# Patient Record
Sex: Male | Born: 1985 | Race: White | Hispanic: No | Marital: Married | Smoking: Current every day smoker
Health system: Southern US, Community
[De-identification: ages and names within clinical notes are randomized; demographics above are authoritative.]

## PROBLEM LIST (undated history)

## (undated) DIAGNOSIS — F419 Anxiety disorder, unspecified: Secondary | ICD-10-CM

## (undated) DIAGNOSIS — M1990 Unspecified osteoarthritis, unspecified site: Secondary | ICD-10-CM

## (undated) DIAGNOSIS — R51 Headache: Secondary | ICD-10-CM

## (undated) DIAGNOSIS — L0291 Cutaneous abscess, unspecified: Secondary | ICD-10-CM

## (undated) DIAGNOSIS — R519 Headache, unspecified: Secondary | ICD-10-CM

## (undated) DIAGNOSIS — Z22322 Carrier or suspected carrier of Methicillin resistant Staphylococcus aureus: Secondary | ICD-10-CM

## (undated) DIAGNOSIS — K4020 Bilateral inguinal hernia, without obstruction or gangrene, not specified as recurrent: Secondary | ICD-10-CM

## (undated) HISTORY — DX: Unspecified osteoarthritis, unspecified site: M19.90

## (undated) HISTORY — PX: NO PAST SURGERIES: SHX2092

## (undated) HISTORY — DX: Bilateral inguinal hernia, without obstruction or gangrene, not specified as recurrent: K40.20

---

## 2008-08-07 ENCOUNTER — Ambulatory Visit: Payer: Self-pay | Admitting: Internal Medicine

## 2008-08-07 DIAGNOSIS — M2550 Pain in unspecified joint: Secondary | ICD-10-CM | POA: Insufficient documentation

## 2008-08-07 LAB — CONVERTED CEMR LAB
ALT: 28 units/L (ref 0–53)
AST: 29 units/L (ref 0–37)
Albumin: 4.4 g/dL (ref 3.5–5.2)
BUN: 8 mg/dL (ref 6–23)
Basophils Relative: 0.6 % (ref 0.0–3.0)
Chloride: 102 meq/L (ref 96–112)
Creatinine, Ser: 0.9 mg/dL (ref 0.4–1.5)
Eosinophils Absolute: 0.3 10*3/uL (ref 0.0–0.7)
Eosinophils Relative: 4.5 % (ref 0.0–5.0)
GFR calc non Af Amer: 111 mL/min
Glucose, Bld: 96 mg/dL (ref 70–99)
HCT: 39.4 % (ref 39.0–52.0)
MCV: 93.2 fL (ref 78.0–100.0)
Monocytes Relative: 7.9 % (ref 3.0–12.0)
Neutrophils Relative %: 50.4 % (ref 43.0–77.0)
RBC: 4.22 M/uL (ref 4.22–5.81)
Rhuematoid fact SerPl-aCnc: <20 intl units/mL — ABNORMAL LOW (ref 0.0–20.0)
Total Protein: 6.8 g/dL (ref 6.0–8.3)
WBC: 5.8 10*3/uL (ref 4.5–10.5)

## 2008-09-04 ENCOUNTER — Ambulatory Visit: Payer: Self-pay | Admitting: Internal Medicine

## 2008-09-18 ENCOUNTER — Encounter: Payer: Self-pay | Admitting: Internal Medicine

## 2008-09-24 ENCOUNTER — Ambulatory Visit: Payer: Self-pay | Admitting: Internal Medicine

## 2008-09-24 DIAGNOSIS — J069 Acute upper respiratory infection, unspecified: Secondary | ICD-10-CM | POA: Insufficient documentation

## 2008-11-19 ENCOUNTER — Ambulatory Visit: Payer: Self-pay | Admitting: Internal Medicine

## 2008-11-19 DIAGNOSIS — F17200 Nicotine dependence, unspecified, uncomplicated: Secondary | ICD-10-CM | POA: Insufficient documentation

## 2009-03-04 ENCOUNTER — Ambulatory Visit: Payer: Self-pay | Admitting: Family Medicine

## 2009-04-11 ENCOUNTER — Ambulatory Visit: Payer: Self-pay | Admitting: Internal Medicine

## 2009-04-11 DIAGNOSIS — L255 Unspecified contact dermatitis due to plants, except food: Secondary | ICD-10-CM

## 2009-05-07 ENCOUNTER — Ambulatory Visit: Payer: Self-pay | Admitting: Internal Medicine

## 2009-05-07 DIAGNOSIS — G5600 Carpal tunnel syndrome, unspecified upper limb: Secondary | ICD-10-CM | POA: Insufficient documentation

## 2009-06-13 ENCOUNTER — Encounter (INDEPENDENT_AMBULATORY_CARE_PROVIDER_SITE_OTHER): Payer: Self-pay | Admitting: *Deleted

## 2009-06-13 ENCOUNTER — Ambulatory Visit: Payer: Self-pay | Admitting: Internal Medicine

## 2010-02-20 ENCOUNTER — Telehealth: Payer: Self-pay | Admitting: Internal Medicine

## 2010-11-25 NOTE — Progress Notes (Signed)
Summary: patches  Phone Note Call from Patient   Caller: Patient Call For: Gordy Savers  MD Summary of Call: Going on a cruise in may (10 days), and needs seasick patches [facility]) [phone] Initial call taken by: Lynann Beaver CMA,  February 20, 2010 9:12 AM  Follow-up for Phone Call        transderm scop  #3  use q 72 h Follow-up by: Gordy Savers  MD,  February 20, 2010 12:34 PM    New/Updated Medications: TRANSDERM-SCOP 1.5 MG PT72 (SCOPOLAMINE BASE) Apply q 72 hours Prescriptions: TRANSDERM-SCOP 1.5 MG PT72 (SCOPOLAMINE BASE) Apply q 72 hours  #3 x 0   Entered by:   Lynann Beaver CMA   Authorized by:   Gordy Savers  MD   Signed by:   Lynann Beaver CMA on 02/20/2010   Method used:   [facility] to        [facility] [phone]* (retail)       [address]       [place], [place]  [address]       Ph: [phone] or [phone]       Fax: [phone]   RxID:   [id number]

## 2011-10-03 ENCOUNTER — Emergency Department (HOSPITAL_COMMUNITY)
Admission: EM | Admit: 2011-10-03 | Discharge: 2011-10-03 | Disposition: A | Discharge status: Home / Self Care | Payer: BC Managed Care – PPO | Source: Home / Self Care

## 2011-10-03 DIAGNOSIS — L02219 Cutaneous abscess of trunk, unspecified: Secondary | ICD-10-CM

## 2011-10-03 MED ORDER — SULFAMETHOXAZOLE-TRIMETHOPRIM 800-160 MG PO TABS: 1.0000 | ORAL_TABLET | Freq: Two times a day (BID) | ORAL | Status: AC

## 2011-10-03 MED ORDER — HYDROCODONE-ACETAMINOPHEN 5-325 MG PO TABS: 1.0000 | ORAL_TABLET | ORAL | Status: AC | PRN

## 2011-10-03 NOTE — ED Provider Notes (Signed)
History     CSN: [id number] Arrival date & time: 10/03/2011  1:24 PM   None     Chief Complaint  Patient presents with  . Abscess    Pt has red painful lesion on lt side for three days, states he has these numerous times on various places on body    (Consider location/radiation/quality/duration/timing/severity/associated sxs/prior treatment) Patient is a 25 y.o. male presenting with abscess. The history is provided by the patient.  Abscess  This is a new problem. Episode onset: 2 days ago. The onset was gradual. The problem has been rapidly worsening. The abscess is present on the trunk. The problem is moderate. The abscess is characterized by redness and swelling. The abscess first occurred at home. Pertinent negatives include no fever. Sick contacts: someone else at work also has had abscess recently.    History reviewed. No pertinent past medical history.  History reviewed. No pertinent past surgical history.  History reviewed. No pertinent family history.  History  Substance Use Topics  . Smoking status: Current Everyday Smoker    Types: Cigarettes  . Smokeless tobacco: Not on file  . Alcohol Use: Yes      Review of Systems  Constitutional: Negative for fever and chills.  Skin: Positive for color change.       Abscess    Allergies  Review of patient's allergies indicates no known allergies.  Home Medications   Current Outpatient Rx  Name Route Sig Dispense Refill  . TYLENOL EXTRA STRENGTH PO Oral Take by mouth.      Marland Kitchen HYDROCODONE-ACETAMINOPHEN 5-325 MG PO TABS Oral Take 1-2 tablets by mouth every 4 (four) hours as needed for pain. 20 tablet 0  . SULFAMETHOXAZOLE-TRIMETHOPRIM 800-160 MG PO TABS Oral Take 1 tablet by mouth 2 (two) times daily. 20 tablet 0    BP 109/56  Pulse 73  Temp(Src) 98.3 F (36.8 C) (Oral)  Resp 17  SpO2 100%  Physical Exam  Constitutional: He appears well-developed and well-nourished. No distress.  Pulmonary/Chest: Effort  normal.  Skin: Skin is warm and dry.       ED Course  INCISION AND DRAINAGE Date/Time: 10/03/2011 2:10 PM Performed by: Cathlyn Parsons Authorized by: Cathlyn Parsons Consent: Verbal consent obtained. Written consent not obtained. Consent given by: patient Patient understanding: patient states understanding of the procedure being performed Patient identity confirmed: verbally with patient Type: abscess Body area: trunk Location details: chest Anesthesia: local infiltration Local anesthetic: lidocaine 2% without epinephrine Anesthetic total: 1 ml Patient sedated: no Scalpel size: 11 Incision type: single straight Complexity: simple Drainage: purulent Drainage amount: moderate Wound treatment: wound left open Packing material: 1/4 in iodoform gauze Patient tolerance: Patient tolerated the procedure well with no immediate complications.   (including critical care time)   Labs Reviewed  CULTURE, ROUTINE-ABSCESS   No results found.   1. Abscess of trunk       MDM          Cathlyn Parsons, NP 10/03/11 1423

## 2011-10-06 LAB — CULTURE, ROUTINE-ABSCESS: Special Requests: NORMAL

## 2011-10-07 ENCOUNTER — Telehealth (HOSPITAL_COMMUNITY): Payer: Self-pay | Admitting: *Deleted

## 2011-10-07 NOTE — ED Provider Notes (Signed)
Medical screening examination/treatment/procedure(s) were performed by non-physician practitioner and as supervising physician I was immediately available for consultation/collaboration.   Barkley Bruns MD.    Barkley Bruns, MD 10/07/11 2133

## 2011-10-07 NOTE — ED Notes (Signed)
Labs and medications for 12/8 reviewed.  Pt. pos. for MRSA and was adequately treated with Septra. Left message for pt. to call. Vassie Moselle 10/07/2011

## 2011-10-08 ENCOUNTER — Telehealth (HOSPITAL_COMMUNITY): Payer: Self-pay | Admitting: *Deleted

## 2011-10-09 ENCOUNTER — Telehealth (HOSPITAL_COMMUNITY): Payer: Self-pay | Admitting: *Deleted

## 2011-10-09 NOTE — ED Notes (Signed)
12/12 Labs and medications reviewed for 12/8.  Abscess culture L chest : Abundant MRSA. Adequately treated with Septra. Ryan Spencer 10/09/2011

## 2012-04-11 ENCOUNTER — Emergency Department (HOSPITAL_COMMUNITY)
Admission: EM | Admit: 2012-04-11 | Discharge: 2012-04-12 | Disposition: A | Discharge status: Home / Self Care | Payer: BC Managed Care – PPO | Attending: Emergency Medicine | Admitting: Emergency Medicine

## 2012-04-11 ENCOUNTER — Encounter (HOSPITAL_COMMUNITY): Payer: Self-pay | Admitting: *Deleted

## 2012-04-11 DIAGNOSIS — L02219 Cutaneous abscess of trunk, unspecified: Secondary | ICD-10-CM | POA: Insufficient documentation

## 2012-04-11 DIAGNOSIS — L0291 Cutaneous abscess, unspecified: Secondary | ICD-10-CM

## 2012-04-11 HISTORY — DX: Carrier or suspected carrier of methicillin resistant Staphylococcus aureus: Z22.322

## 2012-04-11 HISTORY — DX: Cutaneous abscess, unspecified: L02.91

## 2012-04-11 MED ORDER — ONDANSETRON HCL 4 MG/2ML IJ SOLN
4.0000 mg | Freq: Once | INTRAMUSCULAR | Status: AC
  Administered 2012-04-11: 4 mg via INTRAVENOUS
  Filled 2012-04-11: qty 2

## 2012-04-11 MED ORDER — CLINDAMYCIN PHOSPHATE 600 MG/50ML IV SOLN
600.0000 mg | Freq: Once | INTRAVENOUS | Status: AC
  Administered 2012-04-12: 600 mg via INTRAVENOUS
  Filled 2012-04-11: qty 50

## 2012-04-11 MED ORDER — MORPHINE SULFATE 4 MG/ML IJ SOLN
4.0000 mg | Freq: Once | INTRAMUSCULAR | Status: AC
  Administered 2012-04-11: 4 mg via INTRAVENOUS
  Filled 2012-04-11: qty 1

## 2012-04-11 MED ORDER — SODIUM CHLORIDE 0.9 % IV BOLUS (SEPSIS)
1000.0000 mL | Freq: Once | INTRAVENOUS | Status: AC
  Administered 2012-04-12: 1000 mL via INTRAVENOUS

## 2012-04-11 NOTE — ED Provider Notes (Signed)
History     CSN: [id number]  Arrival date & time 04/11/12  1927   First MD Initiated Contact with Patient 04/11/12 2252      Chief Complaint  Patient presents with  . Wound Infection    (Consider location/radiation/quality/duration/timing/severity/associated sxs/prior treatment) HPI  Patient presents to the ER with complaints of abdominal abscess. He was sent from the Urgent care who performed and I and D yesterday. He has been taking Doxycycline times 2 doses. When he came back to the UC to have the packing replaced, they noted increased redness and pt still have discharge from asbcess.. The patient denies increased pain, fever,s nausea, diarrhea, vomiting, abdominal pain (aside from abscess site).  Past Medical History  Diagnosis Date  . MRSA (methicillin resistant staph aureus) culture positive   . Abscess     No past surgical history on file.  No family history on file.  History  Substance Use Topics  . Smoking status: Not on file  . Smokeless tobacco: Not on file  . Alcohol Use:       Review of Systems   HEENT: denies blurry vision or change in hearing PULMONARY: Denies difficulty breathing and SOB CARDIAC: denies chest pain or heart palpitations MUSCULOSKELETAL:  denies being unable to ambulate ABDOMEN AL: denies abdominal pain GU: denies loss of bowel or urinary control NEURO: denies numbness and tingling in extremities SKIN: worsening rash PSYCH: patient behavior is normal NECK: Not complaining of neck pain     Allergies  Review of patient's allergies indicates no known allergies.  Home Medications   Current Outpatient Rx  Name Route Sig Dispense Refill  . DOXYCYCLINE HYCLATE 100 MG PO TABS Oral Take 100 mg by mouth 2 (two) times daily.    Marland Kitchen HYDROCODONE-ACETAMINOPHEN 5-325 MG PO TABS Oral Take 1 tablet by mouth every 6 (six) hours as needed. Pain    . ONDANSETRON HCL 4 MG PO TABS Oral Take 1 tablet (4 mg total) by mouth every 6 (six) hours. 12  tablet 0  . OXYCODONE-ACETAMINOPHEN 5-325 MG PO TABS Oral Take 1 tablet by mouth every 6 (six) hours as needed for pain. 15 tablet 0    BP 128/62  Pulse 68  Temp 98.7 F (37.1 C) (Oral)  Resp 16  Ht 5\' 11"  (1.803 m)  Wt 155 lb (70.308 kg)  BMI 21.62 kg/m2  SpO2 99%  Physical Exam  Nursing note and vitals reviewed. Constitutional: He appears well-developed and well-nourished. No distress.  HENT:  Head: Normocephalic and atraumatic.  Eyes: Pupils are equal, round, and reactive to light.  Neck: Normal range of motion. Neck supple.  Cardiovascular: Normal rate and regular rhythm.   Pulmonary/Chest: Effort normal.  Abdominal: Soft.  Neurological: He is alert.  Skin: Skin is warm and dry. Rash noted.          Patient has abscess to LLQ abdomen. Patient has had previous I&D but incision was very small and not large enough for the size of the abscess.  The patient also has surrounding cellulitis that is mild.     ED Course  Procedures (including critical care time)  Labs Reviewed  CBC - Abnormal; Notable for the following:    RBC 3.88 (*)     Hemoglobin 12.0 (*)     HCT 35.9 (*)     All other components within normal limits  BASIC METABOLIC PANEL - Abnormal; Notable for the following:    Glucose, Bld 100 (*)     All other  components within normal limits  DIFFERENTIAL   No results found.   1. Abscess       MDM    INCISION AND DRAINAGE Performed by: Dorthula Matas Consent: Verbal consent obtained. Risks and benefits: risks, benefits and alternatives were discussed Type: abscess  Body area: LLQ abdomen  Anesthesia: local infiltration  Local anesthetic: lidocaine 2% with epinephrine  Anesthetic total: 3 ml  Complexity: complex Blunt dissection to break up loculations  Drainage: purulent  Drainage amount: large amount  Packing material: 1/4 in iodoform gauze  Patient tolerance: Patient tolerated the procedure well with no immediate  complications.   Patient given IV abx of Clindamycin in ED he is taking Doxycyline at home and has only taken it for one day. Therefore I think the patient can continue to take it.  I am having the patient return in 48 hours for packing change or sooner if redness spreads.   Pt has been advised of the symptoms that warrant their return to the ED. Patient has voiced understanding and has agreed to follow-up with the PCP or specialist.         Dorthula Matas, PA 04/12/12 [phone]

## 2012-04-11 NOTE — ED Notes (Signed)
Pt here s/p I&D performed yesterday at [facility] on LLQ of abd. Pt reports today large amt of drainage and increased redness to wound site, pt went back to [facility] today, staff took out packing and was referred to ED for further eval. Pt also admits to increased abd pain/left hip pain. Pt denies any fever, n/v - however does admit to some weakness.

## 2012-04-12 LAB — BASIC METABOLIC PANEL
CO2: 28 mEq/L (ref 19–32)
Calcium: 9.5 mg/dL (ref 8.4–10.5)
GFR calc non Af Amer: >90 mL/min (ref >90)
Sodium: 138 mEq/L (ref 135–145)

## 2012-04-12 LAB — CBC
MCV: 92.5 fL (ref 78.0–100.0)
Platelets: 195 10*3/uL (ref 150–400)
RDW: 13.3 % (ref 11.5–15.5)
WBC: 8.4 10*3/uL (ref 4.0–10.5)

## 2012-04-12 LAB — DIFFERENTIAL
Basophils Absolute: 0 10*3/uL (ref 0.0–0.1)
Eosinophils Relative: 3 % (ref 0–5)
Lymphocytes Relative: 37 % (ref 12–46)
Neutrophils Relative %: 50 % (ref 43–77)

## 2012-04-12 MED ORDER — OXYCODONE-ACETAMINOPHEN 5-325 MG PO TABS: 1.0000 | ORAL_TABLET | Freq: Four times a day (QID) | ORAL | Status: DC | PRN

## 2012-04-12 MED ORDER — ONDANSETRON HCL 4 MG PO TABS: 4.0000 mg | ORAL_TABLET | Freq: Four times a day (QID) | ORAL | Status: DC

## 2012-04-12 MED ORDER — OXYCODONE-ACETAMINOPHEN 5-325 MG PO TABS
1.0000 | ORAL_TABLET | Freq: Once | ORAL | Status: AC
  Administered 2012-04-12: 1 via ORAL
  Filled 2012-04-12: qty 1

## 2012-04-12 NOTE — ED Provider Notes (Signed)
Medical screening examination/treatment/procedure(s) were performed by non-physician practitioner and as supervising physician I was immediately available for consultation/collaboration.   Andy Allende L Letasha Kershaw, MD 04/12/12 0827 

## 2012-04-12 NOTE — Discharge Instructions (Signed)

## 2012-04-14 ENCOUNTER — Emergency Department (HOSPITAL_COMMUNITY)
Admission: EM | Admit: 2012-04-14 | Discharge: 2012-04-14 | Disposition: A | Discharge status: Home / Self Care | Payer: BC Managed Care – PPO | Attending: Emergency Medicine | Admitting: Emergency Medicine

## 2012-04-14 ENCOUNTER — Encounter (HOSPITAL_COMMUNITY): Payer: Self-pay | Admitting: Emergency Medicine

## 2012-04-14 DIAGNOSIS — L02219 Cutaneous abscess of trunk, unspecified: Secondary | ICD-10-CM | POA: Insufficient documentation

## 2012-04-14 DIAGNOSIS — Z8614 Personal history of Methicillin resistant Staphylococcus aureus infection: Secondary | ICD-10-CM | POA: Insufficient documentation

## 2012-04-14 DIAGNOSIS — L02211 Cutaneous abscess of abdominal wall: Secondary | ICD-10-CM

## 2012-04-14 DIAGNOSIS — L03319 Cellulitis of trunk, unspecified: Secondary | ICD-10-CM | POA: Insufficient documentation

## 2012-04-14 NOTE — ED Notes (Signed)
Pt states he had an abominal abscess drained on Monday and is here for a recheck and packing removal today

## 2012-04-14 NOTE — ED Notes (Signed)
PA Kirichenko at bedside 

## 2012-04-14 NOTE — ED Provider Notes (Signed)
History     CSN: [id number]  Arrival date & time 04/14/12  1191   First MD Initiated Contact with Patient 04/14/12 [phone]      Chief Complaint  Patient presents with  . Wound Check    (Consider location/radiation/quality/duration/timing/severity/associated sxs/prior treatment) Patient is a 26 y.o. male presenting with wound check. The history is provided by the patient.  Wound Check  He was treated in the ED 2 to 3 days ago. Previous treatment in the ED includes I&D of abscess. Treatments since wound repair include oral antibiotics. There has been bloody discharge from the wound. The redness has improved. The swelling has improved. The pain has improved.  Pt had an abscess I&Ded two days ago, was told to return for recheck and packing removal. Pt states abscess feeling better, and surrounding redness and pain almost resolved. Pt has hx of abscess MRSA positive. Pt denies fever, chills, malaise.   Past Medical History  Diagnosis Date  . MRSA (methicillin resistant staph aureus) culture positive   . Abscess     History reviewed. No pertinent past surgical history.  History reviewed. No pertinent family history.  History  Substance Use Topics  . Smoking status: Not on file  . Smokeless tobacco: Not on file  . Alcohol Use: No      Review of Systems  Constitutional: Negative for fever and chills.  Respiratory: Negative.   Cardiovascular: Negative.   Skin: Positive for wound.  Neurological: Negative for dizziness, light-headedness and headaches.    Allergies  Review of patient's allergies indicates no known allergies.  Home Medications   Current Outpatient Rx  Name Route Sig Dispense Refill  . DOXYCYCLINE HYCLATE 100 MG PO TABS Oral Take 100 mg by mouth 2 (two) times daily.    Marland Kitchen HYDROCODONE-ACETAMINOPHEN 5-325 MG PO TABS Oral Take 1 tablet by mouth every 6 (six) hours as needed. Pain      BP 113/57  Pulse 54  Temp 98.1 F (36.7 C) (Oral)  Resp 16  SpO2  100%  Physical Exam  Nursing note and vitals reviewed. Constitutional: He appears well-developed and well-nourished. No distress.  HENT:  Head: Normocephalic.  Eyes: Conjunctivae are normal.  Cardiovascular: Normal rate, regular rhythm and normal heart sounds.   Pulmonary/Chest: Effort normal and breath sounds normal. No respiratory distress. He has no wheezes. He has no rales.  Neurological: He is alert.  Skin:       There is about a 2cm incision to the left lower abdomen over a previous abscess. Packing intact. Some serosanguinous drainage noted. No surrounding cellulitis or induration. Mild tenderness with palpation    ED Course  Procedures (including critical care time)  Pt with an abscess I&Ded and packed two days ago. On doxycycline. States it is improving. I removed the packing, which was pretty dense in the wound, tissue appears healthy, no purulent drainage noted. Abscess quiet deep, about 2cm deep. I then packed it lightly with about 2cm of packing maetrial, dressing applied. Instructed pt to continue antibiotics, follow up as needed. He can return here in 48hrs, follow up with his doctor, or pull packing out himself.   Pt afebrile, non toxic appearing, ready for discharge.   1. Cutaneous abscess of abdominal wall       MDM          Lottie Mussel, PA 04/14/12 [phone]

## 2012-04-14 NOTE — Discharge Instructions (Signed)
Continue to keep area clean. Continue antibiotics. You can do warm compresses several times a day. In 48hrs, you can return for follow up or remove packing yourself. Follow  Up as needed.   Abscess Care After An abscess (also called a boil or furuncle) is an infected area that contains a collection of pus. Signs and symptoms of an abscess include pain, tenderness, redness, or hardness, or you may feel a moveable soft area under your skin. An abscess can occur anywhere in the body. The infection may spread to surrounding tissues causing cellulitis. A cut (incision) by the surgeon was made over your abscess and the pus was drained out. Gauze may have been packed into the space to provide a drain that will allow the cavity to heal from the inside outwards. The boil may be painful for 5 to 7 days. Most people with a boil do not have high fevers. Your abscess, if seen early, may not have localized, and may not have been lanced. If not, another appointment may be required for this if it does not get better on its own or with medications. HOME CARE INSTRUCTIONS   Only take over-the-counter or prescription medicines for pain, discomfort, or fever as directed by your caregiver.   When you bathe, soak and then remove gauze or iodoform packs at least daily or as directed by your caregiver. You may then wash the wound gently with mild soapy water. Repack with gauze or do as your caregiver directs.  SEEK IMMEDIATE MEDICAL CARE IF:   You develop increased pain, swelling, redness, drainage, or bleeding in the wound site.   You develop signs of generalized infection including muscle aches, chills, fever, or a general ill feeling.   An oral temperature above 102 F (38.9 C) develops, not controlled by medication.  See your caregiver for a recheck if you develop any of the symptoms described above. If medications (antibiotics) were prescribed, take them as directed. Document Released: 04/30/2005 Document Revised:  10/01/2011 Document Reviewed: 12/26/2007 [facility] Patient Information 2012 [place], [place].

## 2012-04-14 NOTE — ED Provider Notes (Signed)
Medical screening examination/treatment/procedure(s) were performed by non-physician practitioner and as supervising physician I was immediately available for consultation/collaboration.   Celene Kras, MD 04/14/12 [phone]

## 2012-04-14 NOTE — ED Notes (Signed)
Pt here for re-check and packing removal to abcess in mid lower abd. Pt states the area is less swollen now. Pt states the area is still a little painful. Area red with some swelling. Wound covered with abdo pad.

## 2012-05-27 ENCOUNTER — Encounter: Payer: Self-pay | Admitting: Internal Medicine

## 2012-05-27 ENCOUNTER — Ambulatory Visit (INDEPENDENT_AMBULATORY_CARE_PROVIDER_SITE_OTHER): Payer: BC Managed Care – PPO | Admitting: Internal Medicine

## 2012-05-27 VITALS — BP 118/74 | Temp 98.7°F | Wt 151.0 lb

## 2012-05-27 DIAGNOSIS — L0390 Cellulitis, unspecified: Secondary | ICD-10-CM

## 2012-05-27 DIAGNOSIS — L0291 Cutaneous abscess, unspecified: Secondary | ICD-10-CM

## 2012-05-27 NOTE — Patient Instructions (Signed)
Abscess  Care After  An abscess (also called a boil or furuncle) is an infected area that contains a collection of pus. Signs and symptoms of an abscess include pain, tenderness, redness, or hardness, or you may feel a moveable soft area under your skin. An abscess can occur anywhere in the body. The infection may spread to surrounding tissues causing cellulitis. A cut (incision) by the surgeon was made over your abscess and the pus was drained out. Gauze may have been packed into the space to provide a drain that will allow the cavity to heal from the inside outwards. The boil may be painful for 5 to 7 days. Most people with a boil do not have high fevers. Your abscess, if seen early, may not have localized, and may not have been lanced. If not, another appointment may be required for this if it does not get better on its own or with medications.  HOME CARE INSTRUCTIONS     Only take over-the-counter or prescription medicines for pain, discomfort, or fever as directed by your caregiver.    When you bathe, soak and then remove gauze or iodoform packs at least daily or as directed by your caregiver. You may then wash the wound gently with mild soapy water. Repack with gauze or do as your caregiver directs.   SEEK IMMEDIATE MEDICAL CARE IF:     You develop increased pain, swelling, redness, drainage, or bleeding in the wound site.    You develop signs of generalized infection including muscle aches, chills, fever, or a general ill feeling.    An oral temperature above 102 F (38.9 C) develops, not controlled by medication.   See your caregiver for a recheck if you develop any of the symptoms described above. If medications (antibiotics) were prescribed, take them as directed.  Document Released: 04/30/2005 Document Revised: 10/01/2011 Document Reviewed: 12/26/2007  [employer] Patient Information 2012 [employer].

## 2012-05-27 NOTE — Progress Notes (Signed)
  Subjective:    Patient ID: Ryan Spencer, male    DOB: 12-Oct-1986, 26 y.o.   MRN: [id number]  HPI 26 year old patient who is seen today with a chief complaint of an enlarging abscess involving his left anterior thigh. He has required I&D in the past and does have a history of MRSA. For the past 2 days he has been using doxycycline which he he had left over from a prior episode. Due to the increasing pain and swelling he presented for evaluation today     Review of Systems  Skin: Positive for wound.       Objective:   Physical Exam  Skin: Skin is warm. Rash noted. There is erythema.       A 4 cm erythematous tender furuncle  noted left mid anterior thigh          Assessment & Plan:   Abscess cellulitis left anterior thigh.  Procedure note. After Betadine prep and local anesthesia with 1% Xylocaine a 2 cm incision was made and the abscess drained; blunt dissection was undertaken; antibiotic ointment applied and the wound dressed. Local wound care discussed

## 2012-05-29 ENCOUNTER — Encounter (HOSPITAL_COMMUNITY): Payer: Self-pay

## 2012-05-29 ENCOUNTER — Emergency Department (HOSPITAL_COMMUNITY)
Admission: EM | Admit: 2012-05-29 | Discharge: 2012-05-29 | Disposition: A | Discharge status: Home / Self Care | Payer: BC Managed Care – PPO | Source: Home / Self Care | Attending: Emergency Medicine | Admitting: Emergency Medicine

## 2012-05-29 DIAGNOSIS — L0390 Cellulitis, unspecified: Secondary | ICD-10-CM

## 2012-05-29 DIAGNOSIS — L0291 Cutaneous abscess, unspecified: Secondary | ICD-10-CM

## 2012-05-29 MED ORDER — MUPIROCIN CALCIUM 2 % NA OINT: TOPICAL_OINTMENT | NASAL | Status: DC

## 2012-05-29 MED ORDER — HYDROCODONE-ACETAMINOPHEN 5-325 MG PO TABS: 1.0000 | ORAL_TABLET | Freq: Four times a day (QID) | ORAL | Status: DC | PRN

## 2012-05-29 MED ORDER — SULFAMETHOXAZOLE-TRIMETHOPRIM 800-160 MG PO TABS: 1.0000 | ORAL_TABLET | Freq: Two times a day (BID) | ORAL | Status: AC

## 2012-05-29 MED ORDER — HYDROCODONE-ACETAMINOPHEN 5-325 MG PO TABS
ORAL_TABLET | ORAL | Status: AC
  Filled 2012-05-29: qty 2

## 2012-05-29 MED ORDER — HYDROCODONE-ACETAMINOPHEN 5-325 MG PO TABS
2.0000 | ORAL_TABLET | Freq: Once | ORAL | Status: AC
  Administered 2012-05-29: 2 via ORAL

## 2012-05-29 NOTE — ED Notes (Signed)
abcess on left upper thigh, states went to doctor on Friday and had and I&D. Was not given antibiotics. Pt states has increase in redness, pain, fever and chills

## 2012-05-29 NOTE — ED Provider Notes (Signed)
History     CSN: [id number]  Arrival date & time 05/29/12  1410   None     Chief Complaint  Patient presents with  . Wound Infection    leg abcess    (Consider location/radiation/quality/duration/timing/severity/associated sxs/prior treatment) HPI Comments: Pt with recurrent abscesses in varying locations for last year.  Latest started on L thigh 4 days ago. Saw pcp 2 days ago who I&D abscess, no rx for antibiotics, no packing.  Pt reports area getting worse, continues to drain pus, pt c/o fever.   Patient is a 26 y.o. male presenting with abscess. The history is provided by the patient.  Abscess  This is a new problem. Episode onset: 4 days ago. The problem occurs continuously. The problem has been rapidly worsening. The abscess is present on the left upper leg. The problem is severe. The abscess is characterized by redness, swelling, draining and painfulness. It is unknown what he was exposed to. Associated symptoms include a fever.    Past Medical History  Diagnosis Date  . MRSA (methicillin resistant staph aureus) culture positive   . Abscess     History reviewed. No pertinent past surgical history.  Family History  Problem Relation Age of Onset  . Arthritis Mother     History  Substance Use Topics  . Smoking status: Current Everyday Smoker    Types: Cigarettes  . Smokeless tobacco: Not on file  . Alcohol Use: No      Review of Systems  Constitutional: Positive for fever.  Skin: Positive for color change and wound.    Allergies  Review of patient's allergies indicates no known allergies.  Home Medications   Current Outpatient Rx  Name Route Sig Dispense Refill  . HYDROCODONE-ACETAMINOPHEN 5-325 MG PO TABS Oral Take 1 tablet by mouth every 6 (six) hours as needed for pain. 30 tablet 0  . MUPIROCIN CALCIUM 2 % NA OINT  Apply in each nostril daily for 30 days 1 g 0  . SULFAMETHOXAZOLE-TRIMETHOPRIM 800-160 MG PO TABS Oral Take 1 tablet by mouth every 12  (twelve) hours. 20 tablet 0    BP 108/44  Pulse 62  Temp 99.4 F (37.4 C) (Oral)  Resp 18  SpO2 100%  Physical Exam  Constitutional: He appears well-developed and well-nourished. No distress.  Pulmonary/Chest: Effort normal.  Skin: Skin is warm and dry. There is erythema.          Wound irrigated with saline, more pus expressed, irrigation repeated until no further pus produced.  Wound packed with 1/4" iodoform gauze and covered with gauze and tape.     ED Course  Procedures (including critical care time)  Labs Reviewed - No data to display No results found.   1. Abscess       MDM  Pt to f/u with pcp for wound check. Discussed with pt how he has been getting recurrent abscesses in last year, and need for nasal bactroban tx.         Cathlyn Parsons, NP 06/03/12 2103

## 2012-05-30 ENCOUNTER — Telehealth: Payer: Self-pay | Admitting: Internal Medicine

## 2012-05-30 NOTE — Telephone Encounter (Signed)
Faxed note for friday appt.

## 2012-05-30 NOTE — Telephone Encounter (Signed)
Pt needs work note stating he was at doctor office on 05-27-2012. Please fax to attn [facility] [phone]

## 2012-05-31 ENCOUNTER — Ambulatory Visit (INDEPENDENT_AMBULATORY_CARE_PROVIDER_SITE_OTHER): Payer: BC Managed Care – PPO | Admitting: Internal Medicine

## 2012-05-31 ENCOUNTER — Encounter: Payer: Self-pay | Admitting: Internal Medicine

## 2012-05-31 VITALS — BP 100/62 | Temp 98.1°F | Wt 150.0 lb

## 2012-05-31 DIAGNOSIS — L0291 Cutaneous abscess, unspecified: Secondary | ICD-10-CM

## 2012-05-31 MED ORDER — HYDROCODONE-ACETAMINOPHEN 5-325 MG PO TABS: 1.0000 | ORAL_TABLET | Freq: Four times a day (QID) | ORAL | Status: DC | PRN

## 2012-05-31 NOTE — Patient Instructions (Addendum)
Leave packing in place as long as possible  Take your antibiotic as prescribed until ALL of it is gone, but stop if you develop a rash, swelling, or any side effects of the medication.  Contact our office as soon as possible if  there are side effects of the medication.  Call or return to clinic prn if these symptoms worsen or fail to improve as anticipated.   Abscess Care After An abscess (also called a boil or furuncle) is an infected area that contains a collection of pus. Signs and symptoms of an abscess include pain, tenderness, redness, or hardness, or you may feel a moveable soft area under your skin. An abscess can occur anywhere in the body. The infection may spread to surrounding tissues causing cellulitis. A cut (incision) by the surgeon was made over your abscess and the pus was drained out. Gauze may have been packed into the space to provide a drain that will allow the cavity to heal from the inside outwards. The boil may be painful for 5 to 7 days. Most people with a boil do not have high fevers. Your abscess, if seen early, may not have localized, and may not have been lanced. If not, another appointment may be required for this if it does not get better on its own or with medications. HOME CARE INSTRUCTIONS    Only take over-the-counter or prescription medicines for pain, discomfort, or fever as directed by your caregiver.   When you bathe, soak and then remove gauze or iodoform packs at least daily or as directed by your caregiver. You may then wash the wound gently with mild soapy water. Repack with gauze or do as your caregiver directs.  SEEK IMMEDIATE MEDICAL CARE IF:    You develop increased pain, swelling, redness, drainage, or bleeding in the wound site.   You develop signs of generalized infection including muscle aches, chills, fever, or a general ill feeling.   An oral temperature above 102 F (38.9 C) develops, not controlled by medication.  See your caregiver for a  recheck if you develop any of the symptoms described above. If medications (antibiotics) were prescribed, take them as directed. Document Released: 04/30/2005 Document Revised: 10/01/2011 Document Reviewed: 12/26/2007 [facility] Patient Information 2012 [place], [place].

## 2012-05-31 NOTE — Progress Notes (Signed)
  Subjective:    Patient ID: Ryan Spencer, male    DOB: 1986/09/05, 26 y.o.   MRN: [id number]  HPI  26 year old patient who is seen today for followup of a left anterior thigh abscess. He was seen here 4 days ago and this abscess was I&D. It was felt that he had doxycycline on hand but apparently this was amoxicillin. 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 Due to worsening pain and swelling he was seen at the urgent care 2 days ago and the wound was packed. He continues to have drainage from the site. Antibiotic therapy has been switched to Septra DS. Culture report pending. He is still somewhat uncomfortable but the erythema has improved.     Review of Systems  Skin: Positive for wound.       Objective:   Physical Exam  Skin:       There is only minimal erythema about the wound involving the left anterior thigh area. Packing still in place          Assessment & Plan:   Abscess left anterior thigh. History of MRSA. The packing was left in place. Wound care discussed. He'll complete antibiotic therapy. Will review results of the culture when available. Additional analgesics prescribed

## 2012-06-05 NOTE — ED Provider Notes (Signed)
Medical screening examination/treatment/procedure(s) were performed by non-physician practitioner and as supervising physician I was immediately available for consultation/collaboration.  Luiz Blare MD   Luiz Blare, MD 06/05/12 [phone]

## 2012-06-28 ENCOUNTER — Telehealth: Payer: Self-pay | Admitting: Family Medicine

## 2012-06-28 ENCOUNTER — Telehealth: Payer: Self-pay

## 2012-06-28 MED ORDER — CEPHALEXIN 500 MG PO CAPS: 500.0000 mg | ORAL_CAPSULE | Freq: Four times a day (QID) | ORAL | Status: AC

## 2012-06-28 MED ORDER — TRAMADOL HCL 50 MG PO TABS: 50.0000 mg | ORAL_TABLET | Freq: Four times a day (QID) | ORAL | Status: AC | PRN

## 2012-06-28 NOTE — Telephone Encounter (Signed)
Tramadol 50  #50  One every 6 hrs

## 2012-06-28 NOTE — Telephone Encounter (Signed)
Generic Keflex 500 #40 one 4 times a day. Office visit if there is not prompt improvement

## 2012-06-28 NOTE — Telephone Encounter (Signed)
Pt was in to see Dr. Kirtland Bouchard for thigh abscess in August. Now it seems he has one on his L foot. Has called Call a Nurse 3x today, with no call back. Wants to know if he needs appt, or if Dr. Kirtland Bouchard could call in abx first, to start. He is trying to miss as little work as possible. Please call to advise.

## 2012-06-28 NOTE — Telephone Encounter (Signed)
Please advise 

## 2012-06-28 NOTE — Telephone Encounter (Signed)
Attempt to call- VM - LMTCB if questions - left instructions that med is to be taken qid for 10day - must complete therapy - if in 5 days no inprovment - must be seen - if in 10 days not completely well - will need to be seen - it is very important to take med as rx'd .

## 2012-06-28 NOTE — Telephone Encounter (Signed)
abx called in for foot infection , pt aware how to take and if no improvement we are to see him . He is requesting some thing for pain since he has to work on his feet - please advise

## 2012-06-30 ENCOUNTER — Ambulatory Visit (INDEPENDENT_AMBULATORY_CARE_PROVIDER_SITE_OTHER): Payer: BC Managed Care – PPO | Admitting: Internal Medicine

## 2012-06-30 ENCOUNTER — Encounter: Payer: Self-pay | Admitting: Internal Medicine

## 2012-06-30 ENCOUNTER — Telehealth: Payer: Self-pay | Admitting: Internal Medicine

## 2012-06-30 VITALS — Temp 98.6°F | Wt 150.0 lb

## 2012-06-30 DIAGNOSIS — L02619 Cutaneous abscess of unspecified foot: Secondary | ICD-10-CM

## 2012-06-30 DIAGNOSIS — L03119 Cellulitis of unspecified part of limb: Secondary | ICD-10-CM

## 2012-06-30 MED ORDER — HYDROCODONE-ACETAMINOPHEN 5-500 MG PO TABS: 1.0000 | ORAL_TABLET | Freq: Three times a day (TID) | ORAL | Status: AC | PRN

## 2012-06-30 MED ORDER — MOXIFLOXACIN HCL 400 MG PO TABS: 400.0000 mg | ORAL_TABLET | Freq: Every day | ORAL | Status: AC

## 2012-06-30 NOTE — Telephone Encounter (Signed)
Left a message for pt to return call 

## 2012-06-30 NOTE — Telephone Encounter (Signed)
Can pt come in at 430pm today? Will work in at that time

## 2012-06-30 NOTE — Telephone Encounter (Signed)
Pt aware of appt at 4:30 on 06/30/12.

## 2012-06-30 NOTE — Patient Instructions (Signed)
Antibiotic therapy was prescribed for the child due to specific medical indications.  Cellulitis Cellulitis is an infection of the skin and the tissue beneath it. The area is typically red and tender. It is caused by germs (bacteria) (usually staph or strep) that enter the body through cuts or sores. Cellulitis most commonly occurs in the arms or lower legs.   HOME CARE INSTRUCTIONS    If you are given a prescription for medications which kill germs (antibiotics), take as directed until finished.   If the infection is on the arm or leg, keep the limb elevated as able.   Use a warm cloth several times per day to relieve pain and encourage healing.   See your caregiver for recheck of the infected site as directed if problems arise.   Only take over-the-counter or prescription medicines for pain, discomfort, or fever as directed by your caregiver.  SEEK MEDICAL CARE IF:    The area of redness (inflammation) is spreading, there are red streaks coming from the infected site, or if a part of the infection begins to turn dark in color.   The joint or bone underneath the infected skin becomes painful after the skin has healed.   The infection returns in the same or another area after it seems to have gone away.   A boil or bump swells up. This may be an abscess.   New, unexplained problems such as pain or fever develop.  SEEK IMMEDIATE MEDICAL CARE IF:    You have a fever.   You or your child feels drowsy or lethargic.   There is vomiting, diarrhea, or lasting discomfort or feeling ill (malaise) with muscle aches and pains.  MAKE SURE YOU:    Understand these instructions.   Will watch your condition.   Will get help right away if you are not doing well or get worse.  Document Released: 07/22/2005 Document Revised: 10/01/2011 Document Reviewed: 05/30/2008 [facility] Patient Information 2012 [place], [place].Cellulitis Cellulitis is an infection of the skin and the tissue beneath it.  The area is typically red and tender. It is caused by germs (bacteria) (usually staph or strep) that enter the body through cuts or sores. Cellulitis most commonly occurs in the arms or lower legs.   HOME CARE INSTRUCTIONS    If you are given a prescription for medications which kill germs (antibiotics), take as directed until finished.   If the infection is on the arm or leg, keep the limb elevated as able.   Use a warm cloth several times per day to relieve pain and encourage healing.   See your caregiver for recheck of the infected site as directed if problems arise.   Only take over-the-counter or prescription medicines for pain, discomfort, or fever as directed by your caregiver.  SEEK MEDICAL CARE IF:    The area of redness (inflammation) is spreading, there are red streaks coming from the infected site, or if a part of the infection begins to turn dark in color.   The joint or bone underneath the infected skin becomes painful after the skin has healed.   The infection returns in the same or another area after it seems to have gone away.   A boil or bump swells up. This may be an abscess.   New, unexplained problems such as pain or fever develop.  SEEK IMMEDIATE MEDICAL CARE IF:    You have a fever.   You or your child feels drowsy or lethargic.   There  is vomiting, diarrhea, or lasting discomfort or feeling ill (malaise) with muscle aches and pains.  MAKE SURE YOU:    Understand these instructions.   Will watch your condition.   Will get help right away if you are not doing well or get worse.  Document Released: 07/22/2005 Document Revised: 10/01/2011 Document Reviewed: 05/30/2008 [facility] Patient Information 2012 [place], [place].

## 2012-06-30 NOTE — Progress Notes (Signed)
  Subjective:    Patient ID: Ryan Spencer, male    DOB: August 25, 1986, 26 y.o.   MRN: [id number]  HPI  26 year old patient who has a history of prior skin and soft tissue infections this has included a recent abscess involving his thigh area. He does have a prior history of MSRA. The past several weeks he has had some dry flaky skin over the dorsal aspect of his left foot. For the past several days he has had the onset of increasing pain swelling and drainage.  Past Medical History  Diagnosis Date  . MRSA (methicillin resistant staph aureus) culture positive   . Abscess     History   Social History  . Marital Status: Married    Spouse Name: N/A    Number of Children: N/A  . Years of Education: N/A   Occupational History  . Not on file.   Social History Main Topics  . Smoking status: Current Everyday Smoker    Types: Cigarettes  . Smokeless tobacco: Not on file  . Alcohol Use: No  . Drug Use: No  . Sexually Active: Not on file   Other Topics Concern  . Not on file   Social History Narrative   ** Merged History Encounter **     No past surgical history on file.  Family History  Problem Relation Age of Onset  . Arthritis Mother     No Known Allergies  Current Outpatient Prescriptions on File Prior to Visit  Medication Sig Dispense Refill  . cephALEXin (KEFLEX) 500 MG capsule Take 1 capsule (500 mg total) by mouth 4 (four) times daily.  40 capsule  0  . traMADol (ULTRAM) 50 MG tablet Take 1 tablet (50 mg total) by mouth every 6 (six) hours as needed for pain.  50 tablet  0  . mupirocin nasal ointment (BACTROBAN) 2 % Apply in each nostril daily for 30 days  1 g  0    Temp 98.6 F (37 C) (Oral)  Wt 150 lb (68.04 kg)       Review of Systems  Skin: Positive for rash.       Objective:   Physical Exam  Skin:       The patient has some dry scaly skin involving the dorsal aspect of his left foot just medial to this area, was a region of erythema slight warmth  and some serosanguineous drainage. There is a wider area of the mild erythema and excessive warmth          Assessment & Plan:   Cellulitis left foot. The patient retreated with Avelox 400 mg daily for 10 days samples were provided. Wound care discussed. Will call if unimproved

## 2012-06-30 NOTE — Telephone Encounter (Signed)
Caller: [name]/Patient; Patient Name: Ryan Spencer; PCP: Eleonore Chiqu[facility]); Best Callback Phone Number: [phone]; Call regarding Headache after taking Tramadol on 9-3 for Foot Abscess pain.  Patient denies Headache at time of call.  Patient's foot pain not relieved by Acetaminophen or Ibuprofen.  Foot is warm to touch, swelling, pain and redness, symptoms same since starting antibiotics on 9-3. Patient is doing warm compressess to help drain abscess, no drainage yet.  All emergent symptoms ruled out per Foot non-injury Protocol, see in 4 hours due to symptoms of infection that has not improved with 24 hours of home care/antibiotics.  No appointments available with Dr Amador Cunas, Please allow Patient time to schedule off from work today, 9-5, before coming in to appointment.

## 2012-08-02 ENCOUNTER — Ambulatory Visit (INDEPENDENT_AMBULATORY_CARE_PROVIDER_SITE_OTHER): Payer: BC Managed Care – PPO | Admitting: Internal Medicine

## 2012-08-02 ENCOUNTER — Encounter: Payer: Self-pay | Admitting: Internal Medicine

## 2012-08-02 VITALS — BP 130/76 | Temp 98.2°F | Wt 152.0 lb

## 2012-08-02 DIAGNOSIS — M25562 Pain in left knee: Secondary | ICD-10-CM

## 2012-08-02 DIAGNOSIS — F17200 Nicotine dependence, unspecified, uncomplicated: Secondary | ICD-10-CM

## 2012-08-02 DIAGNOSIS — M25569 Pain in unspecified knee: Secondary | ICD-10-CM

## 2012-08-02 NOTE — Progress Notes (Signed)
  Subjective:    Patient ID: Ryan Spencer, male    DOB: 1986/08/25, 26 y.o.   MRN: [id number]  HPI  26 year old patient who presents today with a chief complaint of left knee pain. Approximately 2 months ago he was doing weight training and performing squats. He fell traumatizing his left knee and had immediate pain that lasted for approximately one week. He may have had some slight swelling associated with the injury. Pain resolved but reoccurred 2-3 weeks ago and now has become progressively worse. Initially pain was present only towards the end of the day but now pain is more severe and occurs the early in his workday. He has been using Aleve without much benefit.  Past Medical History  Diagnosis Date  . MRSA (methicillin resistant staph aureus) culture positive   . Abscess     History   Social History  . Marital Status: Married    Spouse Name: N/A    Number of Children: N/A  . Years of Education: N/A   Occupational History  . Not on file.   Social History Main Topics  . Smoking status: Current Every Day Smoker    Types: Cigarettes  . Smokeless tobacco: Never Used  . Alcohol Use: No  . Drug Use: No  . Sexually Active: Not on file   Other Topics Concern  . Not on file   Social History Narrative   ** Merged History Encounter **     No past surgical history on file.  Family History  Problem Relation Age of Onset  . Arthritis Mother     No Known Allergies  Current Outpatient Prescriptions on File Prior to Visit  Medication Sig Dispense Refill  . mupirocin nasal ointment (BACTROBAN) 2 % Apply in each nostril daily for 30 days  1 g  0    BP 130/76  Temp 98.2 F (36.8 C) (Oral)  Wt 152 lb (68.947 kg)      Review of Systems  Musculoskeletal: Positive for joint swelling, arthralgias and gait problem.       Objective:   Physical Exam  Constitutional: He appears well-developed and well-nourished. No distress.  Musculoskeletal:       Examinational left  knee revealed no obvious effusion. There are  no inflammatory changes There is mild tenderness along the medial joint line          Assessment & Plan:   Posttraumatic left knee pain. Possible left medial meniscal tear. We'll continue Aleve. Attempt to moderate his activities. Apply ice after overuse. We'll set up for orthopedic referral

## 2012-08-02 NOTE — Patient Instructions (Signed)
Orthopedic referral as discussed Continue Aleve 2 tablets twice daily Apply ice after overuse  Call or return to clinic prn if these symptoms worsen or fail to improve as anticipated.

## 2012-08-13 ENCOUNTER — Other Ambulatory Visit: Payer: Self-pay | Admitting: Sports Medicine

## 2012-08-13 ENCOUNTER — Ambulatory Visit (HOSPITAL_COMMUNITY)
Admission: RE | Admit: 2012-08-13 | Discharge: 2012-08-13 | Disposition: A | Discharge status: Home / Self Care | Payer: BC Managed Care – PPO | Source: Ambulatory Visit | Attending: Sports Medicine | Admitting: Sports Medicine

## 2012-08-13 DIAGNOSIS — Z1389 Encounter for screening for other disorder: Secondary | ICD-10-CM | POA: Insufficient documentation

## 2012-08-13 DIAGNOSIS — S0590XA Unspecified injury of unspecified eye and orbit, initial encounter: Secondary | ICD-10-CM

## 2012-08-13 IMAGING — CR DG ORBITS FOR FOREIGN BODY
2 series · 2 of 2 positions shown · non-contrast
Comparison: None

CLINICAL DATA: Pre MRI screening

ORBITS FOR FOREIGN BODY - 2 VIEW

[w waters (1 of 2)]
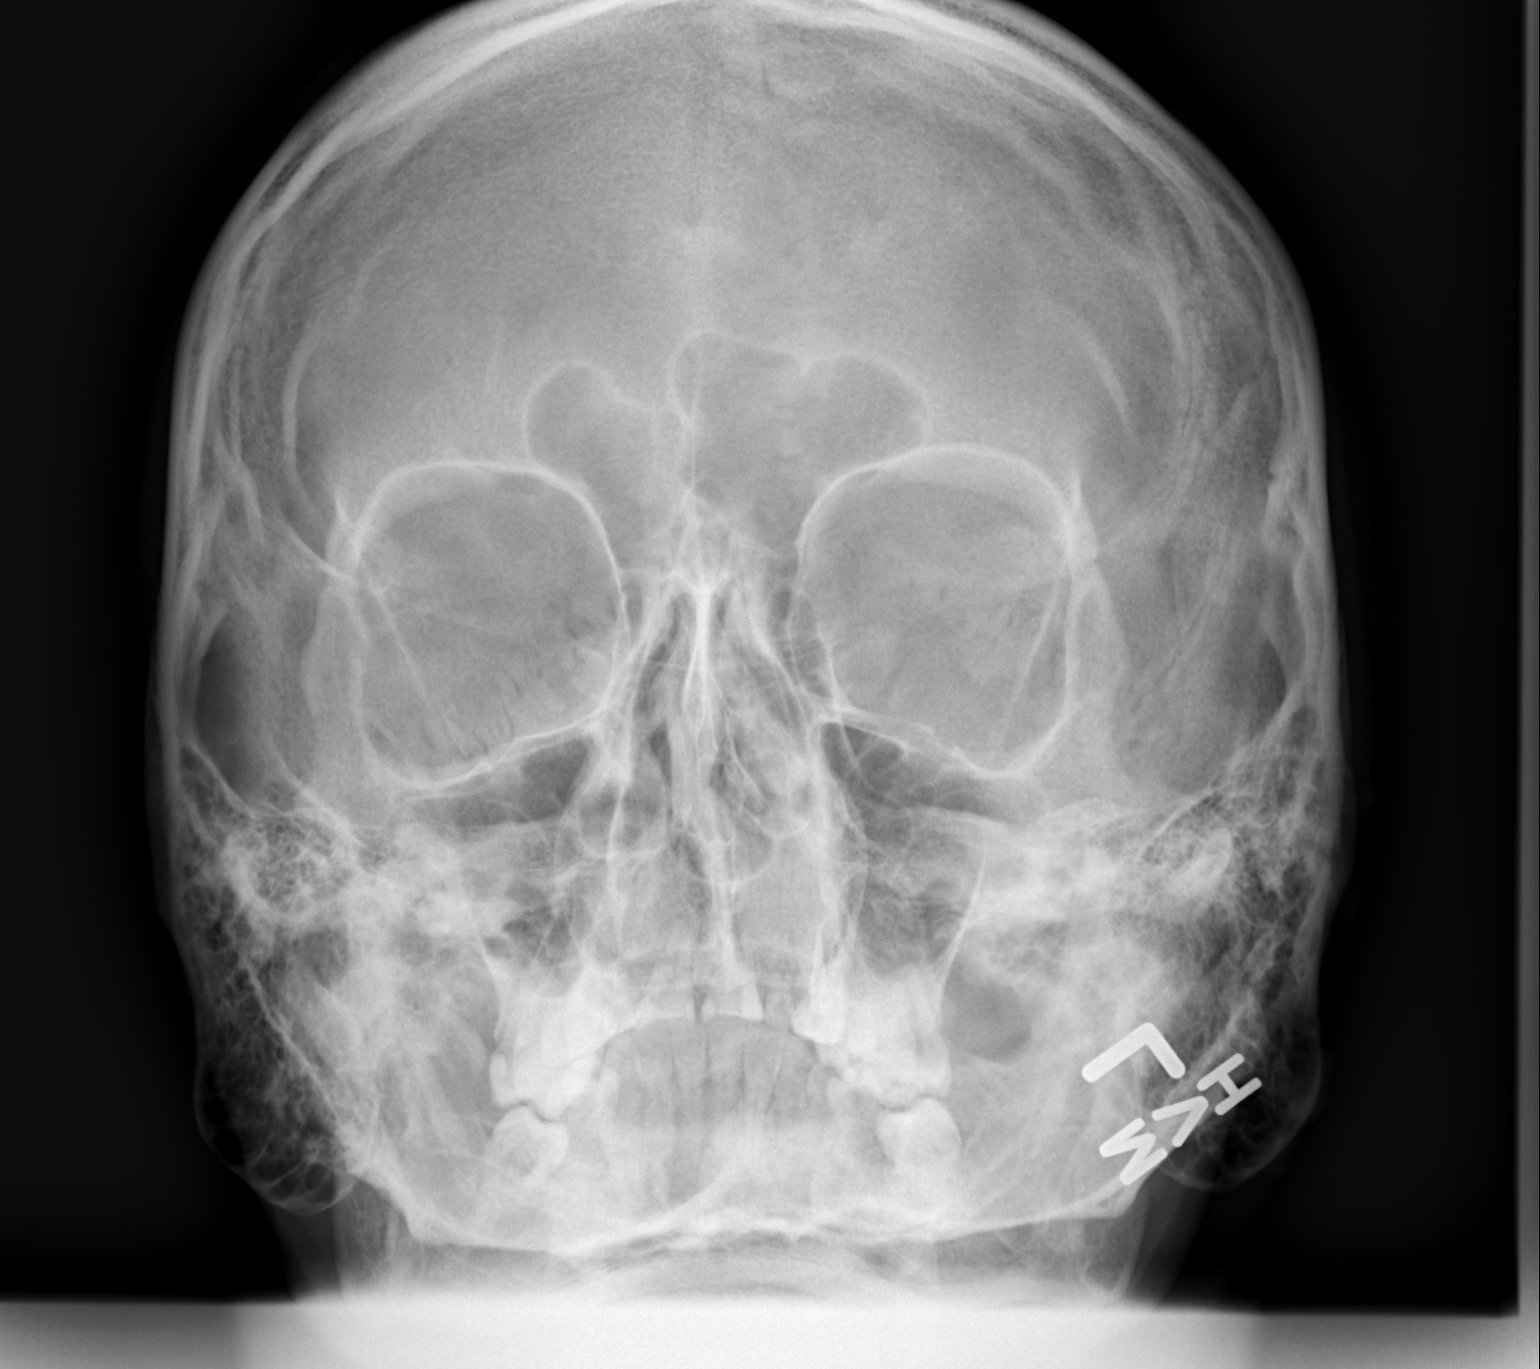

[w waters (2 of 2)]
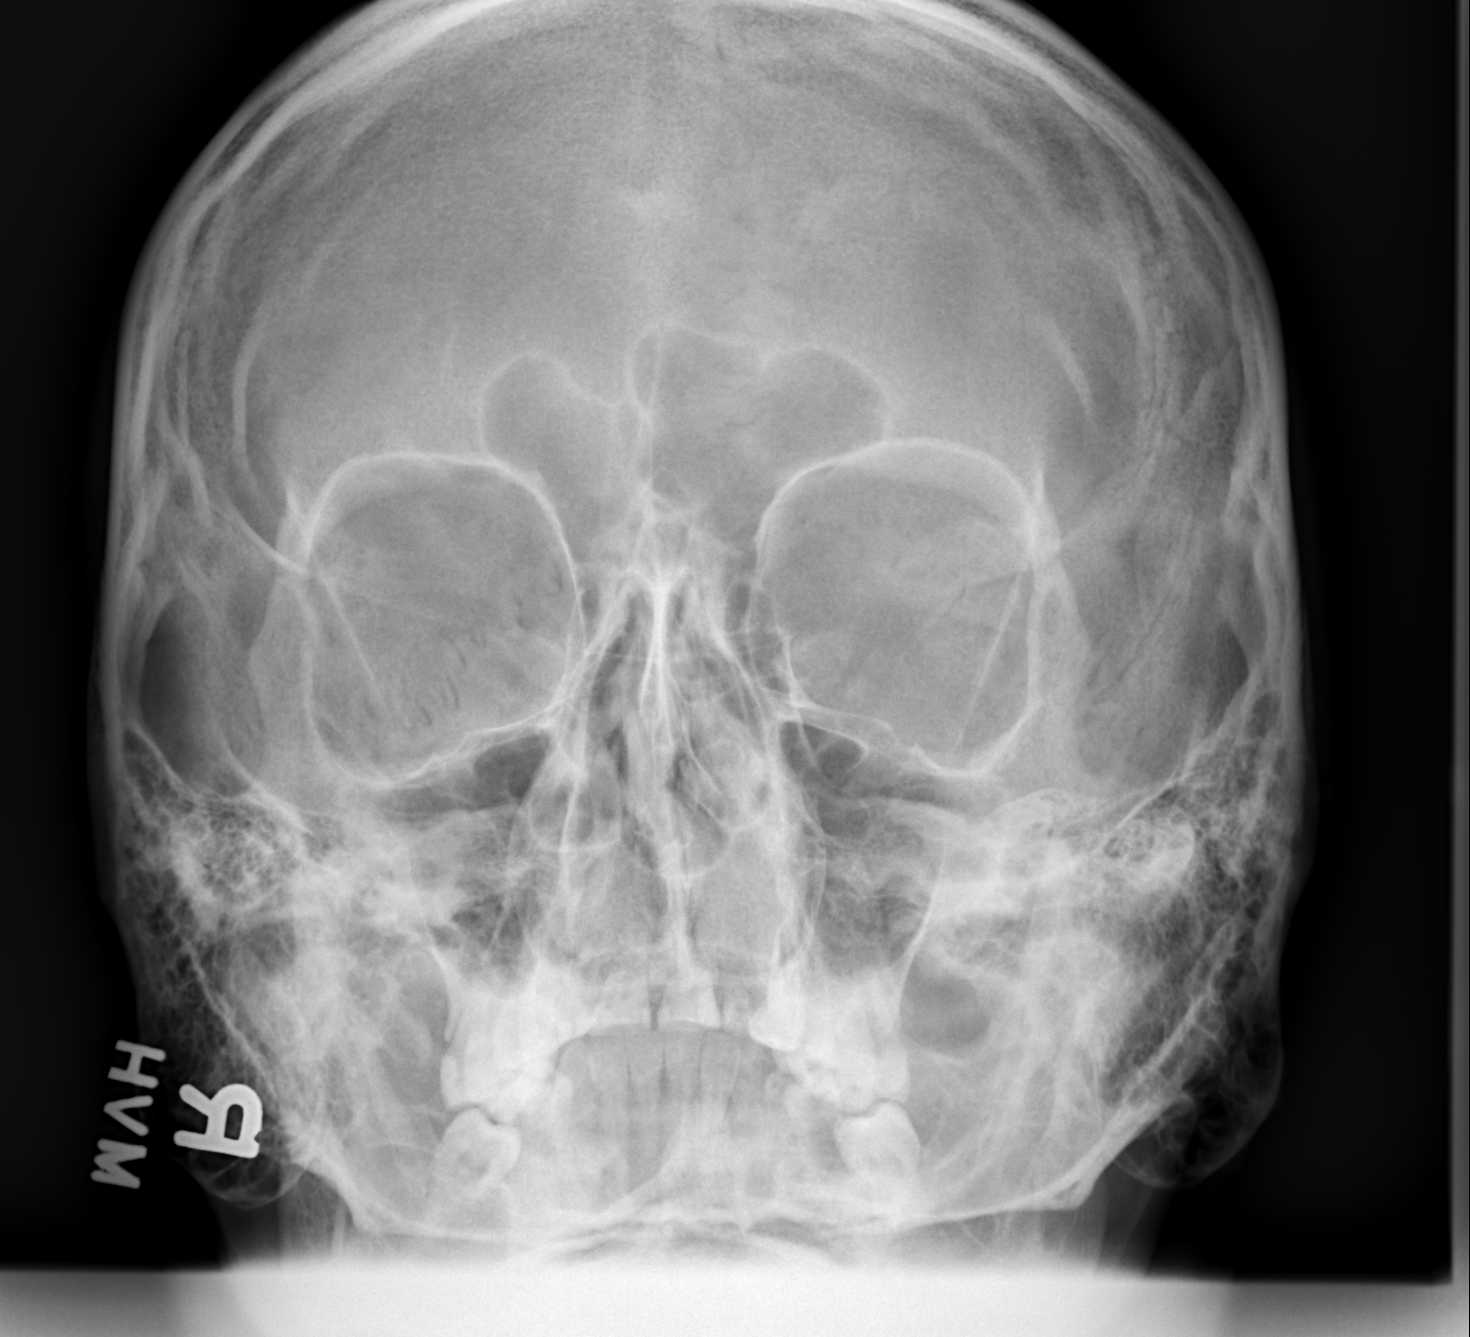

[2 of 2 positions shown; findings below may reference images not displayed]

FINDINGS: Orbits are clear.  No radiopaque foreign bodies are
identified within the orbits.
IMPRESSION: 1.  Negative exam.

## 2012-11-18 ENCOUNTER — Other Ambulatory Visit: Payer: Self-pay | Admitting: *Deleted

## 2012-11-18 MED ORDER — TRAMADOL HCL 50 MG PO TABS: ORAL_TABLET | ORAL | Status: DC

## 2013-04-03 ENCOUNTER — Encounter: Payer: Self-pay | Admitting: Internal Medicine

## 2013-04-03 ENCOUNTER — Ambulatory Visit (INDEPENDENT_AMBULATORY_CARE_PROVIDER_SITE_OTHER): Payer: Managed Care, Other (non HMO) | Admitting: Internal Medicine

## 2013-04-03 VITALS — BP 124/76 | HR 65 | Temp 98.7°F | Resp 18 | Wt 151.0 lb

## 2013-04-03 DIAGNOSIS — M549 Dorsalgia, unspecified: Secondary | ICD-10-CM

## 2013-04-03 MED ORDER — HYDROCODONE-ACETAMINOPHEN 5-325 MG PO TABS: 1.0000 | ORAL_TABLET | Freq: Four times a day (QID) | ORAL | Status: AC | PRN

## 2013-04-03 NOTE — Patient Instructions (Signed)
Most patients with low back pain will improve with time over the next two to 6 weeks.  Keep active but avoid any activities that cause pain.  Apply moist heat to the low back area several times daily. 
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
## 2013-04-12 ENCOUNTER — Emergency Department (HOSPITAL_COMMUNITY)
Admission: EM | Admit: 2013-04-12 | Discharge: 2013-04-12 | Disposition: A | Discharge status: Home / Self Care | Payer: Managed Care, Other (non HMO) | Source: Home / Self Care | Attending: Emergency Medicine | Admitting: Emergency Medicine

## 2013-04-12 ENCOUNTER — Encounter (HOSPITAL_COMMUNITY): Payer: Self-pay | Admitting: Emergency Medicine

## 2013-04-12 DIAGNOSIS — L0390 Cellulitis, unspecified: Secondary | ICD-10-CM

## 2013-04-12 MED ORDER — CLINDAMYCIN HCL 300 MG PO CAPS: 300.0000 mg | ORAL_CAPSULE | Freq: Four times a day (QID) | ORAL | Status: DC

## 2013-04-12 MED ORDER — CEFTRIAXONE SODIUM 1 G IJ SOLR
1.0000 g | Freq: Once | INTRAMUSCULAR | Status: AC
  Administered 2013-04-12: 1 g via INTRAMUSCULAR

## 2013-04-12 MED ORDER — LIDOCAINE HCL (PF) 1 % IJ SOLN
INTRAMUSCULAR | Status: AC
  Filled 2013-04-12: qty 5

## 2013-04-12 MED ORDER — OXYCODONE-ACETAMINOPHEN 5-325 MG PO TABS: ORAL_TABLET | ORAL | Status: DC

## 2013-04-12 MED ORDER — CEFTRIAXONE SODIUM 1 G IJ SOLR
INTRAMUSCULAR | Status: AC
  Filled 2013-04-12: qty 10

## 2013-04-12 NOTE — ED Notes (Addendum)
C/o ? Fire ant bite to L index finger on Sunday while mowing the grass.  It was itching but now its hurting and swelling since Monday afternoon.  Pt. states Dr. Jeannetta Nap gave him Minocycline on Monday but states it is getting worse.

## 2013-04-12 NOTE — ED Provider Notes (Signed)
Chief Complaint:   Chief Complaint  Patient presents with  . Insect Bite    History of Present Illness:   Ryan Spencer is a 27 year old male who thinks he was bitten on the index finger of his left hand this past Sunday, 4 days ago, although he never actually did see anything biting him. He's had pain, swelling, and erythema over the dorsum of the proximal phalanx of the index finger the left hand ever since then. It hurts to extend and flex the finger. There is a small pustule which has drained a little bit of serous fluid. The swelling and erythema does not extend proximally or distally, but it does extend around to the volar surface somewhat. He denies any numbness or tingling of the tip of the finger. He has no history of gout. He went to another urgent care and saw a physician this past Monday, 3 days ago was given minocycline but is no better. He has a history of MRSA.  Review of Systems:  Other than noted above, the patient denies any of the following symptoms: Systemic:  No fevers, chills, sweats, or aches.  No fatigue or tiredness. Musculoskeletal:  No joint pain, arthritis, bursitis, swelling, back pain, or neck pain. Neurological:  No muscular weakness, paresthesias, headache, or trouble with speech or coordination.  No dizziness.  PMFSH:  Past medical history, family history, social history, meds, and allergies were reviewed.    Physical Exam:   Vital signs:  BP 120/66  Pulse 73  Temp(Src) 98.9 F (37.2 C) (Oral)  Resp 16  SpO2 100% Gen:  Alert and oriented times 3.  In no distress. Musculoskeletal: There is erythema, swelling, and tenderness to palpation over the proximal phalanx of the left index finger dorsally. It does not extend proximally or distally. There is a small ulceration at the center of this which was not draining any blood or pus.  Otherwise, all joints had a full a ROM with no swelling, bruising or deformity.  No edema, pulses full. Extremities were warm and  pink.  Capillary refill was brisk.  Skin:  Clear, warm and dry.  No rash. Neuro:  Alert and oriented times 3.  Muscle strength was normal.  Sensation was intact to light touch.   Course in Urgent Care Center:   A culture was obtained of the ulcerated area. He was given Rocephin 1 g IM. He was put in a finger splint in a position of comfort.  Assessment:  The encounter diagnosis was Cellulitis.  I called the hand surgeon on-call, Dr. Bradly Bienenstock. He suggested that the finger be splinted and that he followup tomorrow in his office. We'll go ahead and give Rocephin, have him stop the minocycline and and switched to clindamycin instead.  Plan:   1.  The following meds were prescribed:   New Prescriptions   CLINDAMYCIN (CLEOCIN) 300 MG CAPSULE    Take 1 capsule (300 mg total) by mouth 4 (four) times daily.   OXYCODONE-ACETAMINOPHEN (PERCOCET) 5-325 MG PER TABLET    1 to 2 tablets every 6 hours as needed for pain.   2.  The patient was instructed in symptomatic care, including rest and activity, elevation, application of ice and compression.  Appropriate handouts were given. 3.  The patient was told to return if becoming worse in any way, if no better in 3 or 4 days, and given some red flag symptoms such as fever or swelling extending it proximally or distally that would indicate earlier  return.   4.  The patient was told to follow up with Dr. Bradly Bienenstock tomorrow.    Reuben Likes, MD 04/12/13 [phone]

## 2013-04-16 ENCOUNTER — Telehealth (HOSPITAL_COMMUNITY): Payer: Self-pay | Admitting: *Deleted

## 2013-04-16 LAB — CULTURE, ROUTINE-ABSCESS

## 2013-04-16 NOTE — ED Notes (Signed)
Abscess culture: L finger: rare MRSA.  Pt. adequately treated with Cleocin.  I called pt. and left a message to call. Pt. called right back.  Pt. verified x 2 and given results.  Pt. told he was adequately treated with Cleocin. Pt. states he has had MRSA before. I reviewed the [facility] MRSA instructions with him.  He voiced understanding. Vassie Moselle 04/16/2013

## 2013-04-18 ENCOUNTER — Other Ambulatory Visit: Payer: Self-pay | Admitting: Internal Medicine

## 2013-04-20 NOTE — Telephone Encounter (Signed)
Rx sent to pharmacy   

## 2013-04-20 NOTE — Telephone Encounter (Signed)
30

## 2013-05-05 ENCOUNTER — Other Ambulatory Visit: Payer: Self-pay | Admitting: Internal Medicine

## 2013-05-10 NOTE — Telephone Encounter (Signed)
Request from pharmacy refill on Hydrocodone. Please advise

## 2013-05-10 NOTE — Telephone Encounter (Signed)
ok 

## 2013-05-22 ENCOUNTER — Other Ambulatory Visit: Payer: Self-pay | Admitting: Internal Medicine

## 2013-05-26 ENCOUNTER — Ambulatory Visit (INDEPENDENT_AMBULATORY_CARE_PROVIDER_SITE_OTHER): Payer: Managed Care, Other (non HMO) | Admitting: Family Medicine

## 2013-05-26 VITALS — BP 120/80 | HR 86 | Temp 98.0°F | Resp 16 | Ht 71.0 in | Wt 149.0 lb

## 2013-05-26 DIAGNOSIS — L0291 Cutaneous abscess, unspecified: Secondary | ICD-10-CM

## 2013-05-26 DIAGNOSIS — IMO0002 Reserved for concepts with insufficient information to code with codable children: Secondary | ICD-10-CM

## 2013-05-26 DIAGNOSIS — B9562 Methicillin resistant Staphylococcus aureus infection as the cause of diseases classified elsewhere: Secondary | ICD-10-CM

## 2013-05-26 DIAGNOSIS — A4902 Methicillin resistant Staphylococcus aureus infection, unspecified site: Secondary | ICD-10-CM

## 2013-05-26 DIAGNOSIS — L03119 Cellulitis of unspecified part of limb: Secondary | ICD-10-CM

## 2013-05-26 LAB — POCT CBC
HCT, POC: 44.3 % (ref 43.5–53.7)
Hemoglobin: 14.4 g/dL (ref 14.1–18.1)
Lymph, poc: 3.3 (ref 0.6–3.4)
MCH, POC: 31.4 pg — AB (ref 27–31.2)
MCHC: 32.5 g/dL (ref 31.8–35.4)
POC Granulocyte: 9.6 — AB (ref 2–6.9)
POC LYMPH PERCENT: 23.8 %L (ref 10–50)
RDW, POC: 14.2 %
WBC: 13.9 10*3/uL — AB (ref 4.6–10.2)

## 2013-05-26 MED ORDER — SULFAMETHOXAZOLE-TMP DS 800-160 MG PO TABS: 1.0000 | ORAL_TABLET | Freq: Two times a day (BID) | ORAL | Status: DC

## 2013-05-26 NOTE — Progress Notes (Signed)
[facility] [address] [place] [address] [phone]  Date:  05/26/2013   Name:  Ryan Spencer   DOB:  1985-12-28   MRN:  [id number]  PCP:  Rogelia Boga, MD    Chief Complaint: Joint Swelling   History of Present Illness:  [name] is a 27 y.o. very pleasant male patient who presents with the following:  History of MRSA.  He noted a "bug bite" on his left elbow early this week.  Over the last 48 hours or so it has become swollen and hurt more.  He has noted pain down into the hand at times He noted some chills today, no fever except the elbow feels hot.   No injury that he is aware of.  He last had MRSA and took abx for same in June of this year.   There is another tender boil on the right side of his trunk- it has drained pus today  He is taking some leftover keflex- 500 4x a day for the last 2 days.    He has an rx for 30 vicodin from Dr. Amador Cunas from 05/24/13.  He uses this for back pain.  He is otherwise generally healthy Here today with his wife and son  Patient Active Problem List   Diagnosis Date Noted  . CARPAL TUNNEL SYNDROME, LEFT 05/07/2009  . RHUS DERMATITIS 04/11/2009  . TOBACCO ABUSE 11/19/2008  . URI 09/24/2008  . ARTHRALGIA 08/07/2008    Past Medical History  Diagnosis Date  . MRSA (methicillin resistant staph aureus) culture positive   . Abscess     History reviewed. No pertinent past surgical history.  History  Substance Use Topics  . Smoking status: Current Every Day Smoker -- 0.90 packs/day    Types: Cigarettes  . Smokeless tobacco: Never Used  . Alcohol Use: Yes     Comment: occasional    Family History  Problem Relation Age of Onset  . Arthritis Mother     Allergies  Allergen Reactions  . Tramadol Other (See Comments)    Headache    Medication list has been reviewed and updated.  Current Outpatient Prescriptions on File Prior to Visit  Medication Sig Dispense Refill  .  HYDROcodone-acetaminophen (NORCO/VICODIN) 5-325 MG per tablet TAKE 1 TABLET BY MOUTH EVERY 6 HOURS AS NEEDED FOR PAIN  30 tablet  0  . clindamycin (CLEOCIN) 300 MG capsule Take 1 capsule (300 mg total) by mouth 4 (four) times daily.  40 capsule  0  . meloxicam (MOBIC) 15 MG tablet Take 15 mg by mouth daily.      . methocarbamol (ROBAXIN) 750 MG tablet Take 750 mg by mouth 3 (three) times daily as needed.      . minocycline (MINOCIN,DYNACIN) 100 MG capsule Take 100 mg by mouth 2 (two) times daily.      Marland Kitchen oxyCODONE-acetaminophen (PERCOCET) 5-325 MG per tablet 1 to 2 tablets every 6 hours as needed for pain.  20 tablet  0   No current facility-administered medications on file prior to visit.    Review of Systems:  As per HPI- otherwise negative.   Physical Examination: Filed Vitals:   05/26/13 1712  BP: 120/80  Pulse: 86  Temp: 98 F (36.7 C)  Resp: 16   Filed Vitals:   05/26/13 1712  Height: 5\' 11"  (1.803 m)  Weight: 149 lb (67.586 kg)   Body mass index is 20.79 kg/(m^2). Ideal Body Weight: Weight in (lb) to  have BMI = 25: 178.9  GEN: WDWN, NAD, Non-toxic, A & O x 3, looks well HEENT: Atraumatic, Normocephalic. Neck supple. No masses, No LAD. Ears and Nose: No external deformity. CV: RRR, No M/G/R. No JVD. No thrill. No extra heart sounds. PULM: CTA B, no wheezes, crackles, rhonchi. No retractions. No resp. distress. No accessory muscle use. There is a small indurated area on the right trunk.  It is quite tender but not fluctuant  EXTR: No c/c/e NEURO Normal gait.  PSYCH: Normally interactive. Conversant. Not depressed or anxious appearing.  Calm demeanor.  Left elbow: he has a 5.5 by 6 inch indurated area over the posterior elbow, with a small wound lateral to the joint.   He is able to fully extend the elbow, but has pain with flexion.  He is able to pronate and supinate. Normal grip strength, cap refill and sensation of the hand   Results for orders placed in visit on  05/26/13  POCT CBC      Result Value Range   WBC 13.9 (*) 4.6 - 10.2 K/uL   Lymph, poc 3.3  0.6 - 3.4   POC LYMPH PERCENT 23.8  10 - 50 %L   MID (cbc) 1.0 (*) 0 - 0.9   POC MID % 6.9  0 - 12 %M   POC Granulocyte 9.6 (*) 2 - 6.9   Granulocyte percent 69.3  37 - 80 %G   RBC 4.59 (*) 4.69 - 6.13 M/uL   Hemoglobin 14.4  14.1 - 18.1 g/dL   HCT, POC 09.8  11.9 - 53.7 %   MCV 96.5  80 - 97 fL   MCH, POC 31.4 (*) 27 - 31.2 pg   MCHC 32.5  31.8 - 35.4 g/dL   RDW, POC 14.7     Platelet Count, POC 217  142 - 424 K/uL   MPV 8.9  0 - 99.8 fL    Assessment and Plan: Cellulitis of elbow - Plan: POCT CBC, sulfamethoxazole-trimethoprim (BACTRIM DS) 800-160 MG per tablet  [name] is here with presumed MRSA cellulitis over the left elbow, as well as a small area on his right trunk.  Discussed options for treatment including admission for IV abx vs trial of oral abx with close follow-up.  He prefers to try oral therapy.  Will stop keflex- explained this will generally not treat MRSA.  Start septra DS BID- start tonight.  He will come in tomorrow for a recheck. If any severe pain or other changes tonight he is to go to the ER.      Signed Abbe Amsterdam, MD

## 2013-05-26 NOTE — Patient Instructions (Addendum)
Start on the septra antibiotic tonight.  Please come and see [place] tomorrow for a recheck- ask to see Dr. Cleta Alberts.  Elevate your elbow as best as you can.    Please fast pass to see Dr. Cleta Alberts on 05/27/13

## 2013-05-27 ENCOUNTER — Ambulatory Visit (INDEPENDENT_AMBULATORY_CARE_PROVIDER_SITE_OTHER): Payer: Managed Care, Other (non HMO) | Admitting: Emergency Medicine

## 2013-05-27 ENCOUNTER — Encounter (HOSPITAL_COMMUNITY): Payer: Self-pay | Admitting: Emergency Medicine

## 2013-05-27 ENCOUNTER — Emergency Department (HOSPITAL_COMMUNITY)
Admission: EM | Admit: 2013-05-27 | Discharge: 2013-05-27 | Disposition: A | Discharge status: Home / Self Care | Payer: Managed Care, Other (non HMO) | Attending: Emergency Medicine | Admitting: Emergency Medicine

## 2013-05-27 VITALS — BP 108/64 | HR 80 | Temp 98.3°F | Resp 16 | Ht 70.0 in | Wt 147.6 lb

## 2013-05-27 DIAGNOSIS — F17200 Nicotine dependence, unspecified, uncomplicated: Secondary | ICD-10-CM | POA: Insufficient documentation

## 2013-05-27 DIAGNOSIS — L0291 Cutaneous abscess, unspecified: Secondary | ICD-10-CM | POA: Insufficient documentation

## 2013-05-27 DIAGNOSIS — M25529 Pain in unspecified elbow: Secondary | ICD-10-CM

## 2013-05-27 DIAGNOSIS — L0390 Cellulitis, unspecified: Secondary | ICD-10-CM | POA: Insufficient documentation

## 2013-05-27 DIAGNOSIS — Z79899 Other long term (current) drug therapy: Secondary | ICD-10-CM | POA: Insufficient documentation

## 2013-05-27 DIAGNOSIS — M25522 Pain in left elbow: Secondary | ICD-10-CM

## 2013-05-27 DIAGNOSIS — Z22322 Carrier or suspected carrier of Methicillin resistant Staphylococcus aureus: Secondary | ICD-10-CM | POA: Insufficient documentation

## 2013-05-27 LAB — CBC WITH DIFFERENTIAL/PLATELET
Basophils Relative: 0 % (ref 0–1)
HCT: 37.9 % — ABNORMAL LOW (ref 39.0–52.0)
Hemoglobin: 13.1 g/dL (ref 13.0–17.0)
Lymphocytes Relative: 30 % (ref 12–46)
Lymphs Abs: 3 10*3/uL (ref 0.7–4.0)
MCHC: 34.6 g/dL (ref 30.0–36.0)
Monocytes Absolute: 0.8 10*3/uL (ref 0.1–1.0)
Monocytes Relative: 8 % (ref 3–12)
Neutro Abs: 5.9 10*3/uL (ref 1.7–7.7)
Neutrophils Relative %: 59 % (ref 43–77)
RBC: 4.19 MIL/uL — ABNORMAL LOW (ref 4.22–5.81)
WBC: 10 10*3/uL (ref 4.0–10.5)

## 2013-05-27 LAB — BASIC METABOLIC PANEL
BUN: 10 mg/dL (ref 6–23)
CO2: 30 mEq/L (ref 19–32)
Chloride: 100 mEq/L (ref 96–112)
Creatinine, Ser: 1.21 mg/dL (ref 0.50–1.35)
GFR calc Af Amer: >90 mL/min (ref >90)
Glucose, Bld: 92 mg/dL (ref 70–99)
Potassium: 3.6 mEq/L (ref 3.5–5.1)

## 2013-05-27 MED ORDER — HYDROCODONE-ACETAMINOPHEN 5-325 MG PO TABS
1.0000 | ORAL_TABLET | ORAL | Status: AC
  Administered 2013-05-27: 1 via ORAL
  Filled 2013-05-27: qty 1

## 2013-05-27 MED ORDER — MORPHINE SULFATE 4 MG/ML IJ SOLN
4.0000 mg | Freq: Once | INTRAMUSCULAR | Status: AC
  Administered 2013-05-27: 4 mg via INTRAVENOUS
  Filled 2013-05-27: qty 1

## 2013-05-27 MED ORDER — VANCOMYCIN HCL 10 G IV SOLR
1250.0000 mg | Freq: Two times a day (BID) | INTRAVENOUS | Status: DC
  Administered 2013-05-27: 1250 mg via INTRAVENOUS
  Filled 2013-05-27: qty 1250

## 2013-05-27 MED ORDER — HYDROCODONE-ACETAMINOPHEN 5-325 MG PO TABS: 1.0000 | ORAL_TABLET | Freq: Four times a day (QID) | ORAL | Status: DC | PRN

## 2013-05-27 NOTE — ED Provider Notes (Addendum)
CSN: [id number]     Arrival date & time 05/27/13  1411 History     First MD Initiated Contact with Patient 05/27/13 1458     Chief Complaint  Patient presents with  . Wound Infection    HPI Started this past week.  First noticed a small red bump on left elbow.  Wednesday the whole area started to swell.  Pt went to an uc yesterday and was started on bactrim.  The area is painful and increases to touch.  He has felt chilled but no known fevers.  Went back to the uc today who sent him to the ED. Pt has had this problem in the past.  He has to have I&D procedures for this. Past Medical History  Diagnosis Date  . MRSA (methicillin resistant staph aureus) culture positive   . Abscess    History reviewed. No pertinent past surgical history. Family History  Problem Relation Age of Onset  . Arthritis Mother    History  Substance Use Topics  . Smoking status: Current Every Day Smoker -- 0.90 packs/day    Types: Cigarettes  . Smokeless tobacco: Never Used  . Alcohol Use: Yes     Comment: occasional    Review of Systems  All other systems reviewed and are negative.    Allergies  Tramadol  Home Medications   Current Outpatient Rx  Name  Route  Sig  Dispense  Refill  . acetaminophen (TYLENOL) 500 MG tablet   Oral   Take 1,000 mg by mouth every 6 (six) hours as needed for pain.         Marland Kitchen HYDROcodone-acetaminophen (NORCO/VICODIN) 5-325 MG per tablet   Oral   Take 1 tablet by mouth every 6 (six) hours as needed for pain.         Marland Kitchen sulfamethoxazole-trimethoprim (BACTRIM DS) 800-160 MG per tablet   Oral   Take 1 tablet by mouth 2 (two) times daily.          BP 103/49  Temp(Src) 99 F (37.2 C) (Oral)  Resp 20  SpO2 98% Physical Exam  Nursing note and vitals reviewed. Constitutional: He appears well-developed and well-nourished. No distress.  HENT:  Head: Normocephalic and atraumatic.  Right Ear: External ear normal.  Left Ear: External ear normal.  Eyes:  Conjunctivae are normal. Right eye exhibits no discharge. Left eye exhibits no discharge. No scleral icterus.  Neck: Neck supple. No tracheal deviation present.  Cardiovascular: Normal rate, regular rhythm and intact distal pulses.   Pulmonary/Chest: Effort normal and breath sounds normal. No stridor. No respiratory distress. He has no wheezes. He has no rales.  Abdominal: Soft. Bowel sounds are normal. He exhibits no distension. There is no tenderness. There is no rebound and no guarding.  Musculoskeletal: He exhibits tenderness. He exhibits no edema.       Left elbow: He exhibits swelling. He exhibits no effusion and no laceration. Tenderness found.  Small scab without area of fluctuance left elbow region, surrounding erythema and ttp, erythema extends beyond marked area (marked yesterday), no pain with passive range of motion with flexion and extension of elbow  Neurological: He is alert. He has normal strength. No sensory deficit. Cranial nerve deficit:  no gross defecits noted. He exhibits normal muscle tone. He displays no seizure activity. Coordination normal.  Skin: Skin is warm and dry. No rash noted.  Psychiatric: He has a normal mood and affect.      ED Course   Procedures (including critical  care time)  Labs Reviewed  CBC WITH DIFFERENTIAL - Abnormal; Notable for the following:    RBC 4.19 (*)    HCT 37.9 (*)    All other components within normal limits  BASIC METABOLIC PANEL - Abnormal; Notable for the following:    GFR calc non Af Amer 80 (*)    All other components within normal limits   No results found. No diagnosis found.  MDM  The bursa does not appear to be significantly inflamed. He has good range of motion in the elbow. I am not suspicious for a septic arthritis. Patient does appear to have a cellulitis. He has been on one day of antibiotics.   He does have a small area of induration.  PA Kirichenko Will perform I&D Discussed the option of admission for  observation versus discharge and close outpatient followup. Patient states otherwise feels fine and can get back to the hospital easily. He would prefer to go home and return for reevaluation.  Celene Kras, MD 05/27/13 1610  Celene Kras, MD 05/27/13 [phone]

## 2013-05-27 NOTE — Progress Notes (Signed)
  Subjective:    Patient ID: Ryan Spencer, male    DOB: 04/26/1986, 27 y.o.   MRN: [id number]  HPI patient here to followup cellulitis of the left elbow presumed secondary to staph. He has had increasing pain increasing swelling increasing redness in the area. He did work today    Review of Systems     Objective:   Physical Exam There is a pustule present on the posterior portion of the elbow. There is some mild pain with flexion extension. There is an increase of redness of approximately 1 inch circumferentially around the previously marked area of redness. The area of lymphangitis extending up into the axilla is improved the       Assessment & Plan:   Patient is to continue his Bactrim. He will be sent to the emergency room for their evaluation and opinion regarding the possibility of receiving IV vancomycin. A wound culture was done

## 2013-05-27 NOTE — ED Notes (Addendum)
Infection in left elbow, patient states thinks from bug bite, went to urgent care at [facility] yesterday, put on Septra, went back today and was told to come to ED, pt has h/o MRSA x 2, last time was one month ago in finger

## 2013-05-27 NOTE — Progress Notes (Signed)
ANTIBIOTIC CONSULT NOTE - INITIAL  Pharmacy Consult for Vancomycin Indication: Cellulitis, hx of MRSA infection  Allergies  Allergen Reactions  . Tramadol Other (See Comments)    Headache   Patient Measurements:   TBW 67 kg  Vital Signs: Temp: 99 F (37.2 C) (08/02 1433) Temp src: Oral (08/02 1433) BP: 103/49 mmHg (08/02 1433) Pulse Rate: 80 (08/02 1245)   Labs:  Recent Labs  05/26/13 1743  WBC 13.9*  HGB 14.4   The CrCl is unknown because both a height and weight (above a minimum accepted value) are required for this calculation. No results found for this basename: VANCOTROUGH, VANCOPEAK, VANCORANDOM, GENTTROUGH, GENTPEAK, GENTRANDOM, TOBRATROUGH, TOBRAPEAK, TOBRARND, AMIKACINPEAK, AMIKACINTROU, AMIKACIN,  in the last 72 hours   Microbiology: No results found for this or any previous visit (from the past 720 hour(s)).  Medical History: Past Medical History  Diagnosis Date  . MRSA (methicillin resistant staph aureus) culture positive   . Abscess    Medications:  Anti-infectives   Start     Dose/Rate Route Frequency Ordered Stop   05/27/13 1600  vancomycin (VANCOCIN) 1,250 mg in sodium chloride 0.9 % 250 mL IVPB     1,250 mg 166.7 mL/hr over 90 Minutes Intravenous Every 12 hours 05/27/13 1522       Assessment: 26 yoM with L elbow cellulitis, not improved with Septra po. Hx of previous MRSA infection, treated with Clindamycin & Doxycycline. Vancomycin per Pharmacy.  No hx of renal dysfunction, clearance should be wnl.  WBC 13.9K, temp 99  Goal of Therapy:  Vancomycin trough level 10-15 mcg/ml  Plan:  Vancomycin 1250mg  IV q12 Monitor renal function  Otho Bellows PharmD Pager [phone] 05/27/2013, 3:49 PM

## 2013-05-27 NOTE — ED Provider Notes (Signed)
Pt is being seen by Dr. Lynelle Doctor. Left elbow abscess +cellulitis. Asked to I&D.   INCISION AND DRAINAGE Performed by: Jaynie Crumble A Consent: Verbal consent obtained. Risks and benefits: risks, benefits and alternatives were discussed Type: abscess  Body area: skin of left elbow  Anesthesia: local infiltration  Incision was made with a scalpel.  Local anesthetic: lidocaine 2% wo epinephrine  Anesthetic total: 2 ml  Complexity: complex Blunt dissection to break up loculations  Drainage: purulent  Drainage amount: moderate  Packing material: no packing  Patient tolerance: Patient tolerated the procedure well with no immediate complications.     Lottie Mussel, PA-C 05/27/13 1801

## 2013-05-30 LAB — WOUND CULTURE: Gram Stain: NONE SEEN

## 2013-06-02 ENCOUNTER — Telehealth: Payer: Self-pay

## 2013-06-02 NOTE — Telephone Encounter (Signed)
Pt returned lab call. Notified of labs

## 2013-06-08 ENCOUNTER — Other Ambulatory Visit: Payer: Self-pay | Admitting: Internal Medicine

## 2013-06-28 ENCOUNTER — Telehealth: Payer: Self-pay | Admitting: Internal Medicine

## 2013-06-28 NOTE — Telephone Encounter (Signed)
Pt was returning pt call regarding leg bruise.  There was no answer and RN was able to leave message for pt to call office back if needed.

## 2013-07-26 ENCOUNTER — Telehealth: Payer: Self-pay | Admitting: Internal Medicine

## 2013-07-26 NOTE — Telephone Encounter (Signed)
Caller: Hatem/Patient; Phone: [phone]; Reason for Call: Missed 2 calls from office

## 2013-07-27 ENCOUNTER — Ambulatory Visit (INDEPENDENT_AMBULATORY_CARE_PROVIDER_SITE_OTHER): Payer: Managed Care, Other (non HMO) | Admitting: Family Medicine

## 2013-07-27 ENCOUNTER — Telehealth: Payer: Self-pay

## 2013-07-27 ENCOUNTER — Encounter: Payer: Self-pay | Admitting: Family Medicine

## 2013-07-27 VITALS — BP 122/80 | HR 72 | Temp 98.4°F | Wt 150.0 lb

## 2013-07-27 DIAGNOSIS — M546 Pain in thoracic spine: Secondary | ICD-10-CM

## 2013-07-27 MED ORDER — HYDROCODONE-ACETAMINOPHEN 5-325 MG PO TABS: 1.0000 | ORAL_TABLET | Freq: Four times a day (QID) | ORAL | Status: DC | PRN

## 2013-07-27 NOTE — Telephone Encounter (Signed)
Please investigate and call patient if needed

## 2013-07-27 NOTE — Telephone Encounter (Signed)
Noted  

## 2013-07-27 NOTE — Telephone Encounter (Signed)
[facility] Triage Call Report Triage Record Num: [id number] Operator: Geanie Berlin Patient Name: Ryan Spencer Call Date & Time: 07/26/2013 5:02:51PM Patient Phone: [phone] PCP: Gordy Savers Patient Gender: Male PCP Fax : [phone] Patient DOB: 1986-08-10 Practice Name: [facility] Reason for Call: Emergent Caller: [place]/Patient; PCP: Eleonore Chiquito (Family Practice > 86yrs old); CB#: [phone]; Call regarding Back Pain; Onset: 07/22/13. Afebrile. Pain is mid back between waist and shoulder and worsens with walking or more activitiy. Back pain rated 5-6/10 while resting and 8-9/10 when active interrupting work. Currently at work but going home now at end of shift. Has been working longer hours in [employer]. Called for pain medication. Tried 600 mg Ibuprofen without improvement. Advised to see MD within 24 hours for moderate pain in back with activity and rest not responding to 72 hours of home care per Back Symptoms. Prefers not waiting until 1600 to see Dr Amador Cunas; scheduled for 0945 on 07/27/13 with Dr Caryl Never. Protocol(s) Used: Back Symptoms Recommended Outcome per Protocol: See Provider within 72 Hours Override Outcome if Used in Protocol: See Provider within 24 hours RN Reason for Override Outcome: Nursing Judgement Used. Reason for Outcome: Mild to moderate pain in back with normal activity or rest AND not responding to 72 hours of home care Care Advice: ~ Call provider if symptoms worsen or new symptoms develop. ~ Avoid heavy lifting, bending and twisting of the back, and prolonged sitting until evaluated by provider. Apply a cloth-covered cold or ice pack to the area for 20 minutes 4 to 8 times a day for relief of pain for the first 24-48 hours. After 24 to 48 hours of cold application, use a cloth-covered heat pack to the area for 20 minutes 3 to 4 times a day. ~ Sleep on a moderately firm mattress. Try sleeping on back with a pillow  placed under knees or sleep on side with knees bent and a pillow between knees. When lying on stomach, place pillow under the abdomen and pelvis; do not use a pillow for your head. ~ Go to the ED if you have worsening pain, numbness or weakness of arms or legs, cannot walk, or have new unexplained changes in bladder or bowel function. Another adult should drive. ~ ~ Avoid activity that causes or worsens symptoms. ~ SYMPTOM / CONDITION MANAGEMENT ~ CAUTIONS Analgesic/Antipyretic Advice - NSAIDs: Consider aspirin, ibuprofen, naproxen or ketoprofen for pain or fever as directed on label or by pharmacist/provider. PRECAUTIONS: - If over 31 years of age, should not take longer than 1 week without consulting provider. EXCEPTIONS: - Should not be used if taking blood thinners or have bleeding problems. - Do not use if have history of sensitivity/allergy to any of these medications; or history of cardiovascular, ulcer, kidney, liver disease or diabetes unless approved by provider. - Do not exceed recommended dose or frequency. ~ 07/26/2013 5:23:15PM Page 1 of 2 [id number] [facility] Triage Call Report Patient Name: Ryan Spencer continuation page/s 10/

## 2013-07-27 NOTE — Progress Notes (Signed)
  Subjective:    Patient ID: Ryan Spencer, male    DOB: 09/14/1986, 27 y.o.   MRN: [id number]  HPI Patient seen with persistent thoracic back pain Onset a few months ago. He denies any specific injury but job does require a lot of lifting. Location is lower thoracic region. Achy to sharp quality and occasionally severe. Pain is 8/10 it is worse Pain radiates somewhat bilateral. He was given Robaxin which did not help. Some relief with hydrocodone. He has not taken any in over 1 month. Denies any appetite or weight changes. No fever or chills.  He denies any lumbar back pain or cervical neck pain he's tried anti-inflammatories with meloxicam which did not help  Past Medical History  Diagnosis Date  . MRSA (methicillin resistant staph aureus) culture positive   . Abscess    No past surgical history on file.  reports that he has been smoking Cigarettes.  He has been smoking about 0.90 packs per day. He has never used smokeless tobacco. He reports that  drinks alcohol. He reports that he does not use illicit drugs. family history includes Arthritis in his mother. Allergies  Allergen Reactions  . Tramadol Other (See Comments)    Headache      Review of Systems  Constitutional: Negative for fever, activity change, appetite change and unexpected weight change.  Respiratory: Negative for cough and shortness of breath.   Cardiovascular: Negative for chest pain and leg swelling.  Gastrointestinal: Negative for vomiting and abdominal pain.  Genitourinary: Negative for dysuria, hematuria and flank pain.  Musculoskeletal: Positive for back pain. Negative for joint swelling.  Neurological: Negative for weakness and numbness.       Objective:   Physical Exam  Constitutional: He appears well-developed and well-nourished.  Cardiovascular: Normal rate and regular rhythm.   Pulmonary/Chest: Effort normal and breath sounds normal. No respiratory distress. He has no wheezes. He has no  rales.  Musculoskeletal:  No spinal tenderness. Minimal bilateral lower thoracic muscular tenderness.  Neurological:  Normal sensory function throughout thoracic region          Assessment & Plan:  Thoracic back pain. Patient has somewhat unusual location lower thoracic region. Suspect is a soft tissue/muscular . Given duration of symptoms, plain x-rays of thoracic spine. Refill Limited hydrocodone #30 tablets with no refill. Set up physical therapy

## 2013-07-27 NOTE — Telephone Encounter (Signed)
Pt is scheduled to see Dr. Caryl Never this morning.

## 2013-07-31 ENCOUNTER — Ambulatory Visit (INDEPENDENT_AMBULATORY_CARE_PROVIDER_SITE_OTHER)
Admission: RE | Admit: 2013-07-31 | Discharge: 2013-07-31 | Disposition: A | Discharge status: Home / Self Care | Payer: Managed Care, Other (non HMO) | Source: Ambulatory Visit | Attending: Family Medicine | Admitting: Family Medicine

## 2013-07-31 DIAGNOSIS — M546 Pain in thoracic spine: Secondary | ICD-10-CM

## 2013-07-31 IMAGING — CR DG THORACIC SPINE 2V
4 series · 4 of 4 positions shown · non-contrast
Comparison: No priors.

CLINICAL DATA: Injury to back 3 months ago. Mid back pain.

EXAM:
THORACIC SPINE - 2 VIEW

[view not recorded (1 of 4)]
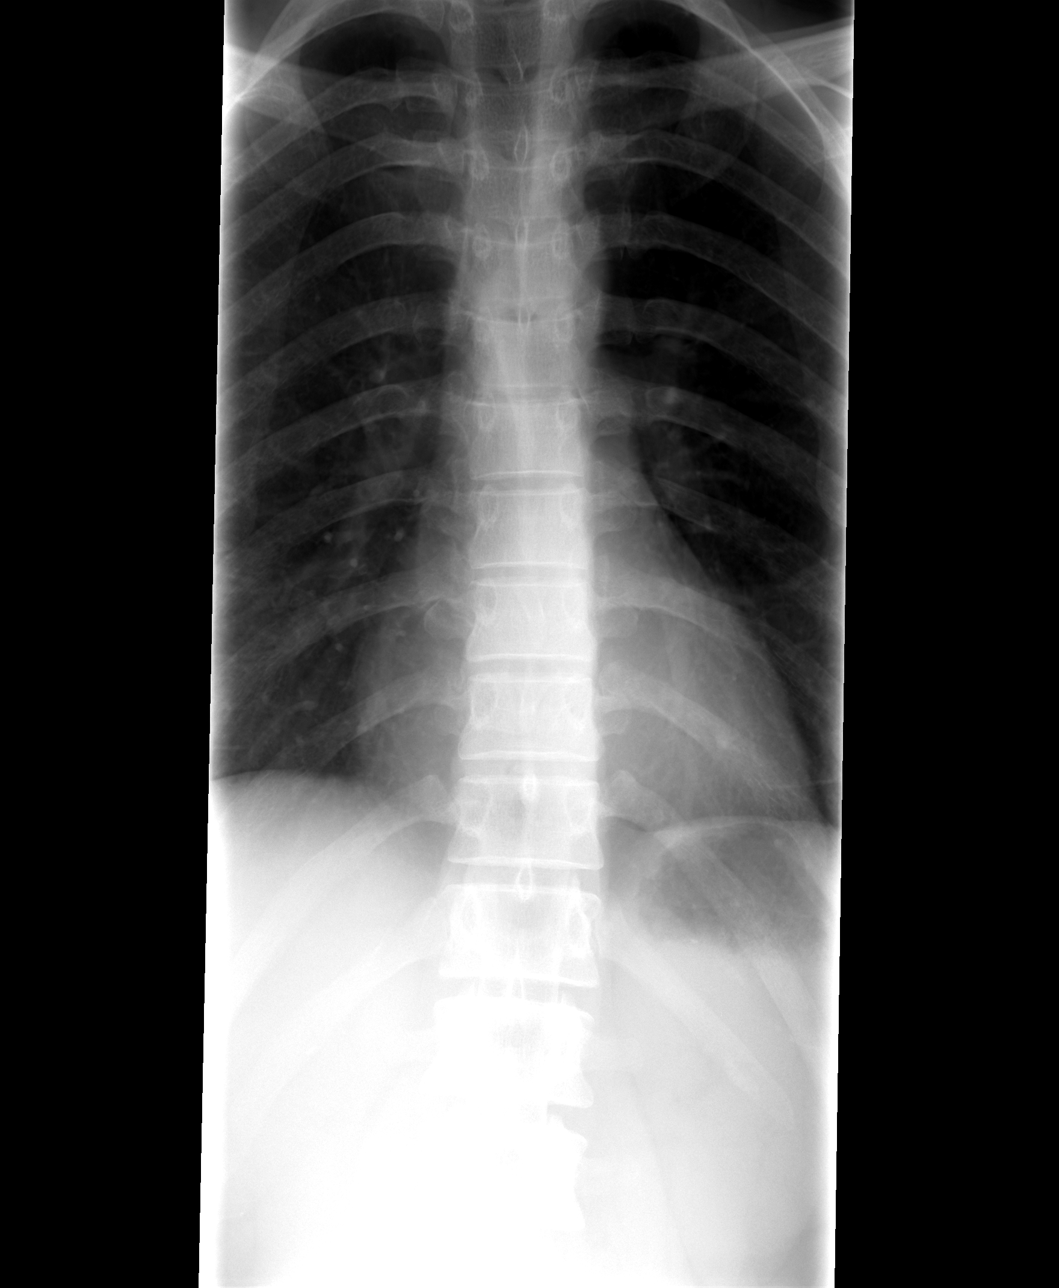

[view not recorded (2 of 4)]
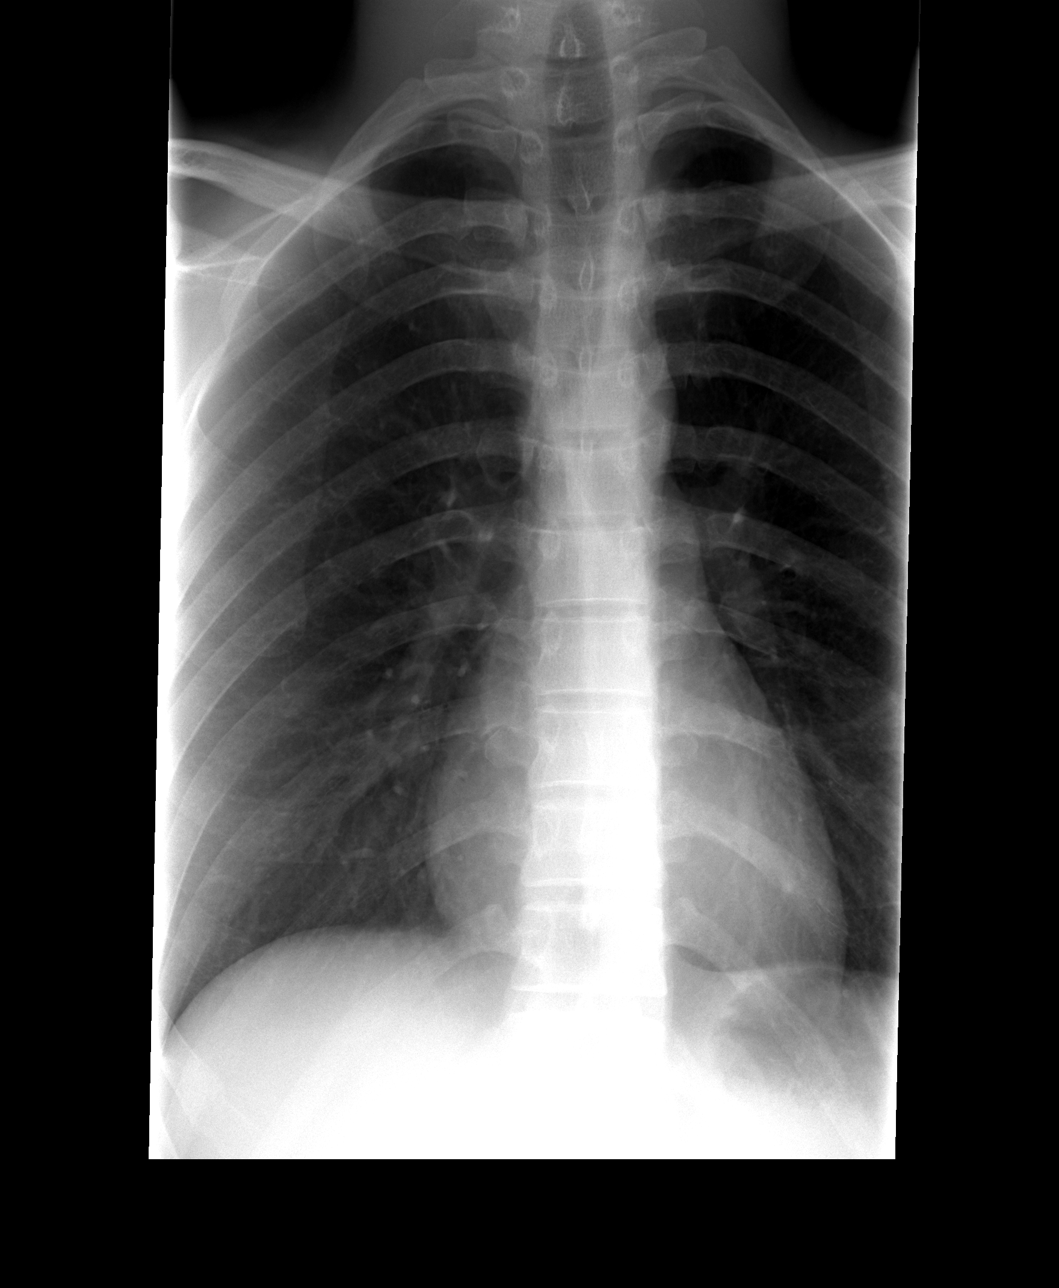

[view not recorded (3 of 4)]
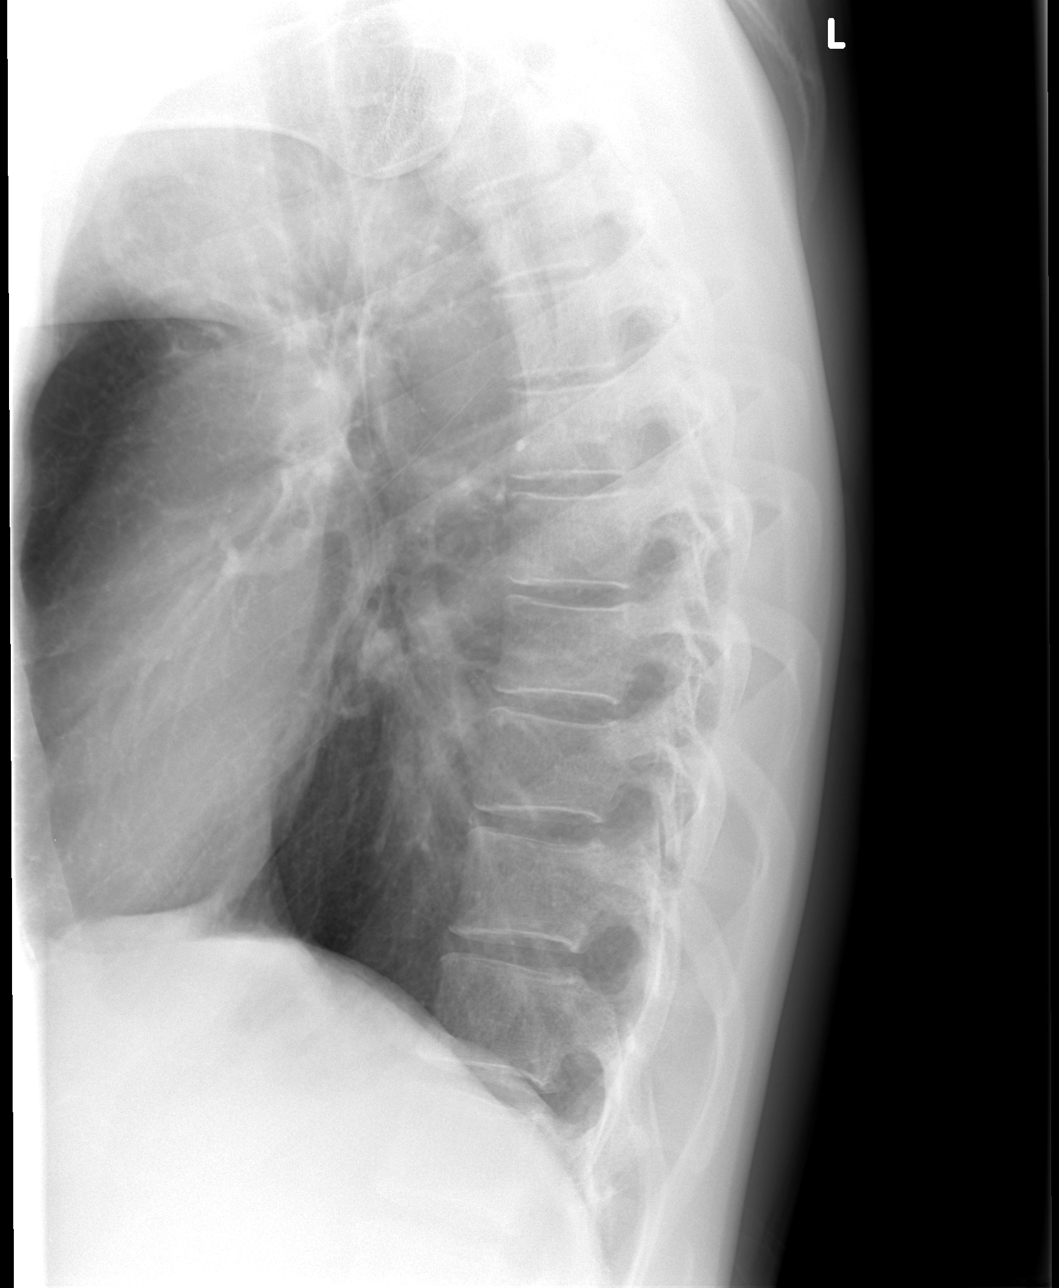

[view not recorded (4 of 4)]
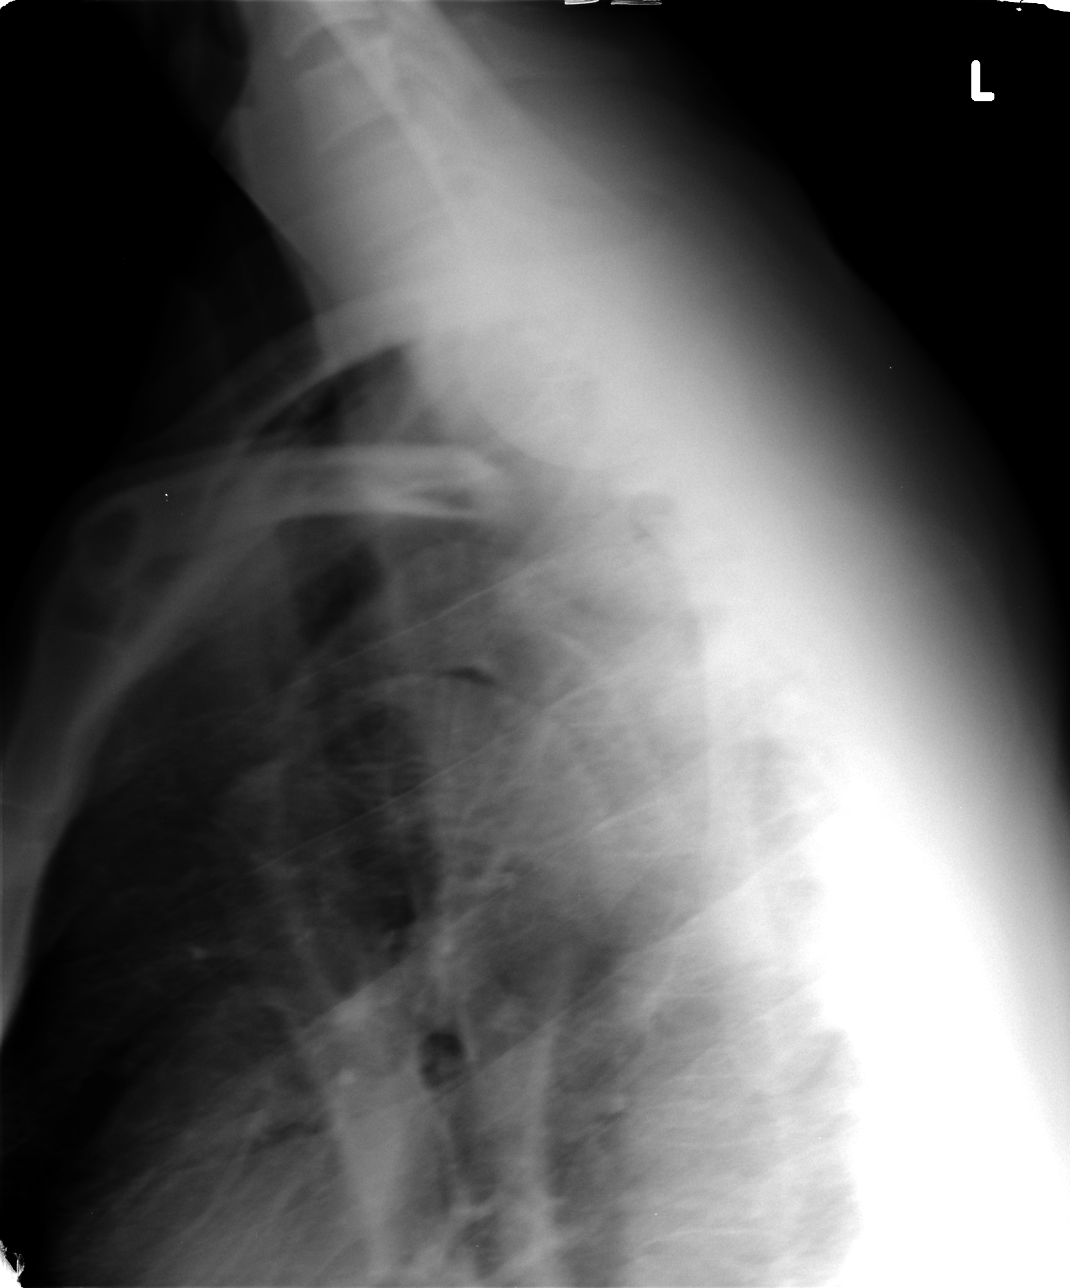

[4 of 4 positions shown; findings below may reference images not displayed]

FINDINGS: There is no evidence of thoracic spine fracture. Alignment is
normal. No other significant bone abnormalities are identified.
IMPRESSION: No acute radiographic abnormality of the thoracic spine to account
for the patient's symptoms.

## 2013-08-01 ENCOUNTER — Ambulatory Visit: Payer: Managed Care, Other (non HMO) | Attending: Family Medicine

## 2013-08-01 DIAGNOSIS — R5381 Other malaise: Secondary | ICD-10-CM | POA: Insufficient documentation

## 2013-08-01 DIAGNOSIS — IMO0001 Reserved for inherently not codable concepts without codable children: Secondary | ICD-10-CM | POA: Insufficient documentation

## 2013-08-01 DIAGNOSIS — M545 Low back pain, unspecified: Secondary | ICD-10-CM | POA: Insufficient documentation

## 2013-08-01 DIAGNOSIS — M546 Pain in thoracic spine: Secondary | ICD-10-CM | POA: Insufficient documentation

## 2013-08-02 ENCOUNTER — Ambulatory Visit (INDEPENDENT_AMBULATORY_CARE_PROVIDER_SITE_OTHER): Payer: Managed Care, Other (non HMO) | Admitting: Family Medicine

## 2013-08-02 ENCOUNTER — Encounter: Payer: Self-pay | Admitting: Family Medicine

## 2013-08-02 ENCOUNTER — Ambulatory Visit: Payer: Managed Care, Other (non HMO)

## 2013-08-02 VITALS — BP 126/68 | HR 91 | Temp 98.3°F | Wt 153.0 lb

## 2013-08-02 DIAGNOSIS — M545 Low back pain: Secondary | ICD-10-CM

## 2013-08-02 MED ORDER — METAXALONE 800 MG PO TABS: 800.0000 mg | ORAL_TABLET | Freq: Three times a day (TID) | ORAL | Status: DC

## 2013-08-02 NOTE — Patient Instructions (Signed)
Touch base in two weeks if back pain no better.

## 2013-08-02 NOTE — Progress Notes (Signed)
  Subjective:    Patient ID: Ryan Spencer, male    DOB: [birth year], 27 y.o.   MRN: [id number]  HPI Patient seen for followup regarding his back pain. His job requires lots of lifting.  No specific injury. Back pain started out thoracic region and now is more bilateral lumbar. Recent thoracic x-rays did not reveal any acute abnormality. He gets relief with hydrocodone. He has not gotten relief with Tylenol or meloxicam. He took Robaxin but had increased sedation. Started physical therapy earlier today. He was told he was having " spasms". Patient denies any radiculopathy symptoms. No fevers or chills. No appetite changes. No abdominal pain  Past Medical History  Diagnosis Date  . MRSA (methicillin resistant staph aureus) culture positive   . Abscess    No past surgical history on file.  reports that he has been smoking Cigarettes.  He has been smoking about 0.90 packs per day. He has never used smokeless tobacco. He reports that he drinks alcohol. He reports that he does not use illicit drugs. family history includes Arthritis in his mother. Allergies  Allergen Reactions  . Tramadol Other (See Comments)    Headache      Review of Systems  Constitutional: Negative for fever and chills.  Gastrointestinal: Negative for abdominal pain.  Genitourinary: Negative for dysuria.  Musculoskeletal: Positive for back pain.       Objective:   Physical Exam  Constitutional: He appears well-developed and well-nourished.  Cardiovascular: Normal rate.   Musculoskeletal:  Straight leg raises are negative bilaterally. Patient has no spinal tenderness. He has some mild bilateral lower lumbar tenderness  Neurological:  Deep tender reflexes are symmetric lower extremities. Full strength throughout.          Assessment & Plan:  Low back pain. Suspect musculoskeletal origin. Continue physical therapy. Add Skelaxin 800 mg every 8 hours as needed for spasm. We discussed limitation of  hydrocodone. Touch base in 2 weeks if not improving

## 2013-08-04 ENCOUNTER — Telehealth: Payer: Self-pay | Admitting: Internal Medicine

## 2013-08-04 ENCOUNTER — Ambulatory Visit: Payer: Managed Care, Other (non HMO) | Admitting: Physical Therapy

## 2013-08-04 NOTE — Telephone Encounter (Signed)
Pt needs new rx hydrocodone °

## 2013-08-04 NOTE — Telephone Encounter (Signed)
ok 

## 2013-08-04 NOTE — Telephone Encounter (Signed)
An rx was printed on 07/27/13 by Dr. Caryl Never.  Pls advise.

## 2013-08-06 ENCOUNTER — Ambulatory Visit: Payer: Managed Care, Other (non HMO)

## 2013-08-06 ENCOUNTER — Ambulatory Visit (INDEPENDENT_AMBULATORY_CARE_PROVIDER_SITE_OTHER): Payer: Managed Care, Other (non HMO) | Admitting: Emergency Medicine

## 2013-08-06 VITALS — BP 114/58 | HR 75 | Temp 98.8°F | Resp 16 | Ht 71.0 in | Wt 148.0 lb

## 2013-08-06 DIAGNOSIS — M79671 Pain in right foot: Secondary | ICD-10-CM

## 2013-08-06 DIAGNOSIS — M79609 Pain in unspecified limb: Secondary | ICD-10-CM

## 2013-08-06 DIAGNOSIS — S91301A Unspecified open wound, right foot, initial encounter: Secondary | ICD-10-CM

## 2013-08-06 DIAGNOSIS — S91309A Unspecified open wound, unspecified foot, initial encounter: Secondary | ICD-10-CM

## 2013-08-06 IMAGING — CR DG FOOT COMPLETE 3+V*R*
3 series · 3 of 3 positions shown · non-contrast
Comparison: None.

CLINICAL DATA: Right foot injury 1 day ago. Pain.

EXAM:
RIGHT FOOT COMPLETE - 3+ VIEW

[AP]
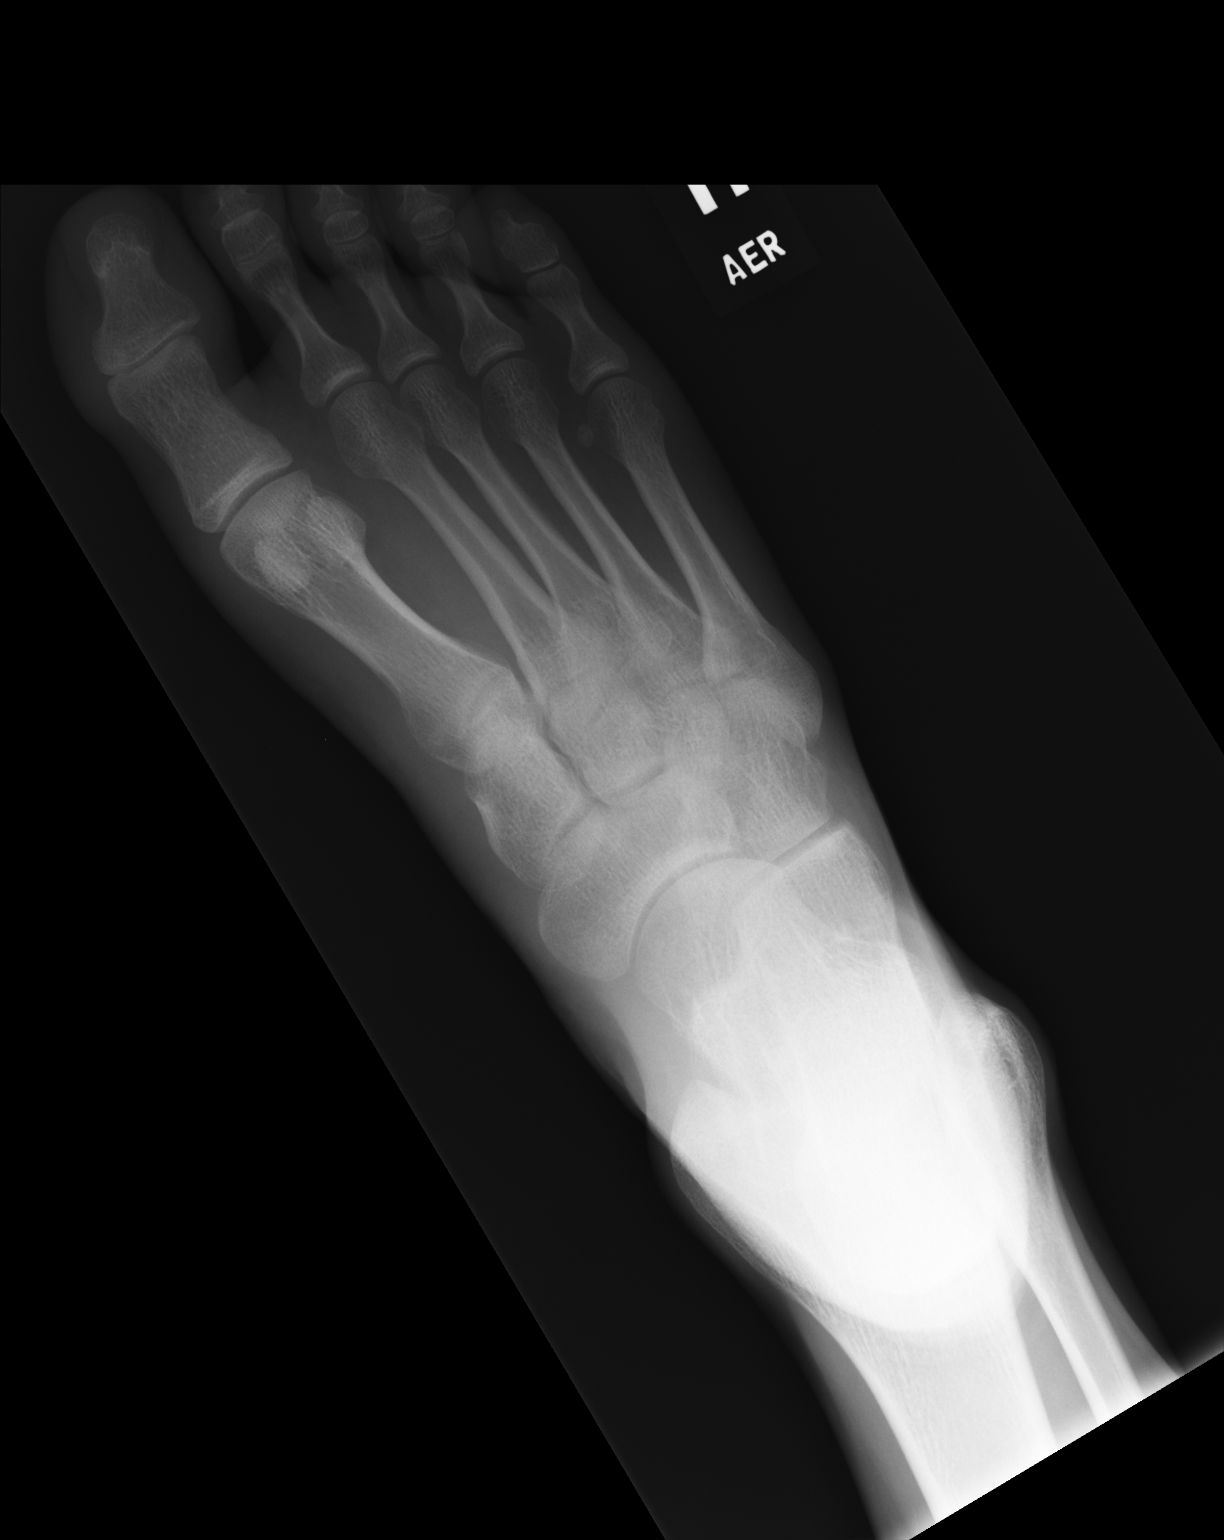

[ap obl int rot]
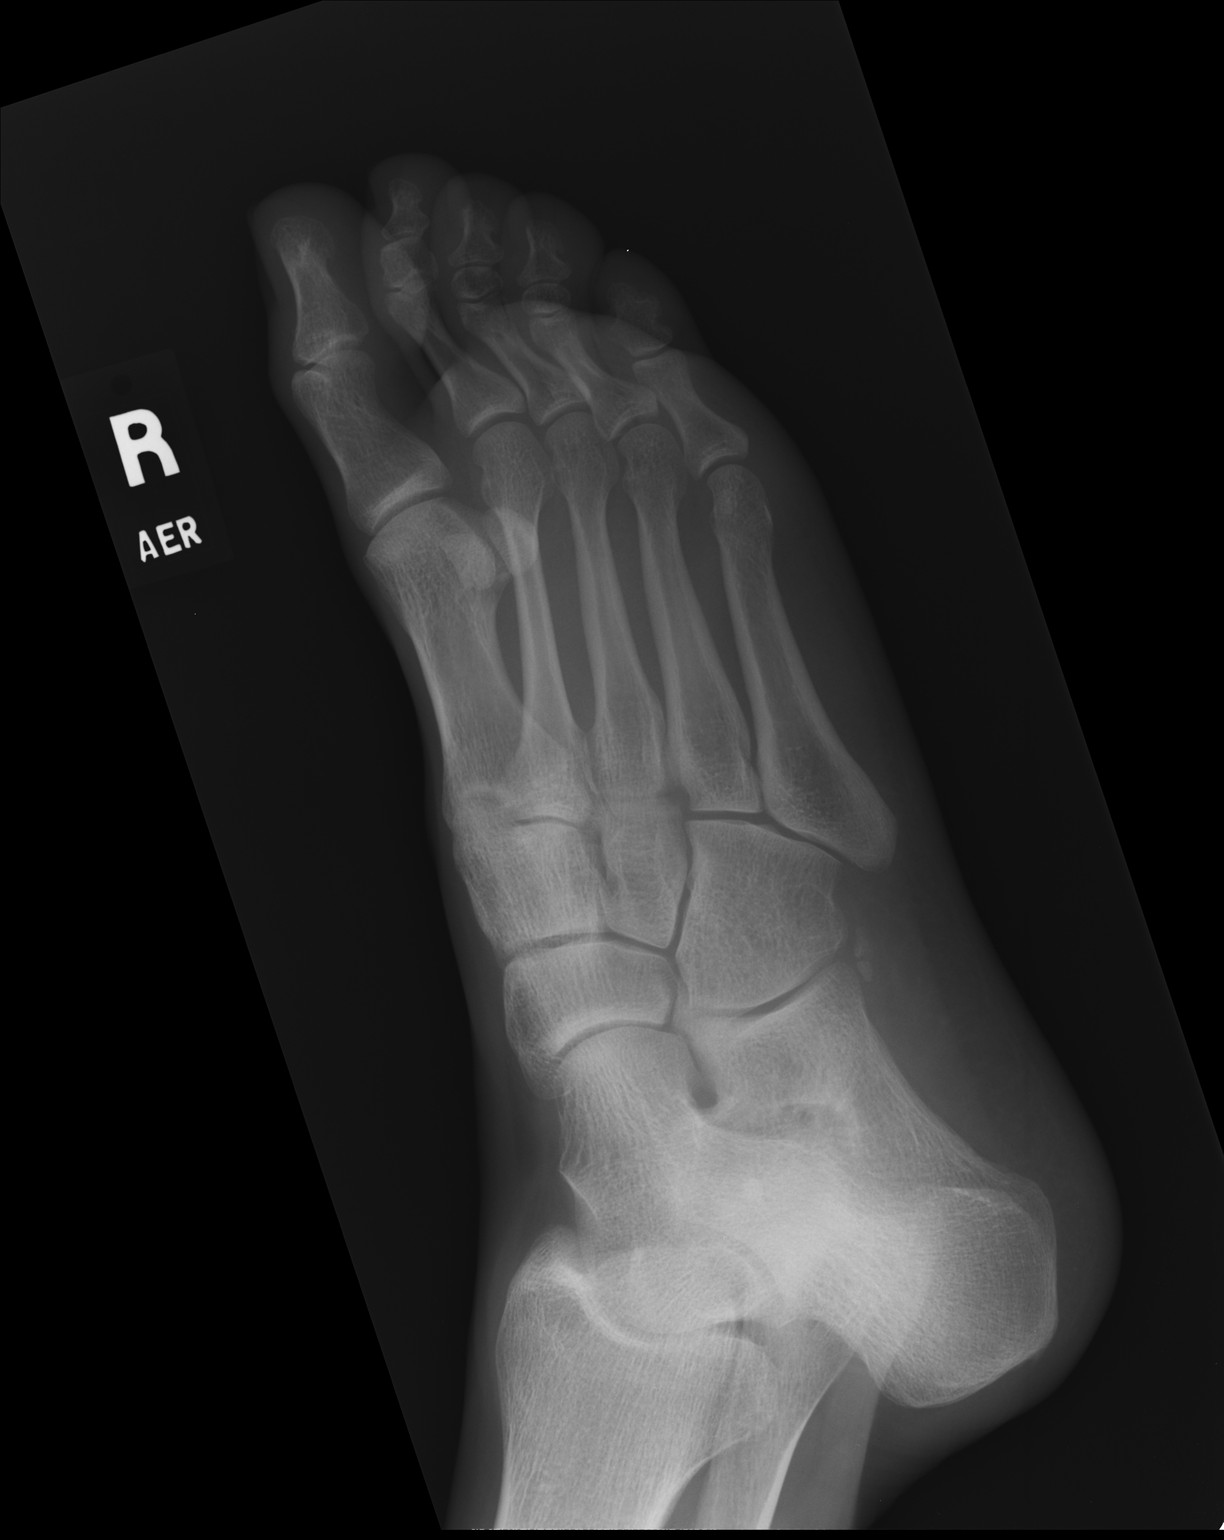

[lateral]
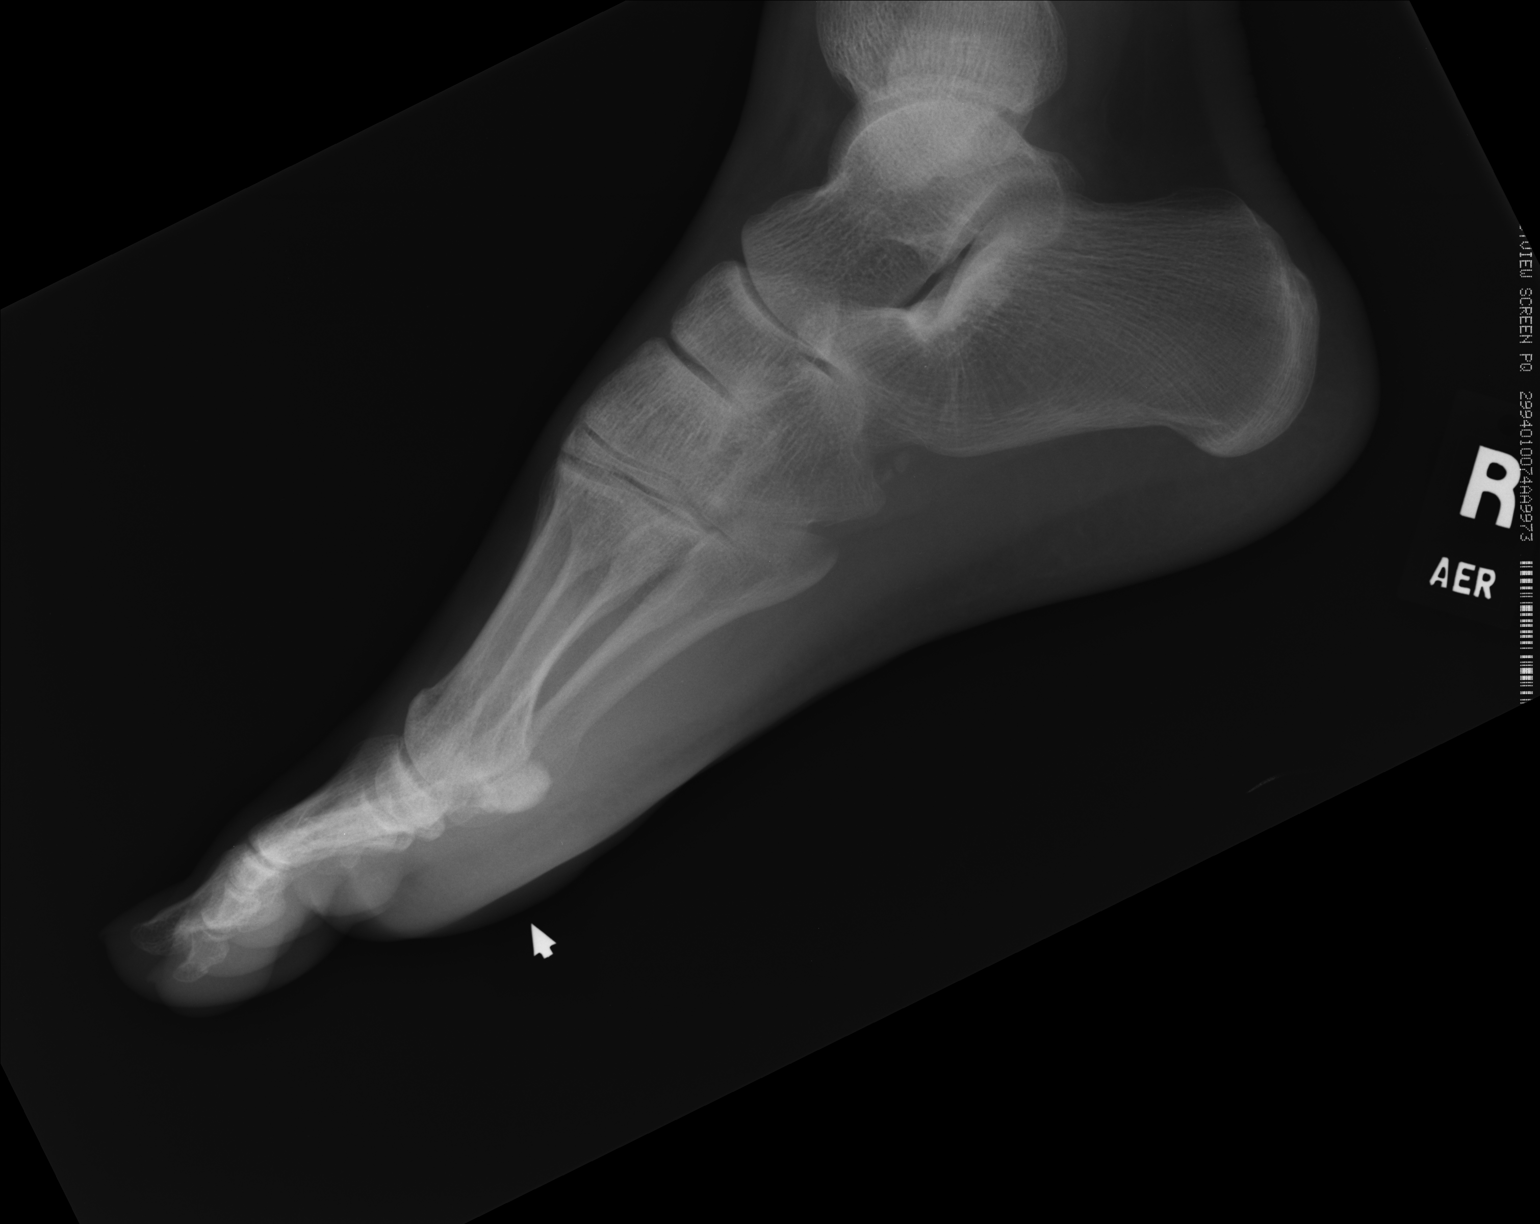

[3 of 3 positions shown; findings below may reference images not displayed]

FINDINGS: Imaged bones, joints and soft tissues appear normal. No radiopaque
foreign body is identified.
IMPRESSION: Negative exam.

## 2013-08-06 MED ORDER — CIPROFLOXACIN HCL 500 MG PO TABS: 500.0000 mg | ORAL_TABLET | Freq: Two times a day (BID) | ORAL | Status: DC

## 2013-08-06 MED ORDER — HYDROCODONE-ACETAMINOPHEN 5-325 MG PO TABS: 1.0000 | ORAL_TABLET | Freq: Four times a day (QID) | ORAL | Status: DC | PRN

## 2013-08-06 NOTE — Progress Notes (Signed)
Procedure:  Consent obtained - Local anesthesia with 1% lido with epi - #11 blade used to make X-shaped incision - wound app 1cm deep - foreign body was all superficial that was visible and area was irrigated superficial to clean dirty from superficial wound.  Small amount of purulent discharge came from wound right after incision - wound culture obtained - drsg placed.  Wound care d/w pt.

## 2013-08-06 NOTE — Patient Instructions (Signed)
Puncture Wound °A puncture wound is an injury that extends through all layers of the skin and into the tissue beneath the skin (subcutaneous tissue). Puncture wounds become infected easily because germs often enter the body and go beneath the skin during the injury. Having a deep wound with a small entrance point makes it difficult for your caregiver to adequately clean the wound. This is especially true if you have stepped on a nail and it has passed through a dirty shoe or other situations where the wound is obviously contaminated. °CAUSES  °Many puncture wounds involve glass, nails, splinters, fish hooks, or other objects that enter the skin (foreign bodies). A puncture wound may also be caused by a human bite or animal bite. °DIAGNOSIS  °A puncture wound is usually diagnosed by your history and a physical exam. You may need to have an X-ray or an ultrasound to check for any foreign bodies still in the wound. °TREATMENT  °· Your caregiver will clean the wound as thoroughly as possible. Depending on the location of the wound, a bandage (dressing) may be applied. °· Your caregiver might prescribe antibiotic medicines. °· You may need a follow-up visit to check on your wound. Follow all instructions as directed by your caregiver. °HOME CARE INSTRUCTIONS  °· Change your dressing once per day, or as directed by your caregiver. If the dressing sticks, it may be removed by soaking the area in water. °· If your caregiver has given you follow-up instructions, it is very important that you return for a follow-up appointment. Not following up as directed could result in a chronic or permanent injury, pain, and disability. °· Only take over-the-counter or prescription medicines for pain, discomfort, or fever as directed by your caregiver. °· If you are given antibiotics, take them as directed. Finish them even if you start to feel better. °You may need a tetanus shot if: °· You cannot remember when you had your last tetanus  shot. °· You have never had a tetanus shot. °If you got a tetanus shot, your arm may swell, get red, and feel warm to the touch. This is common and not a problem. If you need a tetanus shot and you choose not to have one, there is a rare chance of getting tetanus. Sickness from tetanus can be serious. °You may need a rabies shot if an animal bite caused your puncture wound. °SEEK MEDICAL CARE IF:  °· You have redness, swelling, or increasing pain in the wound. °· You have red streaks going away from the wound. °· You notice a bad smell coming from the wound or dressing. °· You have yellowish-white fluid (pus) coming from the wound. °· You are treated with an antibiotic for infection, but the infection is not getting better. °· You notice something in the wound, such as rubber from your shoe, cloth, or another object. °· You have a fever. °· You have severe pain. °· You have difficulty breathing. °· You feel dizzy or faint. °· You cannot stop vomiting. °· You lose feeling, develop numbness, or cannot move a limb below the wound. °· Your symptoms worsen. °MAKE SURE YOU: °· Understand these instructions. °· Will watch your condition. °· Will get help right away if you are not doing well or get worse. °Document Released: 07/22/2005 Document Revised: 01/04/2012 Document Reviewed: 03/31/2011 °Exit[employer]® Patient Information ©2014 [employer]. ° °

## 2013-08-06 NOTE — Progress Notes (Addendum)
Subjective:  This chart was scribed for Collene Gobble, MD by Arlan Organ, ED Scribe. This patient was seen in room Room 14 and the patient's care was started 3:27 PM.    Patient ID: Ryan Spencer, male    DOB: November 25, 1985, 27 y.o.   MRN: [id number]  HPI HPI Comments: Ryan Spencer is a 27 y.o. male who presents to [facility] complaining of a right foot injury that occured yesterday around 9 PM. Pt states he stepped on a rake. He says he was wearing leather shoes, and the rake  penetrated completely through his shoe and into his foot. Pt states he is unable to move his foot completely due to the injury. Pt denies weakness.  Review of Systems  Skin: Positive for wound.  Neurological: Negative for numbness.    Past Medical History  Diagnosis Date  . MRSA (methicillin resistant staph aureus) culture positive   . Abscess     History   Social History  . Marital Status: Married    Spouse Name: N/A    Number of Children: N/A  . Years of Education: N/A   Occupational History  . Not on file.   Social History Main Topics  . Smoking status: Current Every Day Smoker -- 0.90 packs/day    Types: Cigarettes  . Smokeless tobacco: Never Used  . Alcohol Use: Yes     Comment: occasional  . Drug Use: No  . Sexual Activity: Not on file   Other Topics Concern  . Not on file   Social History Narrative   ** Merged History Encounter **        No past surgical history on file.   Results for orders placed during the hospital encounter of 05/27/13  CBC WITH DIFFERENTIAL      Result Value Range   WBC 10.0  4.0 - 10.5 K/uL   RBC 4.19 (*) 4.22 - 5.81 MIL/uL   Hemoglobin 13.1  13.0 - 17.0 g/dL   HCT 40.9 (*) 81.1 - 91.4 %   MCV 90.5  78.0 - 100.0 fL   MCH 31.3  26.0 - 34.0 pg   MCHC 34.6  30.0 - 36.0 g/dL   RDW 78.2  95.6 - 21.3 %   Platelets 183  150 - 400 K/uL   Neutrophils Relative % 59  43 - 77 %   Neutro Abs 5.9  1.7 - 7.7 K/uL   Lymphocytes Relative 30  12 - 46 %   Lymphs Abs 3.0   0.7 - 4.0 K/uL   Monocytes Relative 8  3 - 12 %   Monocytes Absolute 0.8  0.1 - 1.0 K/uL   Eosinophils Relative 3  0 - 5 %   Eosinophils Absolute 0.3  0.0 - 0.7 K/uL   Basophils Relative 0  0 - 1 %   Basophils Absolute 0.0  0.0 - 0.1 K/uL  BASIC METABOLIC PANEL      Result Value Range   Sodium 139  135 - 145 mEq/L   Potassium 3.6  3.5 - 5.1 mEq/L   Chloride 100  96 - 112 mEq/L   CO2 30  19 - 32 mEq/L   Glucose, Bld 92  70 - 99 mg/dL   BUN 10  6 - 23 mg/dL   Creatinine, Ser 0.86  0.50 - 1.35 mg/dL   Calcium 9.4  8.4 - 57.8 mg/dL   GFR calc non Af Amer 80 (*) >90 mL/min   GFR calc Af Amer >90  >  90 mL/min       Objective:   Physical Exam  Constitutional: He is oriented to person, place, and time. He appears well-developed and well-nourished. No distress.  HENT:  Head: Normocephalic.  Eyes: Conjunctivae are normal. Pupils are equal, round, and reactive to light. No scleral icterus.  Neck: Normal range of motion. Neck supple. No thyromegaly present.  Cardiovascular: Normal rate and regular rhythm.  Exam reveals no gallop and no friction rub.   No murmur heard. Pulmonary/Chest: Effort normal and breath sounds normal. No respiratory distress. He has no wheezes. He has no rales.  Abdominal: Soft. Bowel sounds are normal. He exhibits no distension. There is no tenderness. There is no rebound.  Musculoskeletal: Normal range of motion.  Sever pain with extension of the great toe  Neurological: He is alert and oriented to person, place, and time.  Skin: Skin is warm and dry. No rash noted.  signifcant over ball of foot behind the right great toe 1/2 cm by 1/2 cm of dark debris in the wound   Psychiatric: He has a normal mood and affect. His behavior is normal.    [facility] reading (PRIMARY) by  Dr.Vaden Becherer  no foreign body seen  . Tdap was given December 2000 Assessment & Plan:  The patient to do soaks soap and water cleaning he will be on Cipro, and hydrocodone recheck 24 hours.

## 2013-08-07 ENCOUNTER — Ambulatory Visit: Payer: Managed Care, Other (non HMO)

## 2013-08-07 ENCOUNTER — Ambulatory Visit (INDEPENDENT_AMBULATORY_CARE_PROVIDER_SITE_OTHER): Payer: Managed Care, Other (non HMO) | Admitting: Internal Medicine

## 2013-08-07 VITALS — BP 112/60 | HR 75 | Temp 98.2°F | Resp 16

## 2013-08-07 DIAGNOSIS — S91309D Unspecified open wound, unspecified foot, subsequent encounter: Secondary | ICD-10-CM

## 2013-08-07 DIAGNOSIS — S91309A Unspecified open wound, unspecified foot, initial encounter: Secondary | ICD-10-CM

## 2013-08-07 MED ORDER — OXYCODONE-ACETAMINOPHEN 10-325 MG PO TABS: 1.0000 | ORAL_TABLET | Freq: Every day | ORAL | Status: DC

## 2013-08-07 NOTE — Telephone Encounter (Signed)
Left message on voicemail to call office. Pt can not have refill of Hydrocodone was seen at Urgent care yesterday and was given Rx.

## 2013-08-07 NOTE — Patient Instructions (Signed)
See Dr Cleta Alberts Thursday morning at [phone]//return sooner if redness spreading We'll call if antibiotic change is indicated

## 2013-08-07 NOTE — Progress Notes (Signed)
  Subjective:    Patient ID: Ryan Spencer, male    DOB: [birth year], 27 y.o.   MRN: [id number]  HPI  HPI Comments: Ryan Spencer is a 27 y.o. male who presents to the [facility] complaining of a wound check. Pt was seen here yesterday after being punctured on the right foot under the great toe by a rake. He was prescribed Cipro and hydrocodone and cultures were taken. Pt wants wound to be checked today due to worsened pain although movement has improved. He denies any fever, chills, or night sweats.     Review of Systems     Objective:   Physical Exam  BP 112/60  Pulse 75  Temp(Src) 98.2 F (36.8 C)  Resp 16  SpO2 98%  Wound remains open and is draining slightly purulent material. Tender to touch with redness extending 3 cm into plantar aspect.  Toe has good flexion without pain but extension is painful. Joint is not swollen.  No redness on the dorsal aspect.      Assessment & Plan:  Wound foot with 2dary infection  Cont meds Stronger pain meds at HS Soak foot 3-4 x day for 30 min Reck 3 d but sooner if progressive sign Cont cipro

## 2013-08-07 NOTE — Telephone Encounter (Signed)
Pt returned your call. Will be waiting on your call.

## 2013-08-07 NOTE — Telephone Encounter (Signed)
Spoke to pt told him can not refill Hydrocodone due to was just given some at Urgent care. Pt verbalized understanding and stated that is fine I am okay for now. Told pt okay call if need anything. Pt verbalized understanding.

## 2013-08-09 ENCOUNTER — Ambulatory Visit (INDEPENDENT_AMBULATORY_CARE_PROVIDER_SITE_OTHER): Payer: Managed Care, Other (non HMO) | Admitting: Physician Assistant

## 2013-08-09 VITALS — BP 120/82 | HR 110 | Temp 98.2°F | Resp 20 | Ht 71.0 in | Wt 150.2 lb

## 2013-08-09 DIAGNOSIS — L02619 Cutaneous abscess of unspecified foot: Secondary | ICD-10-CM

## 2013-08-09 DIAGNOSIS — L03119 Cellulitis of unspecified part of limb: Secondary | ICD-10-CM

## 2013-08-09 MED ORDER — DOXYCYCLINE HYCLATE 100 MG PO CAPS: 100.0000 mg | ORAL_CAPSULE | Freq: Two times a day (BID) | ORAL | Status: DC

## 2013-08-09 MED ORDER — HYDROCODONE-ACETAMINOPHEN 10-325 MG PO TABS: 1.0000 | ORAL_TABLET | Freq: Three times a day (TID) | ORAL | Status: DC | PRN

## 2013-08-09 NOTE — Progress Notes (Signed)
  Subjective:    Patient ID: Ryan Spencer, male    DOB: 1986/10/05, 27 y.o.   MRN: [id number]  HPI 27 year old male presents for recheck of right foot cellulitis. Seen here initially on 08/06/13 after stepping on a rake.  Placed on Cipro and hydrocodone for pain with instructions to recheck in 24 hours. On 08/07/13 pain was improving but wound still had some purulent drainage and surrounding erythema. No changes made at that time except the addition of oxycodone at bedtime.  Culture still pending.  Patient denies fever, chills, nausea, or vomiting.  Admits wound and redness have not gotten any worse, but also they do not appear to be improving.  Continues to ambulate with crutches and has significant pain to touch along the ball of his foot.  No longer has any purulent drainage.  Tolerating Cipro. He does have hx of MRSA infections.    Review of Systems  Constitutional: Negative for fever and chills.  Gastrointestinal: Negative for nausea and vomiting.  Musculoskeletal: Positive for gait problem (using crutches) and joint swelling.  Skin: Positive for color change and wound.  Neurological: Negative for dizziness.       Objective:   Physical Exam  Constitutional: He is oriented to person, place, and time. He appears well-developed and well-nourished.  HENT:  Head: Normocephalic and atraumatic.  Right Ear: External ear normal.  Left Ear: External ear normal.  Eyes: Conjunctivae are normal.  Cardiovascular: Normal rate.   Pulmonary/Chest: Effort normal.  Neurological: He is alert and oriented to person, place, and time.  Skin:  Small <0.5 cm wound on ball of foot with erythema extending across the arch. +TTP at 1st MTP. + pain with ROM of great toe. No purulence or fluctuance noted.   Psychiatric: He has a normal mood and affect. His behavior is normal. Judgment and thought content normal.      Culture pending. Preliminary report shows moderate gram positive cocci in clusters.      Assessment & Plan:   Cellulitis of foot  Continue Cipro and daily soaks Add doxycycline 100 mg bid x 10 days to cover possible MRSA cellulitis. Out of work note updated. If not significantly improved in 48 hours, to ER for IV antibiotics. Follow up sooner if worse.

## 2013-08-10 ENCOUNTER — Ambulatory Visit: Payer: Managed Care, Other (non HMO) | Admitting: Physical Therapy

## 2013-08-11 LAB — WOUND CULTURE: Gram Stain: NONE SEEN

## 2013-08-13 ENCOUNTER — Telehealth: Payer: Self-pay

## 2013-08-13 NOTE — Telephone Encounter (Signed)
Patient calling to request a refill on HYDROcodone-acetaminophen (NORCO) 10-325 MG per tablet . He last saw Herbert Seta for his foot 08/09/13. Please call when ready for pick up. Thank you!  Best number: [phone]

## 2013-08-13 NOTE — Telephone Encounter (Signed)
Called pt, advised to RTC for Rx. Pt understood.

## 2013-08-15 ENCOUNTER — Telehealth: Payer: Self-pay | Admitting: Internal Medicine

## 2013-08-15 NOTE — Telephone Encounter (Signed)
Left message on voicemail to call office.  

## 2013-08-15 NOTE — Telephone Encounter (Signed)
Spoke to pt told him needs to get Rx from whoever is treating him. I saw you called urgent care and they said you need to follow up to pick Rx up. Did you get Rx. Pt stated no and that was for his foot and that is better. Wanting Hydrocodone for his back that Dr. Caryl Never gave him some. Asked pt if following up with anyone about his back. Pt stated no but is starting PT. Told pt will check with Dr. Kirtland Bouchard if okay to refill and get back to him. Pt verbalized understanding.

## 2013-08-15 NOTE — Telephone Encounter (Signed)
Pt needs new rx hydrocodone °

## 2013-08-15 NOTE — Telephone Encounter (Signed)
60

## 2013-08-16 ENCOUNTER — Ambulatory Visit: Payer: Managed Care, Other (non HMO)

## 2013-08-16 ENCOUNTER — Ambulatory Visit: Payer: Managed Care, Other (non HMO) | Admitting: Physical Therapy

## 2013-08-16 ENCOUNTER — Ambulatory Visit (INDEPENDENT_AMBULATORY_CARE_PROVIDER_SITE_OTHER): Payer: Managed Care, Other (non HMO) | Admitting: Family Medicine

## 2013-08-16 VITALS — BP 114/62 | HR 78 | Temp 97.7°F | Resp 18 | Ht 71.0 in | Wt 145.0 lb

## 2013-08-16 DIAGNOSIS — L02619 Cutaneous abscess of unspecified foot: Secondary | ICD-10-CM

## 2013-08-16 DIAGNOSIS — L03115 Cellulitis of right lower limb: Secondary | ICD-10-CM

## 2013-08-16 IMAGING — CR DG FOOT COMPLETE 3+V*R*
3 series · 3 of 3 positions shown · non-contrast
Comparison: None.

CLINICAL DATA: Pain in foot. Recent puncture wound.

EXAM:
RIGHT FOOT COMPLETE - 3+ VIEW

[AP]
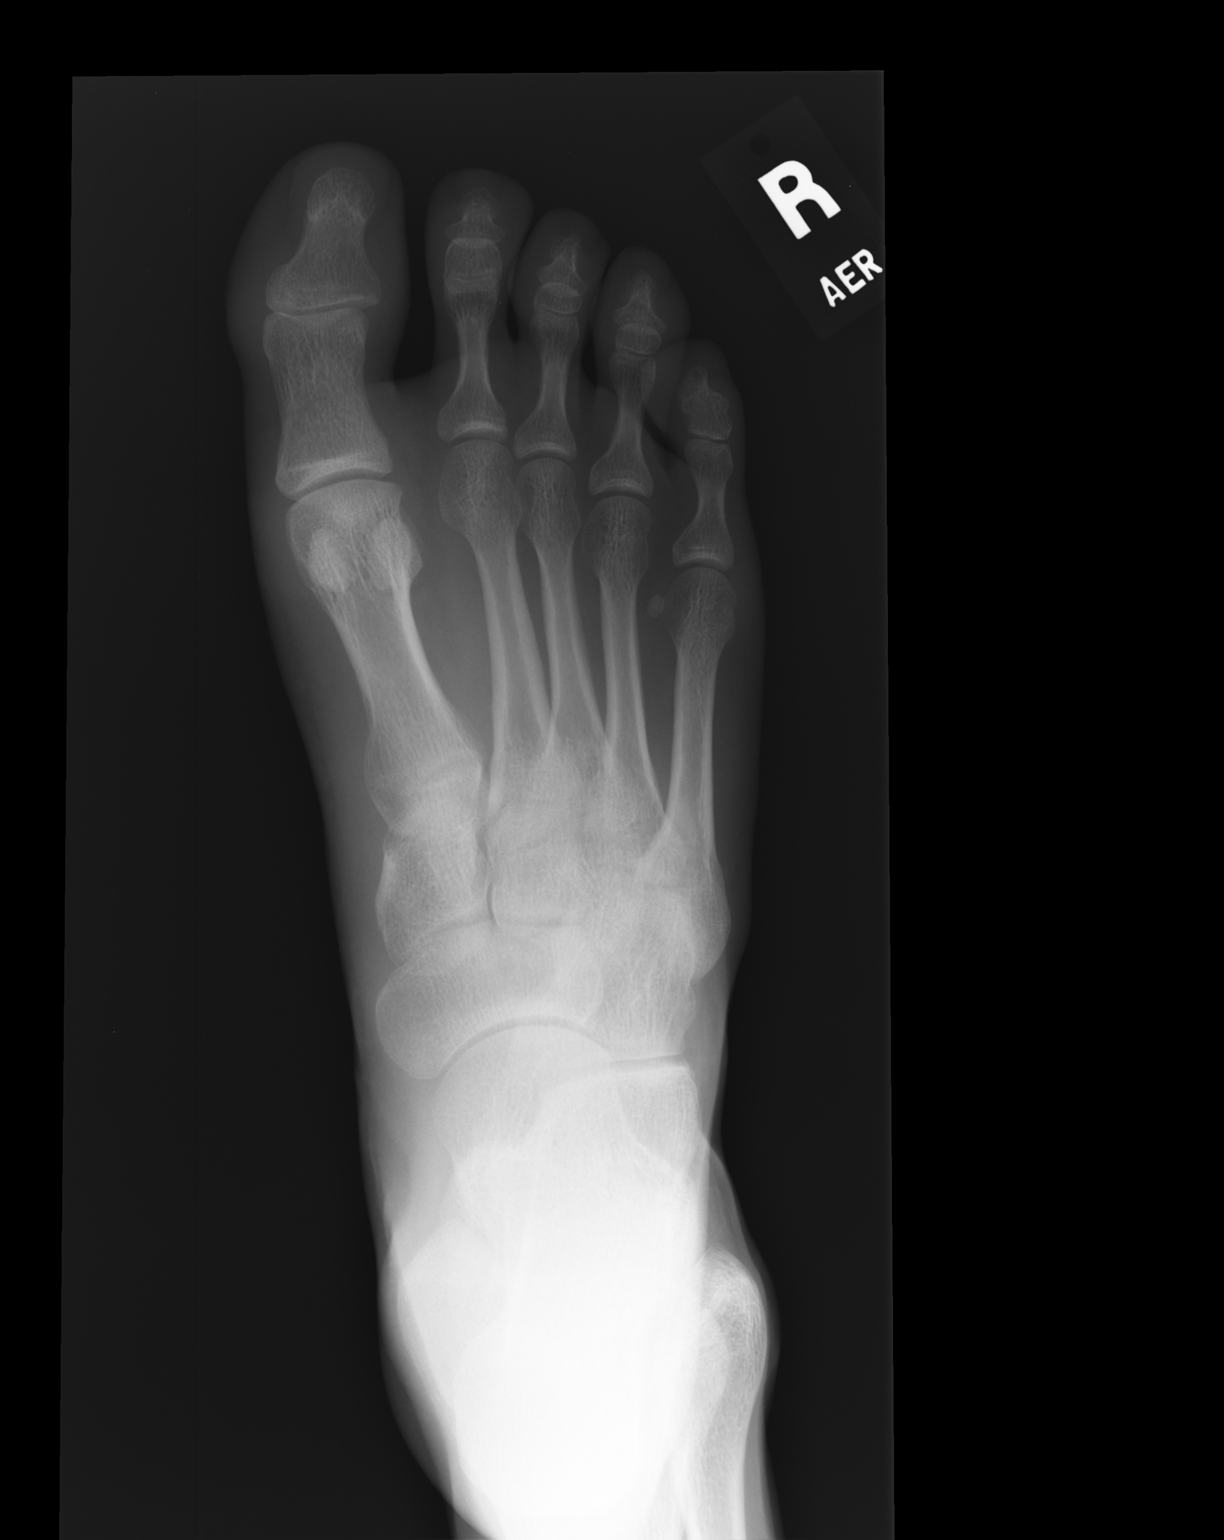

[ap obl int rot]
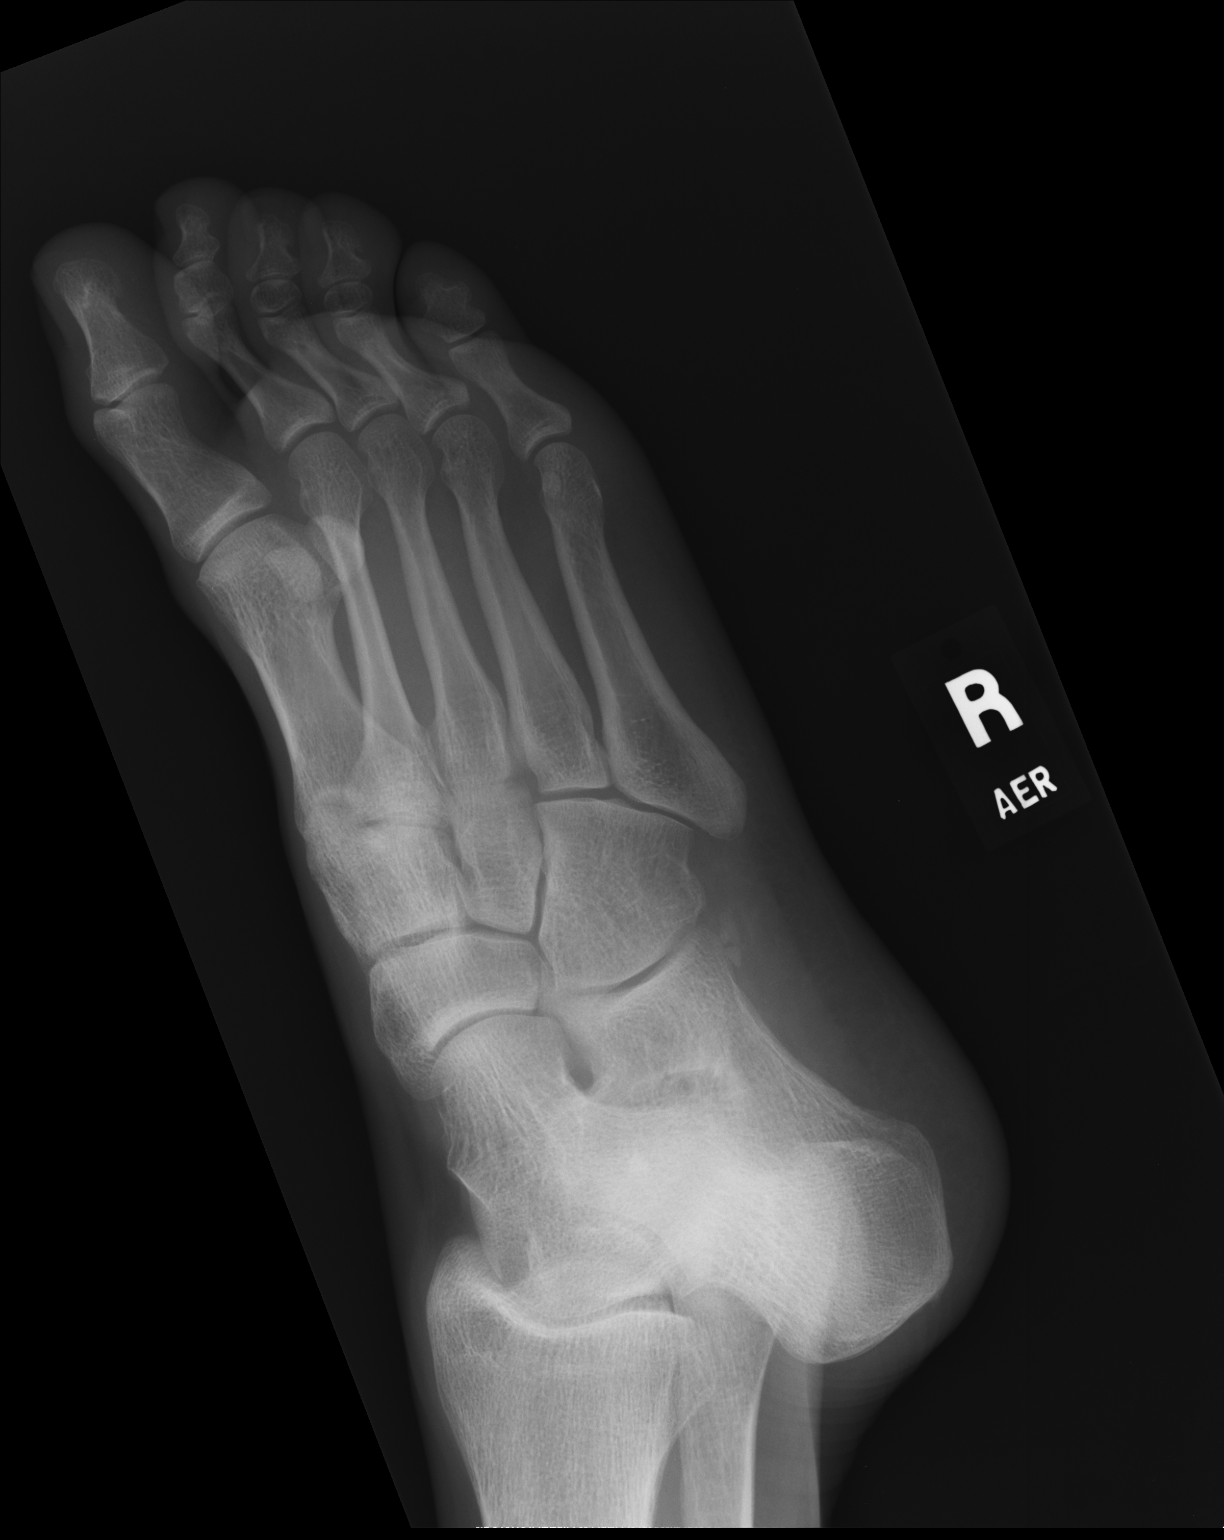

[lateral]
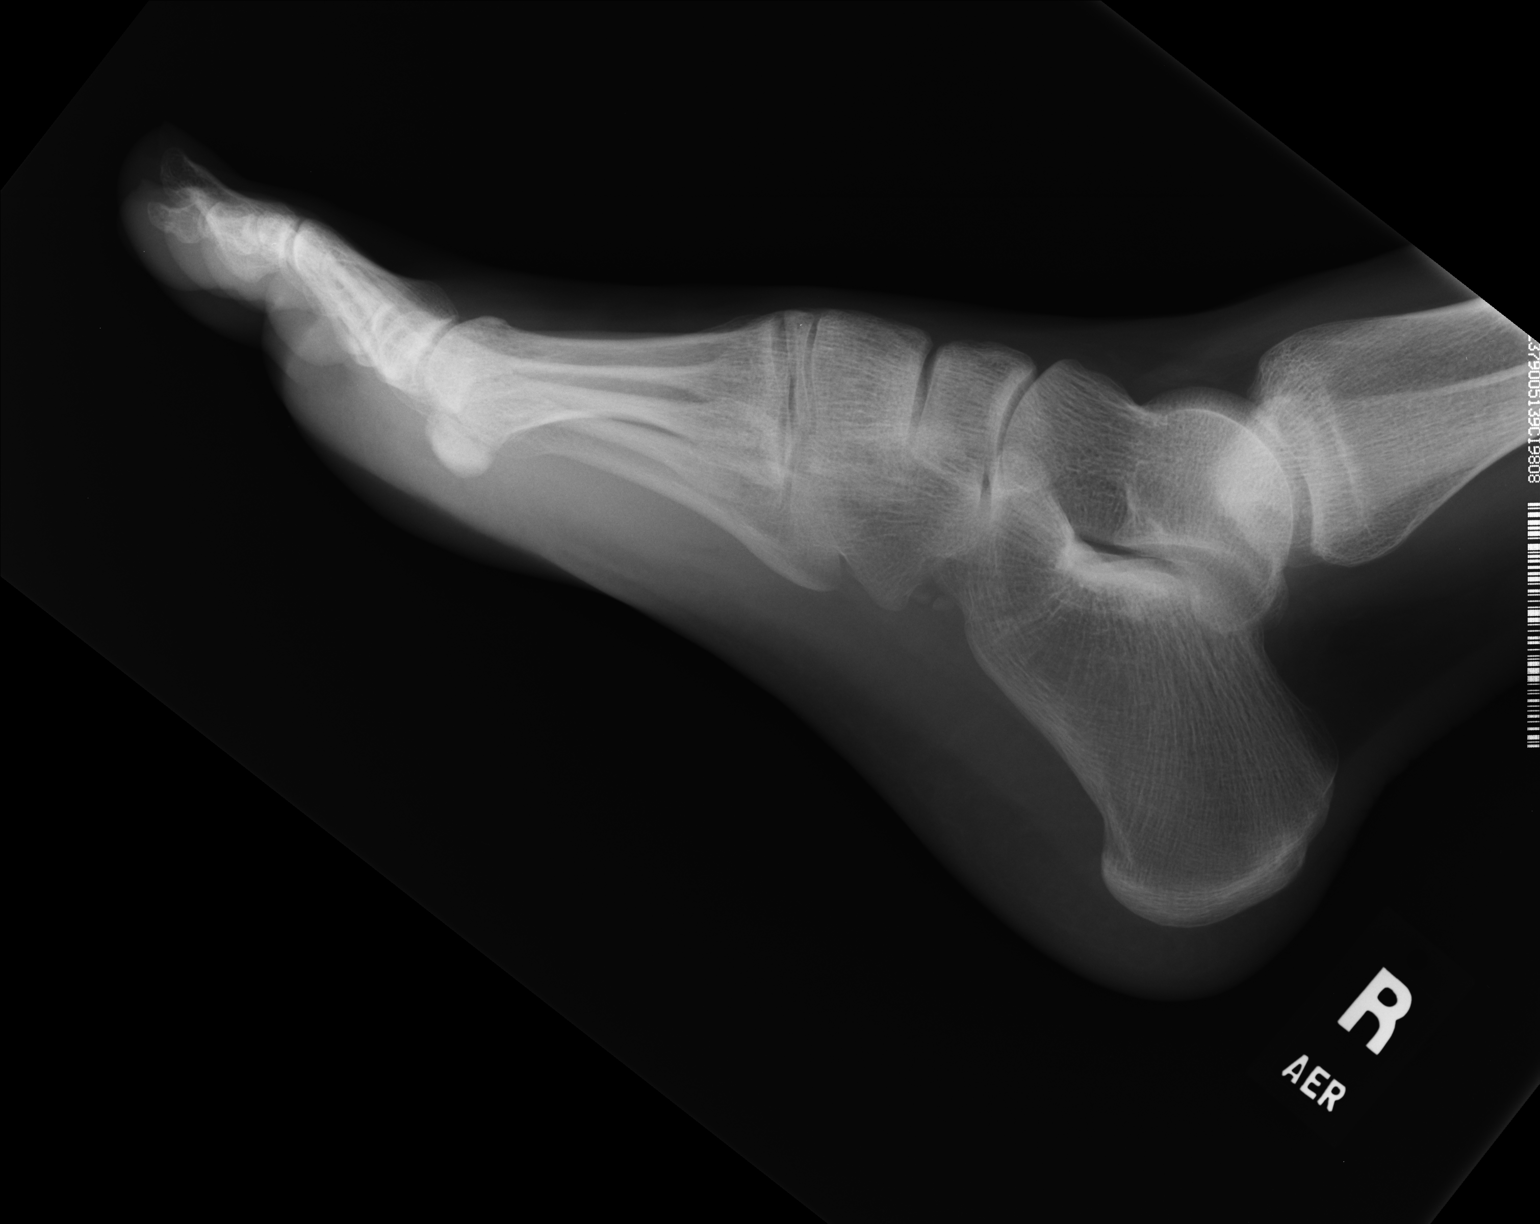

[3 of 3 positions shown; findings below may reference images not displayed]

FINDINGS: There is no evidence of fracture or dislocation. There is no
evidence of arthropathy or other focal bone abnormality. Soft
tissues are unremarkable.
IMPRESSION: Negative.

## 2013-08-16 MED ORDER — HYDROCODONE-ACETAMINOPHEN 10-325 MG PO TABS: 1.0000 | ORAL_TABLET | Freq: Three times a day (TID) | ORAL | Status: DC | PRN

## 2013-08-16 MED ORDER — DOXYCYCLINE HYCLATE 100 MG PO CAPS: 100.0000 mg | ORAL_CAPSULE | Freq: Two times a day (BID) | ORAL | Status: DC

## 2013-08-16 NOTE — Patient Instructions (Signed)
Call if you are not getting better.

## 2013-08-16 NOTE — Progress Notes (Signed)
[facility] [address], [place] [address] [phone]  Date:  08/16/2013   Name:  Ryan Spencer   DOB:  [birth year]   MRN:  [id number]  PCP:  Rogelia Boga, MD    Chief Complaint: Follow-up   History of Present Illness:  Ryan Spencer is a 27 y.o. very pleasant male patient who presents with the following:  Here today to follow-up cellulitis.  Seen initially on 10/12 after stepping on a rake and suffering a puncture wound to the sole of his right foot.  He was originally placed on cipro.  Returned on 10/15- added doxycycline.  His wound culture did grow out MRSA, sensitive to tetracyclines.  He has had MRSA in the past.  He reports that he does feel that he is improving. However, his foot continues to be painful to walk on.  He is able to walk but must reduce his pace  He reports that his tetanus shot is UTD No fever,  otherwise he feels well  Patient Active Problem List   Diagnosis Date Noted  . MRSA cellulitis 05/26/2013  . CARPAL TUNNEL SYNDROME, LEFT 05/07/2009  . RHUS DERMATITIS 04/11/2009  . TOBACCO ABUSE 11/19/2008  . URI 09/24/2008  . ARTHRALGIA 08/07/2008    Past Medical History  Diagnosis Date  . MRSA (methicillin resistant staph aureus) culture positive   . Abscess     History reviewed. No pertinent past surgical history.  History  Substance Use Topics  . Smoking status: Current Every Day Smoker -- 0.90 packs/day    Types: Cigarettes  . Smokeless tobacco: Never Used  . Alcohol Use: Yes     Comment: occasional    Family History  Problem Relation Age of Onset  . Arthritis Mother     Allergies  Allergen Reactions  . Tramadol Other (See Comments)    Headache    Medication list has been reviewed and updated.  Current Outpatient Prescriptions on File Prior to Visit  Medication Sig Dispense Refill  . ciprofloxacin (CIPRO) 500 MG tablet Take 1 tablet (500 mg total) by mouth 2 (two) times daily.  20 tablet  0   . doxycycline (VIBRAMYCIN) 100 MG capsule Take 1 capsule (100 mg total) by mouth 2 (two) times daily.  20 capsule  0  . HYDROcodone-acetaminophen (NORCO) 10-325 MG per tablet Take 1 tablet by mouth every 8 (eight) hours as needed for pain.  60 tablet  0  . metaxalone (SKELAXIN) 800 MG tablet Take 1 tablet (800 mg total) by mouth 3 (three) times daily.  30 tablet  1   No current facility-administered medications on file prior to visit.    Review of Systems:  As per HPI- otherwise negative.   Physical Examination: Filed Vitals:   08/16/13 0854  BP: 114/62  Pulse: 78  Temp: 97.7 F (36.5 C)  Resp: 18   Filed Vitals:   08/16/13 0854  Height: 5\' 11"  (1.803 m)  Weight: 145 lb (65.772 kg)   Body mass index is 20.23 kg/(m^2). Ideal Body Weight: Weight in (lb) to have BMI = 25: 178.9  GEN: WDWN, NAD, Non-toxic, A & O x 3, looks well HEENT: Atraumatic, Normocephalic. Neck supple. No masses, No LAD. Ears and Nose: No external deformity. CV: RRR, No M/G/R. No JVD. No thrill. No extra heart sounds. PULM: CTA B, no wheezes, crackles, rhonchi. No retractions. No resp. distress. No accessory muscle use. EXTR: No c/c/e NEURO Normal gait.  PSYCH: Normally interactive. Conversant.  Not depressed or anxious appearing.  Calm demeanor.  Right foot: there is a healing puncture wound at the distal portion of the 1st MT.  The surrounding area is mildly tender.  No swelling, redness or heat appreciated.    [facility] reading (PRIMARY) by  Dr. Patsy Lager. Right foot: negative for fracture, FB or overt evidence of osteomyelitis  CLINICAL DATA: Pain in foot. Recent puncture wound.  EXAM: RIGHT FOOT COMPLETE - 3+ VIEW  COMPARISON: None.  FINDINGS: There is no evidence of fracture or dislocation. There is no evidence of arthropathy or other focal bone abnormality. Soft tissues are unremarkable.  IMPRESSION: Negative.   Assessment and Plan: Cellulitis of right foot - Plan: DG Foot Complete Right,  doxycycline (VIBRAMYCIN) 100 MG capsule  Cellulitis of foot with MRSA culture.  At this time his foot looks better.  Will add another 10 days of doxycycline.  It is possible that he might develop osteomyelitis.  Discussed with him in detail.  I am glad to do an MRI for him now if he would like.  At this time he prefers to wait and see how he does.  However if he does not continue to improve he will call or come back, and we will consider an MRI to rule- out osteo.     Signed Abbe Amsterdam, MD  Called and [facility].  X-ray read as normal.  I saw in a past note that his tetanus shot was given in 2000; suspect this may be a typo.  However, if this is true asked him to please come back for a tetanus or give [place] a call because he is due for a booster.  However if his tetanus shot was within the last 10 years this is not necessary.

## 2013-08-16 NOTE — Telephone Encounter (Signed)
Spoke to pt told him Rx ready for pickup. Rx printed and signed, put at front desk.

## 2013-09-12 ENCOUNTER — Telehealth: Payer: Self-pay | Admitting: Internal Medicine

## 2013-09-12 NOTE — Telephone Encounter (Signed)
Please advise of okay to refill and if pt needs urine done?

## 2013-09-12 NOTE — Telephone Encounter (Signed)
Ok; no screen needed

## 2013-09-12 NOTE — Telephone Encounter (Signed)
Pt request refill of HYDROcodone-acetaminophen (NORCO) 10-325 MG per tablet And metaxalone (SKELAXIN) 800 MG tablet (prn) Pharm: [facility]/ [address] Pt would like a call at work when ready and ask for him

## 2013-09-13 MED ORDER — METAXALONE 800 MG PO TABS: 800.0000 mg | ORAL_TABLET | Freq: Three times a day (TID) | ORAL | Status: DC

## 2013-09-13 MED ORDER — HYDROCODONE-ACETAMINOPHEN 10-325 MG PO TABS: 1.0000 | ORAL_TABLET | Freq: Three times a day (TID) | ORAL | Status: DC | PRN

## 2013-09-13 NOTE — Telephone Encounter (Signed)
Pt notified Rx's ready for pickup. Rx's printed and signed, put at front desk for pickup.

## 2013-10-02 ENCOUNTER — Telehealth: Payer: Self-pay | Admitting: Internal Medicine

## 2013-10-02 NOTE — Telephone Encounter (Signed)
Pt request refill of HYDROcodone-acetaminophen (NORCO) 10-325 MG per tablet ° °

## 2013-10-02 NOTE — Telephone Encounter (Signed)
Please advise refill was filled on 11/19 60 tablets.

## 2013-10-03 MED ORDER — HYDROCODONE-ACETAMINOPHEN 10-325 MG PO TABS: 1.0000 | ORAL_TABLET | Freq: Three times a day (TID) | ORAL | Status: DC | PRN

## 2013-10-03 NOTE — Telephone Encounter (Signed)
Spoke to pt told him Rx ready for pickup, will be at the front desk. Pt verbalized understanding. Rx printed and signed, put at front desk.

## 2013-10-03 NOTE — Telephone Encounter (Signed)
ok 

## 2013-10-11 ENCOUNTER — Emergency Department (INDEPENDENT_AMBULATORY_CARE_PROVIDER_SITE_OTHER)
Admission: EM | Admit: 2013-10-11 | Discharge: 2013-10-11 | Disposition: A | Discharge status: Home / Self Care | Payer: Managed Care, Other (non HMO) | Source: Home / Self Care | Attending: Family Medicine | Admitting: Family Medicine

## 2013-10-11 ENCOUNTER — Encounter (HOSPITAL_COMMUNITY): Payer: Self-pay | Admitting: Emergency Medicine

## 2013-10-11 DIAGNOSIS — J069 Acute upper respiratory infection, unspecified: Secondary | ICD-10-CM

## 2013-10-11 LAB — POCT RAPID STREP A: Streptococcus, Group A Screen (Direct): NEGATIVE

## 2013-10-11 MED ORDER — BENZONATATE 100 MG PO CAPS: 100.0000 mg | ORAL_CAPSULE | Freq: Three times a day (TID) | ORAL | Status: DC | PRN

## 2013-10-11 NOTE — ED Provider Notes (Signed)
CSN: [id number]     Arrival date & time 10/11/13  1123 History   First MD Initiated Contact with Patient 10/11/13 1336     Chief Complaint  Patient presents with  . URI   (Consider location/radiation/quality/duration/timing/severity/associated sxs/prior Treatment) Patient is a 27 y.o. male presenting with URI. The history is provided by the patient.  URI Presenting symptoms: congestion, cough, fatigue, rhinorrhea and sore throat   Presenting symptoms: no fever   Severity:  Moderate Onset quality:  Gradual Duration:  1 week Progression:  Unchanged Chronicity:  New Risk factors: sick contacts   Risk factors comment:  Son was ill with same one week ago   Past Medical History  Diagnosis Date  . MRSA (methicillin resistant staph aureus) culture positive   . Abscess    History reviewed. No pertinent past surgical history. Family History  Problem Relation Age of Onset  . Arthritis Mother    History  Substance Use Topics  . Smoking status: Current Every Day Smoker -- 0.90 packs/day    Types: Cigarettes  . Smokeless tobacco: Never Used  . Alcohol Use: Yes     Comment: occasional    Review of Systems  Constitutional: Positive for fatigue. Negative for fever.  HENT: Positive for congestion, rhinorrhea and sore throat.   Respiratory: Positive for cough.   All other systems reviewed and are negative.    Allergies  Tramadol  Home Medications   Current Outpatient Rx  Name  Route  Sig  Dispense  Refill  . ciprofloxacin (CIPRO) 500 MG tablet   Oral   Take 1 tablet (500 mg total) by mouth 2 (two) times daily.   20 tablet   0   . doxycycline (VIBRAMYCIN) 100 MG capsule   Oral   Take 1 capsule (100 mg total) by mouth 2 (two) times daily.   20 capsule   0   . HYDROcodone-acetaminophen (NORCO) 10-325 MG per tablet   Oral   Take 1 tablet by mouth every 8 (eight) hours as needed.   60 tablet   0   . metaxalone (SKELAXIN) 800 MG tablet   Oral   Take 1 tablet (800 mg  total) by mouth 3 (three) times daily.   30 tablet   1    BP 113/66  Pulse 78  Temp(Src) 98.1 F (36.7 C) (Oral)  Resp 18  SpO2 96% Physical Exam  Constitutional: He is oriented to person, place, and time. He appears well-developed and well-nourished. No distress.  HENT:  Head: Normocephalic and atraumatic.  Right Ear: Hearing, tympanic membrane, external ear and ear canal normal.  Left Ear: Hearing, tympanic membrane, external ear and ear canal normal.  Nose: Nose normal.  Mouth/Throat: Oropharynx is clear and moist and mucous membranes are normal.  Eyes: Conjunctivae are normal. Pupils are equal, round, and reactive to light. Right eye exhibits no discharge. Left eye exhibits no discharge. No scleral icterus.  Neck: Normal range of motion. Neck supple.  Cardiovascular: Normal rate, regular rhythm and normal heart sounds.   Pulmonary/Chest: Effort normal and breath sounds normal.  Abdominal: Soft. Bowel sounds are normal. There is no tenderness.  Musculoskeletal: Normal range of motion.  Lymphadenopathy:    He has no cervical adenopathy.  Neurological: He is alert and oriented to person, place, and time.  Skin: Skin is warm and dry. No pallor.  Psychiatric: He has a normal mood and affect. His behavior is normal.    ED Course  Procedures (including critical care time) Labs  Review Labs Reviewed - No data to display Imaging Review No results found.  EKG Interpretation    Date/Time:    Ventricular Rate:    PR Interval:    QRS Duration:   QT Interval:    QTC Calculation:   R Axis:     Text Interpretation:              MDM   Rapid strep negative. Advised regarding symptomatic care at home with PCP follow up if no improvement.    Jess Barters [place], [place] 10/11/13 [phone]

## 2013-10-11 NOTE — ED Notes (Signed)
Patient is having cold symptoms, which include sore throat, headache, congestion, coughing with some mucous for 2 weeks. Stated he has taken OTC medications with no relief. States has some chest discomfort with coughing. Pain is 4/10. Written by: Marga Melnick

## 2013-10-12 NOTE — ED Provider Notes (Signed)
Medical screening examination/treatment/procedure(s) were performed by resident physician or non-physician practitioner and as supervising physician I was immediately available for consultation/collaboration.   Kaheem Halleck DOUGLAS MD.   Edison Wollschlager D Althia Egolf, MD 10/12/13 1740 

## 2013-10-13 LAB — CULTURE, GROUP A STREP

## 2013-10-21 ENCOUNTER — Ambulatory Visit: Payer: Managed Care, Other (non HMO)

## 2013-10-21 ENCOUNTER — Encounter: Payer: Self-pay | Admitting: Family Medicine

## 2013-10-21 ENCOUNTER — Ambulatory Visit (INDEPENDENT_AMBULATORY_CARE_PROVIDER_SITE_OTHER): Payer: Managed Care, Other (non HMO) | Admitting: Family Medicine

## 2013-10-21 VITALS — BP 100/70 | HR 71 | Temp 98.6°F | Resp 18 | Ht 71.0 in | Wt 150.6 lb

## 2013-10-21 DIAGNOSIS — R0781 Pleurodynia: Secondary | ICD-10-CM

## 2013-10-21 DIAGNOSIS — R05 Cough: Secondary | ICD-10-CM

## 2013-10-21 DIAGNOSIS — R079 Chest pain, unspecified: Secondary | ICD-10-CM

## 2013-10-21 DIAGNOSIS — J209 Acute bronchitis, unspecified: Secondary | ICD-10-CM

## 2013-10-21 DIAGNOSIS — R091 Pleurisy: Secondary | ICD-10-CM

## 2013-10-21 LAB — POCT CBC
Granulocyte percent: 61.8 %G (ref 37–80)
HCT, POC: 40.5 % — AB (ref 43.5–53.7)
Lymph, poc: 2.6 (ref 0.6–3.4)
MCH, POC: 30.5 pg (ref 27–31.2)
MCV: 98.1 fL — AB (ref 80–97)
MID (cbc): 0.6 (ref 0–0.9)
Platelet Count, POC: 216 10*3/uL (ref 142–424)
RBC: 4.13 M/uL — AB (ref 4.69–6.13)
WBC: 8.3 10*3/uL (ref 4.6–10.2)

## 2013-10-21 IMAGING — CR DG RIBS W/ CHEST 3+V*L*
3 series · 3 of 3 positions shown · non-contrast
Comparison: None.

CLINICAL DATA: Cough.  Left rib pain.

EXAM:
LEFT RIBS AND CHEST - 3+ VIEW

[PA]
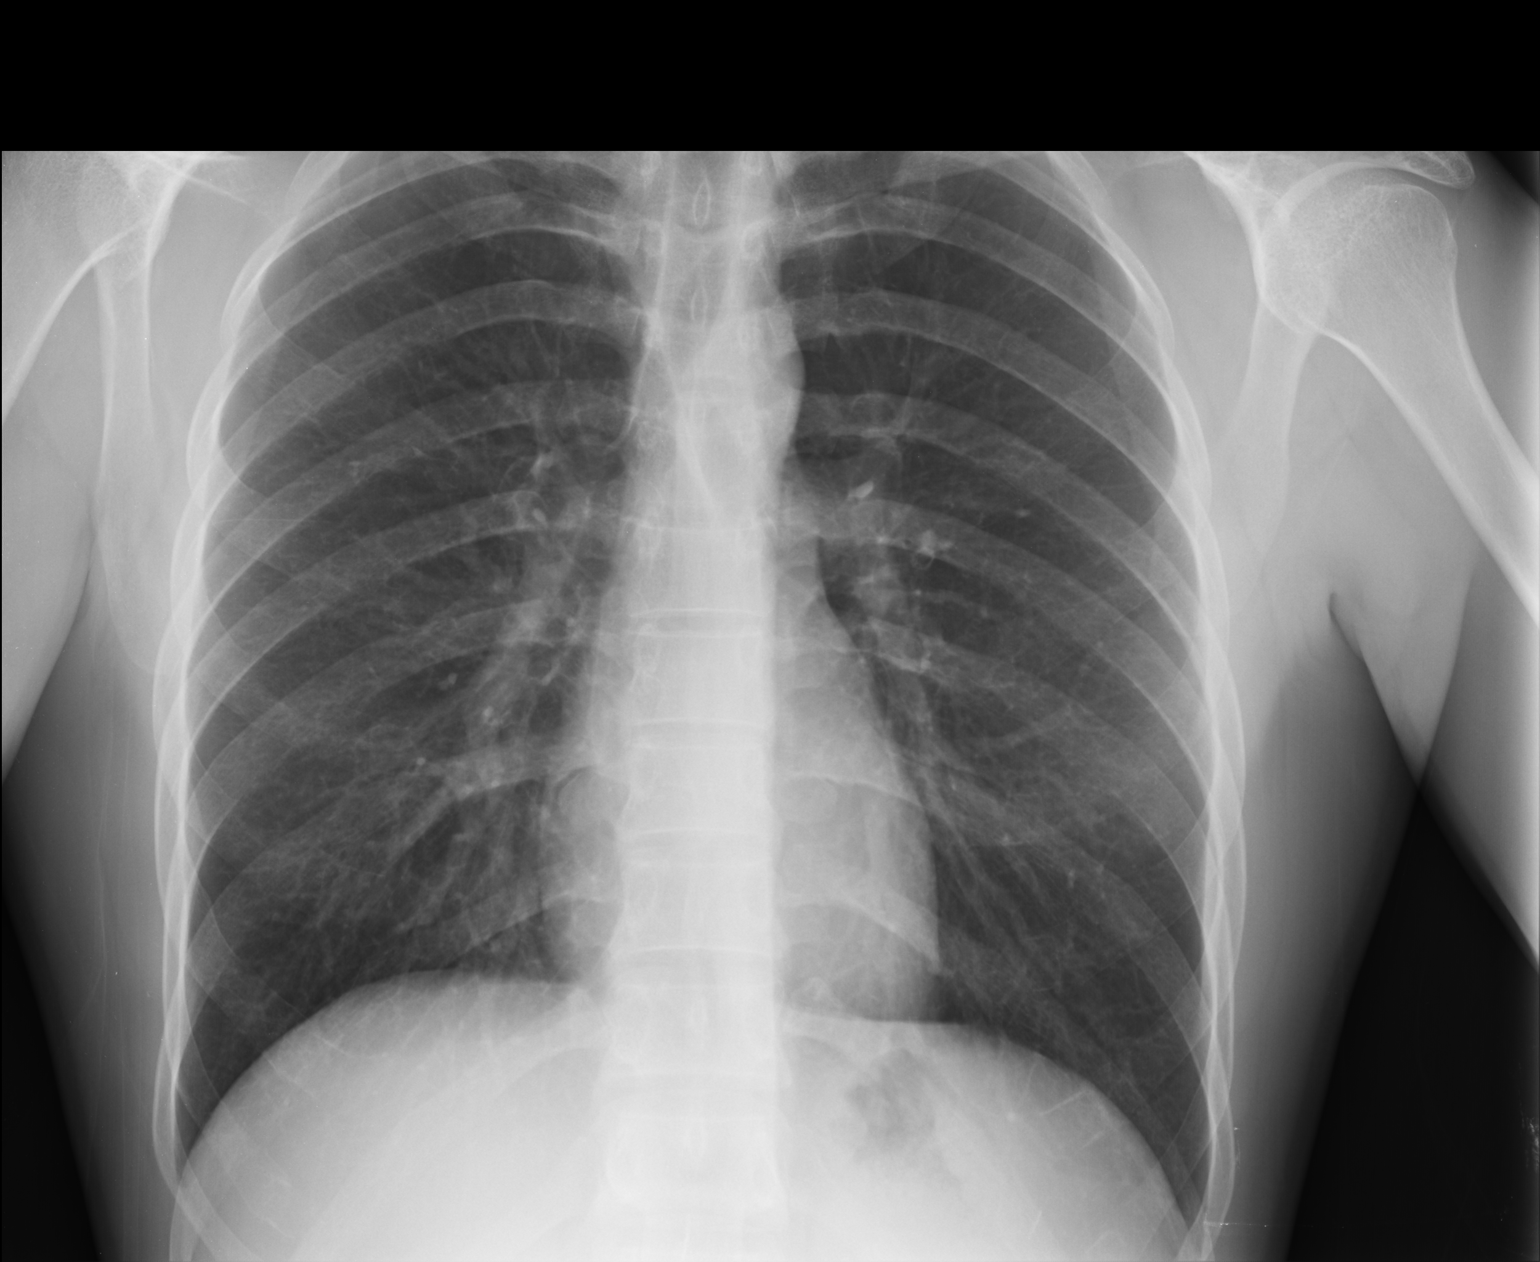

[lao (1 of 2)]
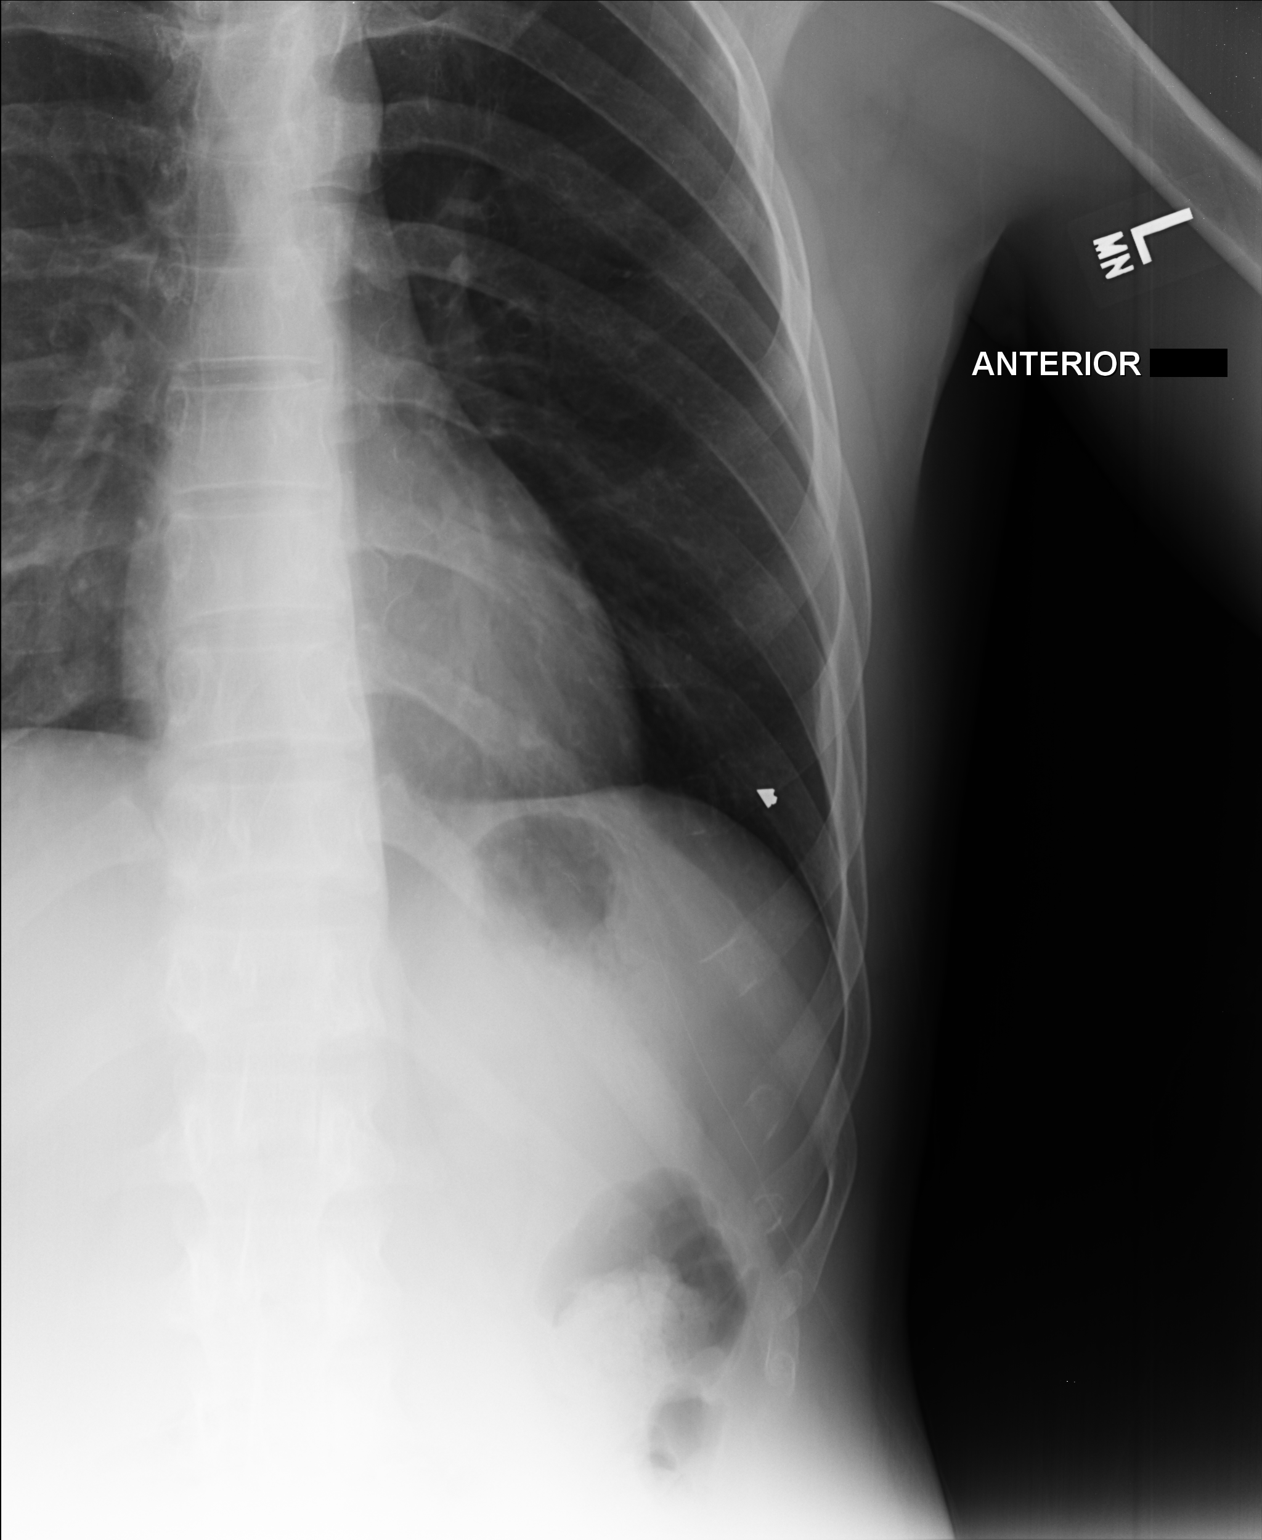

[lao (2 of 2)]
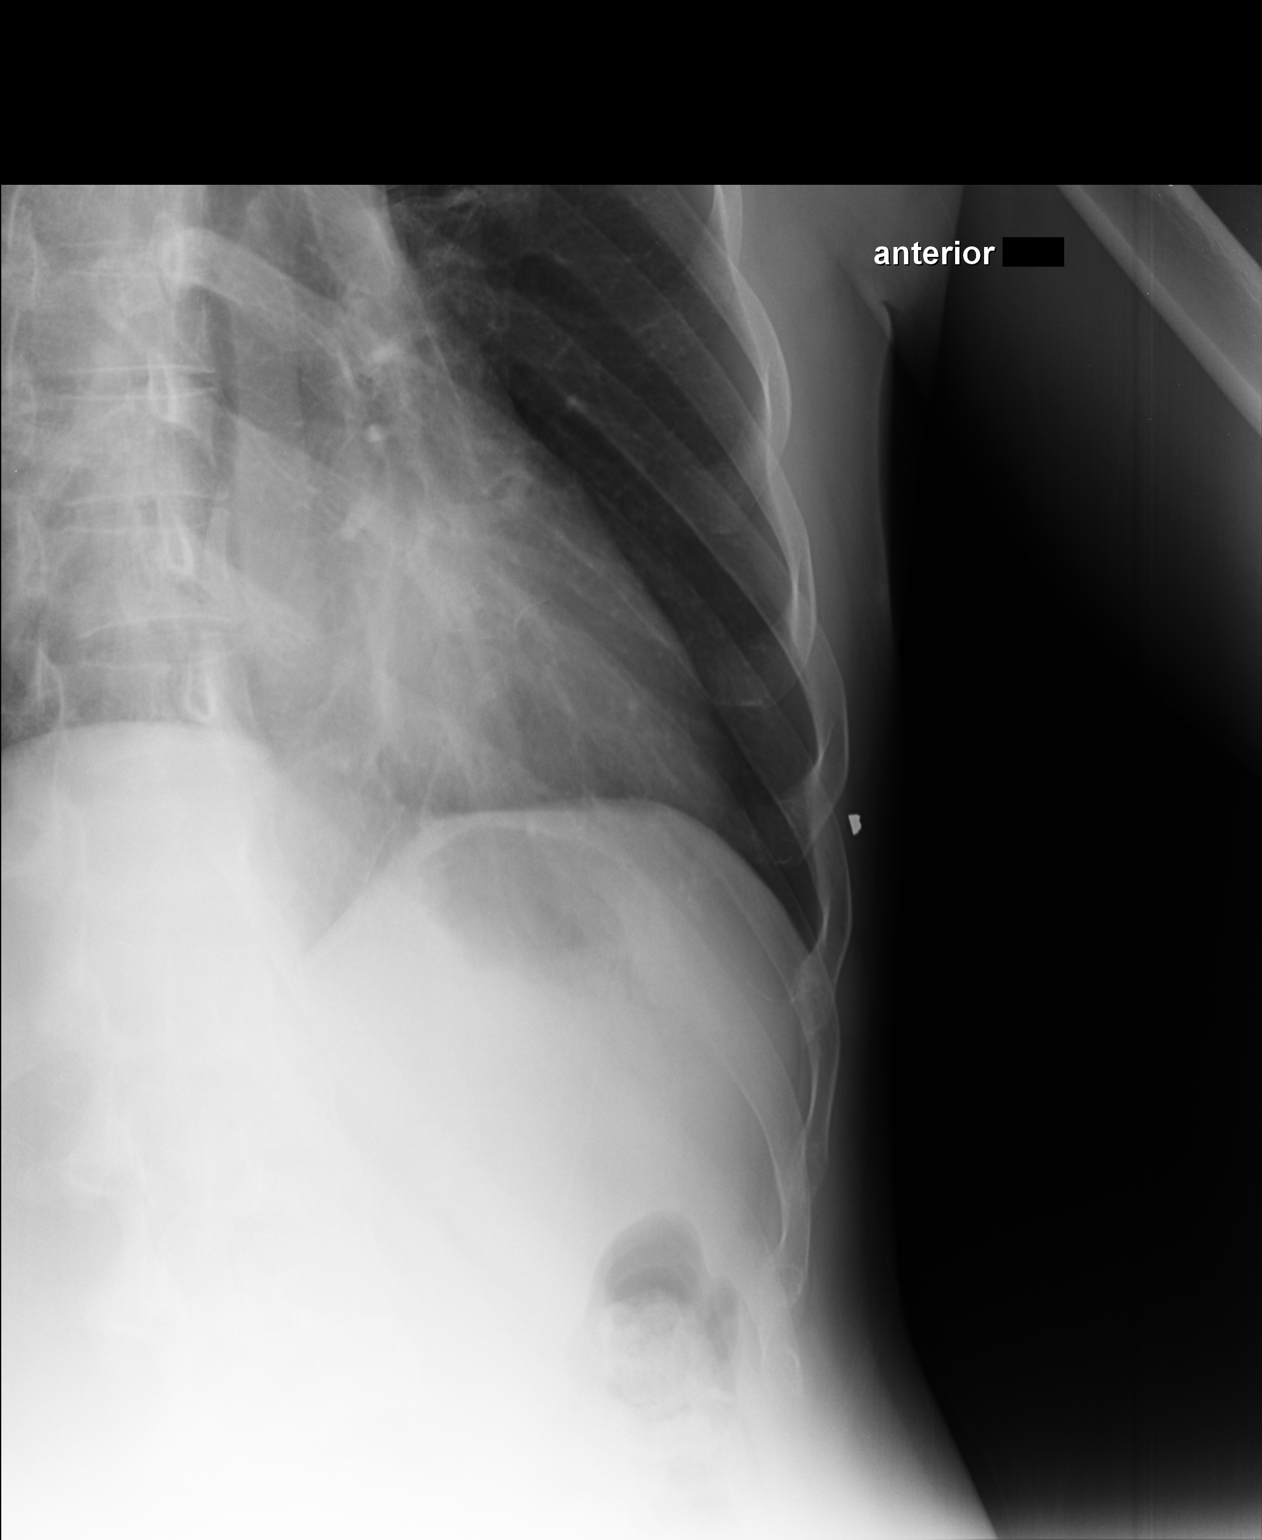

[3 of 3 positions shown; findings below may reference images not displayed]

FINDINGS: Lungs are clear. No pneumothorax or pleural effusion. Heart size is
normal. No rib fracture is identified.
IMPRESSION: Negative exam.

## 2013-10-21 MED ORDER — PROMETHAZINE-DM 6.25-15 MG/5ML PO SYRP: 5.0000 mL | ORAL_SOLUTION | Freq: Four times a day (QID) | ORAL | Status: DC | PRN

## 2013-10-21 MED ORDER — PREDNISONE 20 MG PO TABS: ORAL_TABLET | ORAL | Status: DC

## 2013-10-21 MED ORDER — BENZONATATE 200 MG PO CAPS: 200.0000 mg | ORAL_CAPSULE | Freq: Three times a day (TID) | ORAL | Status: DC | PRN

## 2013-10-21 NOTE — Progress Notes (Deleted)
This chart was scribed for Ryan Sorenson, MD by Luisa Dago, ED Scribe. This patient was seen in room 5 and the patient's care was started at 10:35 AM.  Subjective:    Patient ID: Ryan Spencer, male    DOB: Apr 02, 1986, 27 y.o.   MRN: [id number] Chief Complaint  Patient presents with   Cough   Chest Pain    rib pain left sided   HPI HPI Comments: Ryan Spencer is a 27 y.o. male who presents to [facility] complaining of worsening, constant left sided rib pain. Pt was was diagnosed with a URI 10 day ago at [facility]. However pt was negative for strep. He states that his rib pain is worsened by deep breathing and coughing but it has now become continuous pain even rest although worse w/ movement as well, is tender to even light palpation. Pt denies any leg swelling, other chest pain, or palpitations.  His wife has laryngitis - she was prescribed a hydrocodone syrup with the dr suggested he try as well - it didn't help.  The tessalon pearles he was prescribed at [facility] didn't help either.  His URI sxs have all resolved over the past wk but for the past 3d he has continued to have the cough - severely painful and occ productive of mucous.  He gets sweats when he coughs due to pain, but no f/c.   Past Medical History  Diagnosis Date   MRSA (methicillin resistant staph aureus) culture positive    Abscess    Current Outpatient Prescriptions on File Prior to Visit  Medication Sig Dispense Refill   benzonatate (TESSALON) 100 MG capsule Take 1 capsule (100 mg total) by mouth 3 (three) times daily as needed for cough.  21 capsule  0   HYDROcodone-acetaminophen (NORCO) 10-325 MG per tablet Take 1 tablet by mouth every 8 (eight) hours as needed.  60 tablet  0   metaxalone (SKELAXIN) 800 MG tablet Take 1 tablet (800 mg total) by mouth 3 (three) times daily.  30 tablet  1   ciprofloxacin (CIPRO) 500 MG tablet Take 1 tablet (500 mg total) by mouth 2 (two) times daily.  20  tablet  0   doxycycline (VIBRAMYCIN) 100 MG capsule Take 1 capsule (100 mg total) by mouth 2 (two) times daily.  20 capsule  0   No current facility-administered medications on file prior to visit.   Allergies  Allergen Reactions   Tramadol Other (See Comments)    Headache    Review of Systems  Constitutional: Negative for fever, chills and appetite change.  HENT: Negative for congestion, postnasal drip, rhinorrhea and sinus pressure.   Cardiovascular: Positive for chest pain (left rib pain). Negative for palpitations and leg swelling.  Gastrointestinal: Negative for nausea, vomiting, abdominal pain, diarrhea and constipation.  Musculoskeletal: Positive for arthralgias. Negative for joint swelling and myalgias.  Neurological: Positive for headaches. Negative for dizziness, syncope and light-headedness.  Psychiatric/Behavioral: Positive for sleep disturbance.      Vital Sings: BP 100/70   Pulse 71   Temp(Src) 98.6 F (37 C) (Oral)   Resp 18   Ht 5\' 11"  (1.803 m)   Wt 150 lb 9.6 oz (68.312 kg)   BMI 21.01 kg/m2   SpO2 98% Objective:   Physical Exam  Nursing note and vitals reviewed. Constitutional: He is oriented to person, place, and time. He appears well-developed and well-nourished.  HENT:  Head: Normocephalic and atraumatic.  Right Ear: Tympanic membrane,  external ear and ear canal normal.  Left Ear: Tympanic membrane, external ear and ear canal normal.  Right nasal edema and rhinorrhea. Oropharynx is normal.  Cardiovascular: Normal rate, regular rhythm and normal heart sounds.   No murmur heard. Pulmonary/Chest: Effort normal and breath sounds normal. No respiratory distress. He has no wheezes. He has no rales. Chest wall is not dull to percussion. He exhibits tenderness and bony tenderness. He exhibits no mass, no crepitus, no edema, no deformity and no retraction.  Severe tenderness over lower left lateral ribs - hyperesthetic w/ light palpation.  Abdominal: He exhibits no  distension.  Neurological: He is alert and oriented to person, place, and time.  Skin: Skin is warm and dry.  Psychiatric: He has a normal mood and affect.    Results for orders placed in visit on 10/21/13  POCT CBC      Result Value Range   WBC 8.3  4.6 - 10.2 K/uL   Lymph, poc 2.6  0.6 - 3.4   POC LYMPH PERCENT 31.3  10 - 50 %L   MID (cbc) 0.6  0 - 0.9   POC MID % 6.9  0 - 12 %M   POC Granulocyte 5.1  2 - 6.9   Granulocyte percent 61.8  37 - 80 %G   RBC 4.13 (*) 4.69 - 6.13 M/uL   Hemoglobin 12.6 (*) 14.1 - 18.1 g/dL   HCT, POC 40.9 (*) 81.1 - 53.7 %   MCV 98.1 (*) 80 - 97 fL   MCH, POC 30.5  27 - 31.2 pg   MCHC 31.1 (*) 31.8 - 35.4 g/dL   RDW, POC 91.4     Platelet Count, POC 216  142 - 424 K/uL   MPV 8.4  0 - 99.8 fL   Primary X-Ray reading by Dr. Clelia Croft. No acute abnormality. No lung filled infiltrates. No rib fractures seen.    EXAM: LEFT RIBS AND CHEST - 3+ VIEW  COMPARISON: None.  FINDINGS: Lungs are clear. No pneumothorax or pleural effusion. Heart size is normal. No rib fracture is identified.  IMPRESSION: Negative exam.    Assessment & Plan:  Rib pain on left side - Plan: DG Ribs Unilateral W/Chest Left, POCT CBC  Cough - Plan: DG Ribs Unilateral W/Chest Left, POCT CBC  Pleurisy - discussed diagnosis of pleurisy after viral bronchitis - cbc reassuring and cxr nml so will try high taper of prednisone. Hydrocodone and tessalon pearles didn't seems to help his cough sig so will try the promethazine syrup. Reviewed immunosuppression w/ steroid potential so RTC if worsening as at risk of pna.  Acute bronchitis  Meds ordered this encounter  Medications   predniSONE (DELTASONE) 20 MG tablet    Sig: Take 3 tabs po qd x 3d, then 2 tabs po qd x 3d, then 1 tab po qd x 3d, then stop    Dispense:  18 tablet    Refill:  0   promethazine-dextromethorphan (PROMETHAZINE-DM) 6.25-15 MG/5ML syrup    Sig: Take 5 mLs by mouth 4 (four) times daily as needed for cough.      Dispense:  180 mL    Refill:  0   benzonatate (TESSALON) 200 MG capsule    Sig: Take 1 capsule (200 mg total) by mouth 3 (three) times daily as needed for cough.    Dispense:  21 capsule    Refill:  0    Order Specific Question:  Supervising Provider    Answer:  Bradd Canary  D [[id number]]    I personally performed the services described in this documentation, which was scribed in my presence. The recorded information has been reviewed and considered, and addended by me as needed.  Ryan Sorenson, MD MPH

## 2013-10-21 NOTE — Patient Instructions (Addendum)
Pleurisy Pleurisy is an inflammation and swelling of the lining of the lungs (pleura). Because of this inflammation, it hurts to breathe. It can be aggravated by coughing, laughing, or deep breathing. Pleurisy is often caused by an underlying infection or disease.  HOME CARE INSTRUCTIONS  Monitor your pleurisy for any changes. The following actions may help to alleviate any discomfort you are experiencing:  Medicine may help with pain. Only take over-the-counter or prescription medicines for pain, discomfort, or fever as directed by your health care provider.  Only take antibiotic medicine as directed. Make sure to finish it even if you start to feel better. SEEK MEDICAL CARE IF:   Your pain is not controlled with medicine or is increasing.  You have an increase in pus-like (purulent) secretions brought up with coughing. SEEK IMMEDIATE MEDICAL CARE IF:   You have blue or dark lips, fingernails, or toenails.  You are coughing up blood.  You have increased difficulty breathing.  You have continuing pain unrelieved by medicine or pain lasting more than 1 week.  You have pain that radiates into your neck, arms, or jaw.  You develop increased shortness of breath or wheezing.  You develop a fever, rash, vomiting, fainting, or other serious symptoms. MAKE SURE YOU:  Understand these instructions.   Will watch your condition.   Will get help right away if you are not doing well or get worse.  Document Released: 10/12/2005 Document Revised: 06/14/2013 Document Reviewed: 03/26/2013 [employer] Patient Information 2014 [employer]. Acute Bronchitis Bronchitis is inflammation of the airways that extend from the windpipe into the lungs (bronchi). The inflammation often causes mucus to develop. This leads to a cough, which is the most common symptom of bronchitis.  In acute bronchitis, the condition usually develops suddenly and goes away over time, usually in a couple weeks. Smoking,  allergies, and asthma can make bronchitis worse. Repeated episodes of bronchitis may cause further lung problems.  CAUSES Acute bronchitis is most often caused by the same virus that causes a cold. The virus can spread from person to person (contagious).  SIGNS AND SYMPTOMS   Cough.   Fever.   Coughing up mucus.   Body aches.   Chest congestion.   Chills.   Shortness of breath.   Sore throat.  DIAGNOSIS  Acute bronchitis is usually diagnosed through a physical exam. Tests, such as chest X-rays, are sometimes done to rule out other conditions.  TREATMENT  Acute bronchitis usually goes away in a couple weeks. Often times, no medical treatment is necessary. Medicines are sometimes given for relief of fever or cough. Antibiotics are usually not needed but may be prescribed in certain situations. In some cases, an inhaler may be recommended to help reduce shortness of breath and control the cough. A cool mist vaporizer may also be used to help thin bronchial secretions and make it easier to clear the chest.  HOME CARE INSTRUCTIONS  Get plenty of rest.   Drink enough fluids to keep your urine clear or pale yellow (unless you have a medical condition that requires fluid restriction). Increasing fluids may help thin your secretions and will prevent dehydration.   Only take over-the-counter or prescription medicines as directed by your health care provider.   Avoid smoking and secondhand smoke. Exposure to cigarette smoke or irritating chemicals will make bronchitis worse. If you are a smoker, consider using nicotine gum or skin patches to help control withdrawal symptoms. Quitting smoking will help your lungs heal faster.     Reduce the chances of another bout of acute bronchitis by washing your hands frequently, avoiding people with cold symptoms, and trying not to touch your hands to your mouth, nose, or eyes.   Follow up with your health care provider as directed.  SEEK  MEDICAL CARE IF: Your symptoms do not improve after 1 week of treatment.  SEEK IMMEDIATE MEDICAL CARE IF:  You develop an increased fever or chills.   You have chest pain.   You have severe shortness of breath.  You have bloody sputum.   You develop dehydration.  You develop fainting.  You develop repeated vomiting.  You develop a severe headache. MAKE SURE YOU:   Understand these instructions.  Will watch your condition.  Will get help right away if you are not doing well or get worse. Document Released: 11/19/2004 Document Revised: 06/14/2013 Document Reviewed: 04/04/2013 [facility] Patient Information 2014 [place], [place].

## 2013-10-23 ENCOUNTER — Telehealth: Payer: Self-pay | Admitting: Internal Medicine

## 2013-10-23 NOTE — Telephone Encounter (Signed)
Pt is requesting a refill of his HYDROcodone-acetaminophen (NORCO) 10-325 MG per tablet. Please call when ready for pick up.

## 2013-10-24 MED ORDER — HYDROCODONE-ACETAMINOPHEN 10-325 MG PO TABS: 1.0000 | ORAL_TABLET | Freq: Three times a day (TID) | ORAL | Status: DC | PRN

## 2013-10-24 NOTE — Telephone Encounter (Signed)
Left detailed message Rx ready for pickup will be at the front desk. Rx printed and signed. 

## 2013-10-25 NOTE — Progress Notes (Signed)
This chart was scribed for Norberto Sorenson, MD by Luisa Dago, ED Scribe. This patient was seen in room 5 and the patient's care was started at 10:35 AM.  Subjective:    Patient ID: Ryan Spencer, male    DOB: 02-05-1986, 27 y.o.   MRN: [id number] Chief Complaint  Patient presents with  . Cough  . Chest Pain    rib pain left sided   Cough Associated symptoms include chest pain and headaches. Pertinent negatives include no chills, fever, myalgias, postnasal drip or rhinorrhea.  Chest Pain  Associated symptoms include a cough and headaches. Pertinent negatives include no abdominal pain, dizziness, fever, nausea, palpitations or vomiting.   HPI Comments: Ryan Spencer is a 27 y.o. male who presents to [facility] complaining of worsening, constant left sided rib pain. Pt was was diagnosed with a URI 10 day ago at [facility]. However pt was negative for strep. He states that his rib pain is worsened by deep breathing and coughing but it has now become continuous pain even rest although worse w/ movement as well, is tender to even light palpation. Pt denies any leg swelling, other chest pain, or palpitations.  His wife has laryngitis - she was prescribed a hydrocodone syrup with the dr suggested he try as well - it didn't help.  The tessalon pearles he was prescribed at [facility] didn't help either.  His URI sxs have all resolved over the past wk but for the past 3d he has continued to have the cough - severely painful and occ productive of mucous.  He gets sweats when he coughs due to pain, but no f/c.   Past Medical History  Diagnosis Date  . MRSA (methicillin resistant staph aureus) culture positive   . Abscess    Current Outpatient Prescriptions on File Prior to Visit  Medication Sig Dispense Refill  . metaxalone (SKELAXIN) 800 MG tablet Take 1 tablet (800 mg total) by mouth 3 (three) times daily.  30 tablet  1  . ciprofloxacin (CIPRO) 500 MG tablet Take 1 tablet (500 mg  total) by mouth 2 (two) times daily.  20 tablet  0  . doxycycline (VIBRAMYCIN) 100 MG capsule Take 1 capsule (100 mg total) by mouth 2 (two) times daily.  20 capsule  0   No current facility-administered medications on file prior to visit.   Allergies  Allergen Reactions  . Tramadol Other (See Comments)    Headache    Review of Systems  Constitutional: Negative for fever, chills and appetite change.  HENT: Negative for congestion, postnasal drip, rhinorrhea and sinus pressure.   Respiratory: Positive for cough.   Cardiovascular: Positive for chest pain. Negative for palpitations and leg swelling.  Gastrointestinal: Negative for nausea, vomiting, abdominal pain, diarrhea and constipation.  Musculoskeletal: Positive for arthralgias. Negative for joint swelling and myalgias.  Neurological: Positive for headaches. Negative for dizziness, syncope and light-headedness.  Psychiatric/Behavioral: Positive for sleep disturbance.      Vital Sings: BP 100/70  Pulse 71  Temp(Src) 98.6 F (37 C) (Oral)  Resp 18  Ht 5\' 11"  (1.803 m)  Wt 150 lb 9.6 oz (68.312 kg)  BMI 21.01 kg/m2  SpO2 98% Objective:   Physical Exam  Nursing note and vitals reviewed. Constitutional: He is oriented to person, place, and time. He appears well-developed and well-nourished.  HENT:  Head: Normocephalic and atraumatic.  Right Ear: Tympanic membrane, external ear and ear canal normal.  Left Ear: Tympanic membrane, external ear  and ear canal normal.  Right nasal edema and rhinorrhea. Oropharynx is normal.  Cardiovascular: Normal rate, regular rhythm and normal heart sounds.   No murmur heard. Pulmonary/Chest: Effort normal and breath sounds normal. No respiratory distress. He has no wheezes. He has no rales. Chest wall is not dull to percussion. He exhibits tenderness and bony tenderness. He exhibits no mass, no crepitus, no edema, no deformity and no retraction.  Severe tenderness over lower left lateral ribs -  hyperesthetic w/ light palpation.  Abdominal: He exhibits no distension.  Neurological: He is alert and oriented to person, place, and time.  Skin: Skin is warm and dry.  Psychiatric: He has a normal mood and affect.    Results for orders placed in visit on 10/21/13  POCT CBC      Result Value Range   WBC 8.3  4.6 - 10.2 K/uL   Lymph, poc 2.6  0.6 - 3.4   POC LYMPH PERCENT 31.3  10 - 50 %L   MID (cbc) 0.6  0 - 0.9   POC MID % 6.9  0 - 12 %M   POC Granulocyte 5.1  2 - 6.9   Granulocyte percent 61.8  37 - 80 %G   RBC 4.13 (*) 4.69 - 6.13 M/uL   Hemoglobin 12.6 (*) 14.1 - 18.1 g/dL   HCT, POC 16.1 (*) 09.6 - 53.7 %   MCV 98.1 (*) 80 - 97 fL   MCH, POC 30.5  27 - 31.2 pg   MCHC 31.1 (*) 31.8 - 35.4 g/dL   RDW, POC 04.5     Platelet Count, POC 216  142 - 424 K/uL   MPV 8.4  0 - 99.8 fL   Primary X-Ray reading by Dr. Clelia Croft. No acute abnormality. No lung filled infiltrates. No rib fractures seen.    EXAM: LEFT RIBS AND CHEST - 3+ VIEW  COMPARISON: None.  FINDINGS: Lungs are clear. No pneumothorax or pleural effusion. Heart size is normal. No rib fracture is identified.  IMPRESSION: Negative exam.    Assessment & Plan:  Rib pain on left side - Plan: DG Ribs Unilateral W/Chest Left, POCT CBC  Cough - Plan: DG Ribs Unilateral W/Chest Left, POCT CBC  Pleurisy - discussed diagnosis of pleurisy after viral bronchitis - cbc reassuring and cxr nml so will try high taper of prednisone. Hydrocodone and tessalon pearles didn't seems to help his cough sig so will try the promethazine syrup. Reviewed immunosuppression w/ steroid potential so RTC if worsening as at risk of pna.  Acute bronchitis  Meds ordered this encounter  Medications  . predniSONE (DELTASONE) 20 MG tablet    Sig: Take 3 tabs po qd x 3d, then 2 tabs po qd x 3d, then 1 tab po qd x 3d, then stop    Dispense:  18 tablet    Refill:  0  . promethazine-dextromethorphan (PROMETHAZINE-DM) 6.25-15 MG/5ML syrup    Sig:  Take 5 mLs by mouth 4 (four) times daily as needed for cough.    Dispense:  180 mL    Refill:  0  . benzonatate (TESSALON) 200 MG capsule    Sig: Take 1 capsule (200 mg total) by mouth 3 (three) times daily as needed for cough.    Dispense:  21 capsule    Refill:  0    Order Specific Question:  Supervising Provider    Answer:  Linna Hoff [phone]    I personally performed the services described in this documentation,  which was scribed in my presence. The recorded information has been reviewed and considered, and addended by me as needed.  Norberto SorensonEva Dean Wonder, MD MPH

## 2013-11-13 ENCOUNTER — Telehealth: Payer: Self-pay | Admitting: Internal Medicine

## 2013-11-13 MED ORDER — HYDROCODONE-ACETAMINOPHEN 10-325 MG PO TABS: 1.0000 | ORAL_TABLET | Freq: Three times a day (TID) | ORAL | Status: DC | PRN

## 2013-11-13 NOTE — Telephone Encounter (Signed)
ok 

## 2013-11-13 NOTE — Telephone Encounter (Signed)
Pt requesting refill on Hydrocodone last filled 12/30, #60 tablets. Please advise if refill okay?

## 2013-11-13 NOTE — Telephone Encounter (Signed)
Pt notified Rx ready for pickup. Rx printed and signed, put at front desk. 

## 2013-11-13 NOTE — Telephone Encounter (Signed)
Pt requesting refill of HYDROcodone-acetaminophen (NORCO) 10-325 MG per tablet °

## 2013-11-24 ENCOUNTER — Encounter: Payer: Self-pay | Admitting: Family Medicine

## 2013-11-24 ENCOUNTER — Ambulatory Visit (INDEPENDENT_AMBULATORY_CARE_PROVIDER_SITE_OTHER): Payer: BC Managed Care – PPO | Admitting: Family Medicine

## 2013-11-24 VITALS — BP 110/64 | HR 66 | Temp 98.0°F | Wt 152.0 lb

## 2013-11-24 DIAGNOSIS — L02818 Cutaneous abscess of other sites: Secondary | ICD-10-CM

## 2013-11-24 DIAGNOSIS — L03818 Cellulitis of other sites: Secondary | ICD-10-CM

## 2013-11-24 DIAGNOSIS — L02811 Cutaneous abscess of head [any part, except face]: Secondary | ICD-10-CM

## 2013-11-24 MED ORDER — DOXYCYCLINE HYCLATE 100 MG PO CAPS: 100.0000 mg | ORAL_CAPSULE | Freq: Two times a day (BID) | ORAL | Status: DC

## 2013-11-24 NOTE — Patient Instructions (Signed)
Use warm compresses to head several times daily If scalp becomes more fluctuant or pain and swelling not receeding next few days follow up.

## 2013-11-24 NOTE — Progress Notes (Signed)
   Subjective:    Patient ID: Ryan Spencer, male    DOB: 09/29/1986, 28 y.o.   MRN: [id number]  HPI 2 painful areas posterior scalp occipital area Past history of recurrent MRSA. Onset a few days ago so some soreness. But today had some redness which had not been present yesterday. Increasing swelling. No fevers or chills. Has previously taken doxycycline without difficulty.  Past Medical History  Diagnosis Date  . MRSA (methicillin resistant staph aureus) culture positive   . Abscess    No past surgical history on file.  reports that he has been smoking Cigarettes.  He has been smoking about 0.90 packs per day. He has never used smokeless tobacco. He reports that he drinks alcohol. He reports that he does not use illicit drugs. family history includes Arthritis in his mother. Allergies  Allergen Reactions  . Tramadol Other (See Comments)    Headache      Review of Systems  Constitutional: Negative for fever and chills.       Objective:   Physical Exam  Constitutional: He appears well-developed and well-nourished.  Cardiovascular: Normal rate.   Pulmonary/Chest: Effort normal and breath sounds normal. No respiratory distress. He has no wheezes. He has no rales.  Skin:  Posterior scalp reveals area of erythema ,induration, warmth and tenderness about 1 and 1/2 cm diameter. Just to the left of this he has a smaller similar area about 1/2 cm diameter. No fluctuance. Larger area to the right side has slightly pustular Center.          Assessment & Plan:  Recurrent abscess scalp. Nonfluctuant. History of recurrent MRSA. Start doxycycline 100 mg twice daily for 10 days. Warm compresses. May need I&D if this becomes more fluctuant. Followup on Monday if not resolving

## 2013-11-24 NOTE — Progress Notes (Signed)
Pre visit review using our clinic review tool, if applicable. No additional management support is needed unless otherwise documented below in the visit note. 

## 2013-11-29 ENCOUNTER — Telehealth: Payer: Self-pay | Admitting: Internal Medicine

## 2013-11-29 NOTE — Telephone Encounter (Signed)
Relevant patient education assigned to patient using Emmi. ° °

## 2013-12-01 ENCOUNTER — Telehealth: Payer: Self-pay | Admitting: Internal Medicine

## 2013-12-01 MED ORDER — METAXALONE 800 MG PO TABS: 800.0000 mg | ORAL_TABLET | Freq: Three times a day (TID) | ORAL | Status: DC

## 2013-12-01 NOTE — Telephone Encounter (Signed)
Pt is requesting a refill of his metaxalone (SKELAXIN) 800 MG tablet and HYDROcodone-acetaminophen (NORCO) 10-325 MG per tablet. Please call when ready for pick up.

## 2013-12-01 NOTE — Telephone Encounter (Signed)
Left detailed message one Rx sent to pharmacy other Rx will be ready on Monday for pickup.

## 2013-12-04 MED ORDER — HYDROCODONE-ACETAMINOPHEN 10-325 MG PO TABS: 1.0000 | ORAL_TABLET | Freq: Three times a day (TID) | ORAL | Status: DC | PRN

## 2013-12-04 NOTE — Telephone Encounter (Signed)
Pt notified Rx ready for pickup. Rx printed and signed, put at front desk. 

## 2013-12-04 NOTE — Telephone Encounter (Signed)
Spoke with patient to notify him that his hydrocodone refill is ready for pick up.

## 2013-12-22 ENCOUNTER — Telehealth: Payer: Self-pay | Admitting: Internal Medicine

## 2013-12-22 NOTE — Telephone Encounter (Signed)
Pt needs new rx hydrocodone °

## 2013-12-25 MED ORDER — HYDROCODONE-ACETAMINOPHEN 10-325 MG PO TABS: 1.0000 | ORAL_TABLET | Freq: Three times a day (TID) | ORAL | Status: DC | PRN

## 2013-12-25 NOTE — Telephone Encounter (Signed)
Spoke to pt told him Rx ready for pickup, will be at the front desk. Pt verbalized understanding. Rx printed and signed. 

## 2014-01-10 ENCOUNTER — Telehealth: Payer: Self-pay | Admitting: Internal Medicine

## 2014-01-10 NOTE — Telephone Encounter (Signed)
Pt states if he comes and does the urine screen, can he pu a 30 day rx instaes of the 20 day he has been getting? HYDROcodone-acetaminophen (NORCO) 10-325 MG per tablet Pt would like to know if he needs to make an appt? It has been a while since pt has been in. pls advise

## 2014-01-11 MED ORDER — HYDROCODONE-ACETAMINOPHEN 10-325 MG PO TABS: 1.0000 | ORAL_TABLET | Freq: Three times a day (TID) | ORAL | Status: DC | PRN

## 2014-01-11 NOTE — Telephone Encounter (Signed)
Left detailed message pt needs to schedule appointment next week with Dr. Kirtland BouchardK and go to lab for UDS and sign contract. Rx printed and signed put at front desk.

## 2014-01-11 NOTE — Telephone Encounter (Signed)
Dr. Kirtland BouchardK, please advise on refill? Last refill for Hydrocodone 10-325 mg , # 60 was on 3/2. Pt needs to sign controlled substance and do UDS,  none done. Pt requesting more than 60 tablets. Please advise

## 2014-01-11 NOTE — Telephone Encounter (Signed)
Okay to refill #60 Schedule return  office visit next week to discuss analgesics and possible referral

## 2014-01-16 ENCOUNTER — Ambulatory Visit: Payer: BC Managed Care – PPO | Admitting: Internal Medicine

## 2014-01-16 DIAGNOSIS — Z0289 Encounter for other administrative examinations: Secondary | ICD-10-CM

## 2014-03-26 ENCOUNTER — Ambulatory Visit (INDEPENDENT_AMBULATORY_CARE_PROVIDER_SITE_OTHER): Payer: BC Managed Care – PPO | Admitting: Surgery

## 2014-03-26 ENCOUNTER — Encounter (INDEPENDENT_AMBULATORY_CARE_PROVIDER_SITE_OTHER): Payer: Self-pay | Admitting: Surgery

## 2014-03-26 VITALS — BP 132/64 | HR 68 | Temp 98.6°F | Resp 14 | Ht 70.5 in | Wt 148.2 lb

## 2014-03-26 DIAGNOSIS — K4020 Bilateral inguinal hernia, without obstruction or gangrene, not specified as recurrent: Secondary | ICD-10-CM

## 2014-03-26 MED ORDER — HYDROCODONE-ACETAMINOPHEN 10-325 MG PO TABS: 1.0000 | ORAL_TABLET | Freq: Three times a day (TID) | ORAL | Status: DC | PRN

## 2014-03-26 NOTE — Progress Notes (Signed)
Patient ID: Ryan Spencer, male   DOB: 09/01/1986, 28 y.o.   MRN: [id number]  Chief Complaint  Patient presents with  . Inguinal Hernia    new pt- eval ing hernia, possible bilateral    HPI Ryan Spencer is a 28 y.o. male.  Pt sent at the request of  [name], MD for right groin pain times 1 month.  Pain worse with lifting or straining.  Pain radiates to left testicle .  Sharp at times but has aching sensation at end of the day.  Mild right groin discomfort. No dysuria.   HPI  Past Medical History  Diagnosis Date  . MRSA (methicillin resistant staph aureus) culture positive   . Abscess   . Arthritis     History reviewed. No pertinent past surgical history.  Family History  Problem Relation Age of Onset  . Arthritis Mother     Social History History  Substance Use Topics  . Smoking status: Current Every Day Smoker -- 1.00 packs/day    Types: Cigarettes  . Smokeless tobacco: Never Used  . Alcohol Use: Yes     Comment: occasional    Allergies  Allergen Reactions  . Tramadol Other (See Comments)    Headache    Current Outpatient Prescriptions  Medication Sig Dispense Refill  . HYDROcodone-acetaminophen (NORCO) 10-325 MG per tablet Take 1 tablet by mouth every 8 (eight) hours as needed for moderate pain.  20 tablet  0  . meloxicam (MOBIC) 7.5 MG tablet daily.       No current facility-administered medications for this visit.    Review of Systems Review of Systems  Constitutional: Negative for fever, chills and unexpected weight change.  HENT: Negative for congestion, hearing loss, sore throat, trouble swallowing and voice change.   Eyes: Negative for visual disturbance.  Respiratory: Negative for cough and wheezing.   Cardiovascular: Negative for chest pain, palpitations and leg swelling.  Gastrointestinal: Negative for nausea, vomiting, abdominal pain, diarrhea, constipation, blood in stool, abdominal distention, anal bleeding and rectal pain.   Genitourinary: Positive for testicular pain. Negative for hematuria and difficulty urinating.  Musculoskeletal: Negative for arthralgias.  Skin: Negative for rash and wound.  Neurological: Negative for seizures, syncope, weakness and headaches.  Hematological: Negative for adenopathy. Does not bruise/bleed easily.  Psychiatric/Behavioral: Negative for confusion.    Blood pressure 132/64, pulse 68, temperature 98.6 F (37 C), temperature source Oral, resp. rate 14, height 5' 10.5" (1.791 m), weight 148 lb 3.2 oz (67.223 kg).  Physical Exam Physical Exam  Constitutional: He is oriented to person, place, and time. He appears well-developed and well-nourished.  HENT:  Head: Normocephalic and atraumatic.  Eyes: Pupils are equal, round, and reactive to light. No scleral icterus.  Neck: Normal range of motion.  Cardiovascular: Normal rate and regular rhythm.   Pulmonary/Chest: Effort normal and breath sounds normal.  Abdominal: A hernia is present. Hernia confirmed positive in the right inguinal area and confirmed positive in the left inguinal area.  Musculoskeletal: Normal range of motion.  Lymphadenopathy:    He has no cervical adenopathy.  Neurological: He is alert and oriented to person, place, and time.  Skin: Skin is warm and dry.  Psychiatric: He has a normal mood and affect. His behavior is normal. Judgment and thought content normal.    Data Reviewed Urgent care notes  Assessment    Bilateral inguinal hernia left greater than  Right reducible symptomatic.     Plan    Discussed options  of open versus laparoscopic repair. Discussed the pros and cons of each as well as long term expectations of each. Given that he is a heavy smoker and does a lot of heavy lifting at work, we both felt that open repairs would give him the best ability to avoid reoperation since he is at high risk given his occupation and tobacco consumption.  The risk of hernia repair include bleeding,   Infection,   Recurrence of the hernia,  Mesh use, chronic pain,  Organ injury,  Bowel injury,  Bladder injury,   nerve injury with numbness around the incision,  Death,  and worsening of preexisting  medical problems.  The alternatives to surgery have been discussed as well..  Long term expectations of both operative and non operative treatments have been discussed.   The patient agrees to proceed.       Scotlyn Mccranie A. Carriann Hesse 03/26/2014, 2:33 PM

## 2014-03-26 NOTE — Patient Instructions (Signed)

## 2014-03-26 NOTE — Addendum Note (Signed)
Addended by: Harriette Bouillon A on: 03/26/2014 02:58 PM   Modules accepted: Orders

## 2014-04-09 ENCOUNTER — Telehealth (INDEPENDENT_AMBULATORY_CARE_PROVIDER_SITE_OTHER): Payer: Self-pay

## 2014-04-09 NOTE — Telephone Encounter (Signed)
Not a policy to give out pain meds preop.  He received one script but that's all I allow.  If in that much pain may need to go to ED otherwise more will be given after surgery.

## 2014-04-09 NOTE — Telephone Encounter (Signed)
Patient states he is out of norco 10/325mg  , he surgery is not scheduled until 05/10/14 .He states the Norco does help the pain. Advised him we will call him with DR. Cornetts recommendations .

## 2014-04-09 NOTE — Telephone Encounter (Signed)
Patient informed DR. Cornett will not be authorizing pain medication prior to surgery if his pain becomes  worse he need to go the ED For elevation  . Patient verbalized understanding

## 2014-05-03 ENCOUNTER — Ambulatory Visit (INDEPENDENT_AMBULATORY_CARE_PROVIDER_SITE_OTHER): Payer: BC Managed Care – PPO | Admitting: Family Medicine

## 2014-05-03 VITALS — BP 106/60 | HR 65 | Temp 99.2°F | Ht 70.25 in | Wt 149.2 lb

## 2014-05-03 DIAGNOSIS — L02219 Cutaneous abscess of trunk, unspecified: Secondary | ICD-10-CM

## 2014-05-03 DIAGNOSIS — Z8614 Personal history of Methicillin resistant Staphylococcus aureus infection: Secondary | ICD-10-CM

## 2014-05-03 DIAGNOSIS — L03319 Cellulitis of trunk, unspecified: Secondary | ICD-10-CM

## 2014-05-03 LAB — CBC WITH DIFFERENTIAL/PLATELET
Basophils Absolute: 0 10*3/uL (ref 0.0–0.1)
Basophils Relative: 0 % (ref 0–1)
Eosinophils Absolute: 0.3 10*3/uL (ref 0.0–0.7)
Eosinophils Relative: 3 % (ref 0–5)
HCT: 38.6 % — ABNORMAL LOW (ref 39.0–52.0)
Hemoglobin: 13.3 g/dL (ref 13.0–17.0)
Lymphocytes Relative: 31 % (ref 12–46)
Lymphs Abs: 3 10*3/uL (ref 0.7–4.0)
MCH: 31.1 pg (ref 26.0–34.0)
MCHC: 34.5 g/dL (ref 30.0–36.0)
MCV: 90.2 fL (ref 78.0–100.0)
Monocytes Absolute: 1 10*3/uL (ref 0.1–1.0)
Monocytes Relative: 10 % (ref 3–12)
Neutro Abs: 5.5 10*3/uL (ref 1.7–7.7)
Neutrophils Relative %: 56 % (ref 43–77)
Platelets: 194 10*3/uL (ref 150–400)
RBC: 4.28 MIL/uL (ref 4.22–5.81)
RDW: 13.8 % (ref 11.5–15.5)
WBC: 9.8 10*3/uL (ref 4.0–10.5)

## 2014-05-03 MED ORDER — DOXYCYCLINE HYCLATE 100 MG PO TABS: 100.0000 mg | ORAL_TABLET | Freq: Two times a day (BID) | ORAL | Status: DC

## 2014-05-03 MED ORDER — SULFAMETHOXAZOLE-TMP DS 800-160 MG PO TABS: 1.0000 | ORAL_TABLET | Freq: Two times a day (BID) | ORAL | Status: DC

## 2014-05-03 MED ORDER — HYDROCODONE-ACETAMINOPHEN 5-325 MG PO TABS: 1.0000 | ORAL_TABLET | Freq: Three times a day (TID) | ORAL | Status: DC | PRN

## 2014-05-03 NOTE — Progress Notes (Signed)
   Subjective:    Patient ID: Ryan Spencer, male    DOB: 01/23/1986, 28 y.o.   MRN: [id number]  HPI 28 year old male presents for evaluation of 3 day history of an abscess on his stomach. States he has had these in the past and does have a hx of MRSA.  He admits he had leftover doxycycline and has taken 5 doses of this.  Despite tx with antibiotic, the area has continued to become larger, more erythematous, and more painful.  Has a slight headache today but denies fever, chills, nausea, vomiting, or abdominal pain. No drainage from the wound.    Review of Systems  Constitutional: Negative for fever and chills.  Gastrointestinal: Negative for nausea, vomiting and abdominal pain.  Skin: Positive for color change and wound.  Neurological: Positive for headaches. Negative for dizziness.       Objective:   Physical Exam  Constitutional: He is oriented to person, place, and time. He appears well-developed and well-nourished.  HENT:  Head: Normocephalic and atraumatic.  Right Ear: External ear normal.  Left Ear: External ear normal.  Eyes: Conjunctivae are normal.  Neck: Normal range of motion.  Cardiovascular: Normal rate.   Pulmonary/Chest: Effort normal.  Neurological: He is alert and oriented to person, place, and time.  Skin:     Psychiatric: He has a normal mood and affect. His behavior is normal. Judgment and thought content normal.     Attempted to unroof scab to collect culture. No purulence expressed. Area of erythema marked just outside border with skin scribe.     Assessment & Plan:  Cellulitis and abscess of trunk - Plan: CBC with Differential, Wound culture, doxycycline (VIBRA-TABS) 100 MG tablet, sulfamethoxazole-trimethoprim (BACTRIM DS) 800-160 MG per tablet, HYDROcodone-acetaminophen (NORCO) 5-325 MG per tablet  Personal history of MRSA (methicillin resistant Staphylococcus aureus)  Stat CBC pending Plan to continue doxycycline 100 mg bid and add septra ds  twice daily x 10 days emphasixed the importance of completing the ENTIRE course of antibiotics Strict RTC precautions discussed. To ED if acutely worse - fever>100, erythema spreading outside marked area, nausea, vomiting, abdo pain, etc.  Recheck here if not improving in 48 hours.

## 2014-05-07 NOTE — Progress Notes (Signed)
History and physical examinations reviewed with Rhoderick MoodyHeather Marte, PA-C.  Agree with A/P.

## 2014-05-10 ENCOUNTER — Encounter (HOSPITAL_BASED_OUTPATIENT_CLINIC_OR_DEPARTMENT_OTHER): Admission: RE | Payer: Self-pay | Source: Ambulatory Visit

## 2014-05-10 ENCOUNTER — Ambulatory Visit (HOSPITAL_BASED_OUTPATIENT_CLINIC_OR_DEPARTMENT_OTHER): Admission: RE | Admit: 2014-05-10 | Payer: BC Managed Care – PPO | Source: Ambulatory Visit | Admitting: Surgery

## 2014-05-10 SURGERY — REPAIR, HERNIA, INGUINAL, BILATERAL, ADULT
Anesthesia: General

## 2014-06-17 ENCOUNTER — Ambulatory Visit (INDEPENDENT_AMBULATORY_CARE_PROVIDER_SITE_OTHER): Payer: BC Managed Care – PPO | Admitting: Family Medicine

## 2014-06-17 VITALS — BP 116/60 | HR 71 | Temp 98.5°F | Resp 12 | Ht 70.25 in | Wt 149.1 lb

## 2014-06-17 DIAGNOSIS — K4020 Bilateral inguinal hernia, without obstruction or gangrene, not specified as recurrent: Secondary | ICD-10-CM

## 2014-06-17 DIAGNOSIS — L03319 Cellulitis of trunk, unspecified: Principal | ICD-10-CM

## 2014-06-17 DIAGNOSIS — L02219 Cutaneous abscess of trunk, unspecified: Secondary | ICD-10-CM

## 2014-06-17 MED ORDER — DOXYCYCLINE HYCLATE 100 MG PO TABS: 100.0000 mg | ORAL_TABLET | Freq: Two times a day (BID) | ORAL | Status: DC

## 2014-06-17 MED ORDER — HYDROCODONE-ACETAMINOPHEN 5-325 MG PO TABS: 1.0000 | ORAL_TABLET | Freq: Three times a day (TID) | ORAL | Status: DC | PRN

## 2014-06-17 NOTE — Progress Notes (Signed)
[facility] [address] [place] [address] [phone]  Date:  06/17/2014   Name:  Ryan Spencer   DOB:  1986-02-06   MRN:  [id number]  PCP:  Rogelia Boga, MD    Chief Complaint: Abscess and Hernia   History of Present Illness:  Ryan Spencer is a 28 y.o. very pleasant male patient who presents with the following:  History of MRSA abscesses in the past. He notes recurrent abscesses in his right axilla over the last couple of weeks- seemed to get better and then came back over the last couple of days.  The areas are not big, but they are sore.  No drainage as of yet.   He also has a known hernia in his bilateral inguinal areas.  He was supposed to have an operation to fix them in July but had to reschedule.  He has noted more pain in these areas over the last week or so.  He sees Dr. Luisa Hart.  He has noted these hernias for about 5 months now.  He will need to take about 8 weeks off from work after his repair and is having a hard time finding the opportunity to do this - it will be difficult financially for his family for him to be out of work.  He plans to upgrade his disability insurance for next year and hopes to be able to do the repair then   No fever, nausea or vomiting  He is otherwise generally in good health .   Patient Active Problem List   Diagnosis Date Noted  . MRSA cellulitis 05/26/2013  . CARPAL TUNNEL SYNDROME, LEFT 05/07/2009  . RHUS DERMATITIS 04/11/2009  . TOBACCO ABUSE 11/19/2008  . URI 09/24/2008  . ARTHRALGIA 08/07/2008    Past Medical History  Diagnosis Date  . MRSA (methicillin resistant staph aureus) culture positive   . Abscess   . Arthritis     No past surgical history on file.  History  Substance Use Topics  . Smoking status: Current Every Day Smoker -- 1.00 packs/day    Types: Cigarettes  . Smokeless tobacco: Never Used  . Alcohol Use: Yes     Comment: occasional    Family History  Problem  Relation Age of Onset  . Arthritis Mother     Allergies  Allergen Reactions  . Tramadol Other (See Comments)    Headache    Medication list has been reviewed and updated.  Current Outpatient Prescriptions on File Prior to Visit  Medication Sig Dispense Refill  . HYDROcodone-acetaminophen (NORCO) 5-325 MG per tablet Take 1 tablet by mouth every 8 (eight) hours as needed.  20 tablet  0  . meloxicam (MOBIC) 7.5 MG tablet daily.       No current facility-administered medications on file prior to visit.    Review of Systems:  As per HPI- otherwise negative.   Physical Examination: Filed Vitals:   06/17/14 1603  BP: 116/60  Pulse: 71  Temp: 98.5 F (36.9 C)  Resp: 12   Filed Vitals:   06/17/14 1603  Height: 5' 10.25" (1.784 m)  Weight: 149 lb 2 oz (67.643 kg)   Body mass index is 21.25 kg/(m^2). Ideal Body Weight: Weight in (lb) to have BMI = 25: 175.1  GEN: WDWN, NAD, Non-toxic, A & O x 3, looks well, slim build HEENT: Atraumatic, Normocephalic. Neck supple. No masses, No LAD. Ears and Nose: No external deformity. CV: RRR, No M/G/R. No JVD.  No thrill. No extra heart sounds. PULM: CTA B, no wheezes, crackles, rhonchi. No retractions. No resp. distress. No accessory muscle use. ABD: S, NT, ND, No rebound. No HSM. EXTR: No c/c/e NEURO Normal gait.  PSYCH: Normally interactive. Conversant. Not depressed or anxious appearing.  Calm demeanor.  Right axilla: there is a small abscess in the right axilla that is tender and slightly fluctuant Groin: he has a palpable hernia on the left that is reducible.  I cannot appreciate a hernia on the right at this time.    VC obtained- prepped with betadine and then anesthesia iwht a small wheal of lidocaine 2%.  I and D with 11 blade.    Assessment and Plan: Cellulitis and abscess of trunk - Plan: doxycycline (VIBRA-TABS) 100 MG tablet, HYDROcodone-acetaminophen (NORCO) 5-325 MG per tablet  Bilateral inguinal hernia without  obstruction or gangrene, recurrence not specified - Plan: HYDROcodone-acetaminophen (NORCO) 5-325 MG per tablet  I and D of abscess as above. Doxycycline antibiotic.  May use hydrocodone as needed for pain  At this time I do not see evidence of strangulation or incarceration at this time.  However cautioned him to watch our for any sign of a dangerous complication from his hernia.  Otherwise he will plan to have this repair as soon as he is able   Signed Abbe Amsterdam, MD

## 2014-06-17 NOTE — Patient Instructions (Signed)
Use the doxycycline twice a day as directed for your abscess. Keep the area covered and try some warm compresses as needed.  Let me know if it is not better.   I do think that your hernia will likely make it until you can up your insurance.  However, keep in contact with your surgeon and be sure to seek care right away if you develop severe or persistent pain.    You can use the pain medication as needed but remember it will make you sleepy- do not drive while using it

## 2014-07-05 ENCOUNTER — Telehealth: Payer: Self-pay

## 2014-07-05 DIAGNOSIS — L02219 Cutaneous abscess of trunk, unspecified: Secondary | ICD-10-CM

## 2014-07-05 DIAGNOSIS — K4020 Bilateral inguinal hernia, without obstruction or gangrene, not specified as recurrent: Secondary | ICD-10-CM

## 2014-07-05 DIAGNOSIS — L03319 Cellulitis of trunk, unspecified: Principal | ICD-10-CM

## 2014-07-05 NOTE — Telephone Encounter (Signed)
Patient is calling regarding his phone message yesterday for refill on HYDROcodone-acetaminophen (NORCO) 5-325 MG per tablet  Did not see an earlier message   [phone]

## 2014-07-06 MED ORDER — HYDROCODONE-ACETAMINOPHEN 5-325 MG PO TABS: 1.0000 | ORAL_TABLET | Freq: Three times a day (TID) | ORAL | Status: DC | PRN

## 2014-07-06 NOTE — Telephone Encounter (Signed)
Pt CB to see if Rx was ready, reporting that he had spoken to Dr Patsy Lager yesterday and she told him it would be ready sometime today. I advised pt that she is in the office and I will remind her to get it ready. He should be able to p/up after an hour or so. Pt agreed.

## 2014-07-06 NOTE — Telephone Encounter (Signed)
Rx printed/signed and in drawer for p/up.

## 2014-07-30 ENCOUNTER — Telehealth: Payer: Self-pay

## 2014-07-30 DIAGNOSIS — L02219 Cutaneous abscess of trunk, unspecified: Secondary | ICD-10-CM

## 2014-07-30 DIAGNOSIS — L03319 Cellulitis of trunk, unspecified: Principal | ICD-10-CM

## 2014-07-30 DIAGNOSIS — K4020 Bilateral inguinal hernia, without obstruction or gangrene, not specified as recurrent: Secondary | ICD-10-CM

## 2014-07-30 MED ORDER — HYDROCODONE-ACETAMINOPHEN 5-325 MG PO TABS: 1.0000 | ORAL_TABLET | Freq: Three times a day (TID) | ORAL | Status: DC | PRN

## 2014-07-30 NOTE — Telephone Encounter (Signed)
Called him back,  He is continuing to use pain medications for chronic hernia pain until he can afford to get his hernia fixed.  Cautioned him that if he is getting worse he needs to be seen, not just mask the pain.  He understands

## 2014-07-30 NOTE — Telephone Encounter (Signed)
Pt called in and states he needs a refill for Oxycodone. He can be reached @ [phone]. Thank you

## 2014-08-03 ENCOUNTER — Emergency Department (INDEPENDENT_AMBULATORY_CARE_PROVIDER_SITE_OTHER)
Admission: EM | Admit: 2014-08-03 | Discharge: 2014-08-03 | Disposition: A | Discharge status: Home / Self Care | Payer: BC Managed Care – PPO | Source: Home / Self Care | Attending: Emergency Medicine | Admitting: Emergency Medicine

## 2014-08-03 ENCOUNTER — Encounter (HOSPITAL_COMMUNITY): Payer: Self-pay | Admitting: Emergency Medicine

## 2014-08-03 DIAGNOSIS — L0291 Cutaneous abscess, unspecified: Secondary | ICD-10-CM

## 2014-08-03 MED ORDER — SULFAMETHOXAZOLE-TMP DS 800-160 MG PO TABS: 2.0000 | ORAL_TABLET | Freq: Two times a day (BID) | ORAL | Status: DC

## 2014-08-03 MED ORDER — MUPIROCIN 2 % EX OINT: TOPICAL_OINTMENT | CUTANEOUS | Status: DC

## 2014-08-03 MED ORDER — LIDOCAINE-EPINEPHRINE (PF) 2 %-1:200000 IJ SOLN
INTRAMUSCULAR | Status: AC
  Filled 2014-08-03: qty 20

## 2014-08-03 MED ORDER — OXYCODONE-ACETAMINOPHEN 5-325 MG PO TABS: ORAL_TABLET | ORAL | Status: DC

## 2014-08-03 NOTE — ED Provider Notes (Signed)
  Chief Complaint   Abscess   History of Present Illness   Ryan Spencer is a 28 year old male who has had a four-day history of a painful boil on his right posterior thigh. He denies any drainage. He's felt warm but no definite fever or chills. A year ago he had an abscess in the same area. He's had a history of multiple abscesses in the past, at least one of which showed MRSA.  Review of Systems   Other than as noted above, the patient denies any of the following symptoms: Systemic:  No fever, chills or sweats. Skin:  No rash or itching.  PMFSH   Past medical history, family history, social history, meds, and allergies were reviewed.   Physical Examination     Vital signs:  BP 122/68  Pulse 72  Temp(Src) 98.7 F (37.1 C) (Oral)  Resp 16  Ht 5' 10.5" (1.791 m)  Wt 155 lb (70.308 kg)  BMI 21.92 kg/m2  SpO2 99% Skin:  There is a 3 cm abscess in the right posterior thigh with a 12 cm halo of erythema surrounding it. The abscess is indurated, hard, and tender. It's not draining any pus..  There was no crepitus, necrosis, ecchymosis, or herrhagic bullae. Skin exam was otherwise normal.  No rash. Ext:  Distal pulses were full, patient has full ROM of all joints.  Procedure Note:  Verbal informed consent was obtained.  The patient was informed of the risks and benefits of the procedure and understands and accepts.  A time out was called and the identity of the patient and correct procedure was confirmed.   The abscess area described above was prepped with Betadine and alcohol and anesthetized with 5 mL of 2% Xylocaine with epinephrine.  Using a #11 scalpel blade, a singe straight incision was made into the area of fluctulence, yielding a small amount of prurulent drainage.  Routine cultures were obtained.  Blunt dissection was used to break up loculations and the resulting wound cavity was packed with 1/4 inch Iodoform gauze.  A sterile pressure dressing was applied.   Assessment    The encounter diagnosis was Abscess.  He has had recurring abscesses, so he will need decontamination regimen.  Plan     1.  Meds:  The following meds were prescribed:   Discharge Medication List as of 08/03/2014  7:22 PM    START taking these medications   Details  mupirocin ointment (BACTROBAN) 2 % Apply to both nostrils TID for 1 month, Normal    oxyCODONE-acetaminophen (PERCOCET) 5-325 MG per tablet 1 to 2 tablets every 6 hours as needed for pain., Print    sulfamethoxazole-trimethoprim (BACTRIM DS) 800-160 MG per tablet Take 2 tablets by mouth 2 (two) times daily., Starting 08/03/2014, Until Discontinued, Normal        2.  Patient Education/Counseling:  The patient was given appropriate handouts, wound care instructions, and instructed in pain control.  Instructed in a decontamination regimen consisting Clorox baths and mupirocin ointment to the nostrils.  3.  Follow up:  The patient was instructed to leave the dressing in place and return here again in 48 hours for packing removal, or sooner if becoming worse in any way, and given some red flag symptoms such as fever which would prompt immediate return.       Reuben Likesavid C Shaterra Sanzone, MD 08/03/14 2035

## 2014-08-03 NOTE — Discharge Instructions (Signed)
You have had an abscess drained.  An abscess is a collection of pus caused by infection with skin bacteria such as Streptococcus or Staphylococcus.  Since this is and infection, you may be contagious. ° °For the first 2 days, leave the dressing in place and keep it clean and dry. This means you should not get it wet.  You will have to take a sponge bath rather than a shower.  If the abscess was packed, we may instruct you to come back in 2 to 3 days to have the packing removed.  If the abscess was not packed, you may remove the dressing yourself in 2 days and take care of the wound yourself. ° °After the packing is out, change the dressing at least once a day.  You may bathe or shower once the packing has been removed.  Assemble all the dressing material before you change the dressing, wear gloves, dispose of the soiled dressing material well and wash your hands before and after changing the dressing.  Wash the area well with soap and water, taking care to remove all the dried blood and drainage.  Apply a thin layer of antibiotic ointment (Bacitracin or Polysporin) around the abscess cavity, then apply a gauze dressing.  You may want to use a non-adherent dressing like Telfa.  Fasten this in place well with tape.  Continue to change the dressing until there is no further drainage. ° °Finish up the entire prescription of any antibiotics that you have been given. ° °Take infectious precautions since the bacteria that cause these abscesses may be contagious.  Wash hands frequently or use hand sanitizer, especially after touching the abscess area or changing dressings.  Do not allow anyone else to use your towel or washcloth and wash these items after each use until the abscess has healed.  You may want to use an antibacterial soap such as Dial or Safeguard or a prescription body wash like Hibiclens.  You also may want to consider spraying the tub or shower with a disinfectant such a Lysol until the abscess has  healed. ° °Things that should prompt you to return to the office for a recheck include:  Fever over 100 degrees, increasing pain or drainage, failure of the abscess to heal after 10 days, or other skin lesions elsewhere.  ° °Bacterial infection is a leading cause of boils, abscesses and skin infections.  While this can be the cause of serious infections, it is most often easily treated, prevented, and cured. ° °This bacteria,like other bacterial infections, is something you catch from somebody or something.  It can be transmitted from family, friends, casual acquaintances or even pets by close person-to-person contact or contact with an infected object.  Bacteria live on the skin and in the nasal passages.  It can invade into the skin through a break in the skin or sometimes it just invades by itself into a skin pore causing an area of redness, swelling and pain.  When this happens, it can cause a sticking sensation, so many people mistake it for an insect bite.   ° °If it causes a collection of pus (a boil or abscess) it should be drained.  Often packing or a drain will be left in place to allow the pus to drain for a few days.  This packing will need to be removed after 2 to 3 days.  If the wound is deep, it may need to be repacked.  If there is no collection of   pus (cellulitis) incision and drainage is not immediately necessary, but antibiotics will be prescribed.  Sometimes cellulitis will turn into an abscess, so if symptoms persist, it's best to return for a recheck. ° °With bacterial infection, recurrence can be a problem.  This is because the bacteria can continue to live on the skin and in the nasal passages, presenting a risk for re-infection.  There are some steps that you can take to completely eradicate the infection and thus prevent future infections: ° °· Finish up your antibiotics completely.   °· Bacteria often take up residence in the nasal passages.  If they are not eliminated from this reservoir,  the infection will return again and again.  Apply an antibiotic ointment, mupirocin, to both nostrils 3 times daily for a month.  °· Decontamination of the skin is also necessary. The current recommendation is Clorox baths twice weekly for 3 months.  This can be done by diluting 4 oz of Clorox bleach in a tub of bathwater.  Then soak in this Clorox solution up to your neck for 15 minutes. When you get out of the tub, do not towel dry, allow the Clorox water to dry off your skin on its own.  °· Take infectious precautions:  Wash or sanitize hands frequently, spray tub or shower with Lysol after use, us towels or wash cloths only once, then launder, do not share towels or wash cloths with anyone else, do not share clothing with anyone else, launder clothing and bed clothes frequently. ° ° ° ° °

## 2014-08-03 NOTE — ED Notes (Signed)
C/o abscess post. R thigh onset Tuesday.  He tried warm compress last night 2 nights.  Tried to push something out of it, but just a little pus and blood came out.  Hx. MRSA.

## 2014-08-06 ENCOUNTER — Encounter (HOSPITAL_COMMUNITY): Payer: Self-pay | Admitting: Emergency Medicine

## 2014-08-06 ENCOUNTER — Emergency Department (INDEPENDENT_AMBULATORY_CARE_PROVIDER_SITE_OTHER)
Admission: EM | Admit: 2014-08-06 | Discharge: 2014-08-06 | Disposition: A | Discharge status: Home / Self Care | Payer: BC Managed Care – PPO | Source: Home / Self Care | Attending: Emergency Medicine | Admitting: Emergency Medicine

## 2014-08-06 DIAGNOSIS — L0291 Cutaneous abscess, unspecified: Secondary | ICD-10-CM

## 2014-08-06 DIAGNOSIS — Z4800 Encounter for change or removal of nonsurgical wound dressing: Secondary | ICD-10-CM

## 2014-08-06 MED ORDER — CEFTRIAXONE SODIUM 1 G IJ SOLR
1.0000 g | Freq: Once | INTRAMUSCULAR | Status: AC
  Administered 2014-08-06: 1 g via INTRAMUSCULAR

## 2014-08-06 MED ORDER — OXYCODONE-ACETAMINOPHEN 5-325 MG PO TABS: ORAL_TABLET | ORAL | Status: DC

## 2014-08-06 MED ORDER — LIDOCAINE-EPINEPHRINE (PF) 2 %-1:200000 IJ SOLN
INTRAMUSCULAR | Status: AC
  Filled 2014-08-06: qty 20

## 2014-08-06 MED ORDER — LIDOCAINE HCL (PF) 1 % IJ SOLN
INTRAMUSCULAR | Status: AC
  Filled 2014-08-06: qty 5

## 2014-08-06 MED ORDER — CEFTRIAXONE SODIUM 1 G IJ SOLR
INTRAMUSCULAR | Status: AC
  Filled 2014-08-06: qty 10

## 2014-08-06 MED ORDER — CLINDAMYCIN HCL 300 MG PO CAPS: 300.0000 mg | ORAL_CAPSULE | Freq: Four times a day (QID) | ORAL | Status: DC

## 2014-08-06 NOTE — ED Notes (Signed)
Recheck abscess that was evaluated on 10/9.  Patient reports this wound does not feel like the wounds he has had before .  This one continues to drain and remains sore.

## 2014-08-06 NOTE — Discharge Instructions (Signed)

## 2014-08-06 NOTE — ED Provider Notes (Signed)
Chief Complaint   Abscess   History of Present Illness   Ryan Spencer is a 28 year old male who returns for followup on abscess on his right posterior thigh. This was incised and drained 3 days ago. He removed the packing son. He feels like this not healing up as rapidly as he was hoping it would. It still red, swollen, tender, and draining pus. He denies any fever or chills. The culture showed staph aureus, sensitivities are not back yet.  Review of Systems   Other than as noted above, the patient denies any of the following symptoms: Systemic:  No fever, chills or sweats. Skin:  No rash or itching.  PMFSH   Past medical history, family history, social history, meds, and allergies were reviewed.   Physical Examination     Vital signs:  BP 119/69  Pulse 70  Temp(Src) 98.2 F (36.8 C) (Oral)  Resp 14  SpO2 100% Skin:  Exam of the abscess appears that is actually looking better than it did 3 days ago. There is no halo of erythema surrounding the abscess. He still has a 2 cm, indurated abscess. There was some necrotic debris in the abscess cavity. There is no fluctuance.  There was no crepitus, necrosis, ecchymosis, or herrhagic bullae. Skin exam was otherwise normal.  No rash. Ext:  Distal pulses were full, patient has full ROM of all joints.  Procedure Note:  Verbal informed consent was obtained.  The patient was informed of the risks and benefits of the procedure and understands and accepts.  A time out was called and the identity of the patient and correct procedure was confirmed.   The abscess area described above was prepped with Betadine and alcohol and anesthetized with 5 mL of 2% Xylocaine with epinephrine.  I did not feel it necessary to do any further incision and drainage since there did not appear to be any fluctuance. The abscess cavity had some necrotic debris in it. This was cleaned out and the wound was again probed to break up any loculations.  Blunt dissection  was used to break up loculations and the resulting wound cavity was packed with 1/4 inch Iodoform gauze.  A sterile pressure dressing was applied.   Course in Urgent Care Center   The following medications were given:  Medications  cefTRIAXone (ROCEPHIN) injection 1 g (1 g Intramuscular Given 08/06/14 2009)   Assessment   The encounter diagnosis was Abscess.  This actually appears to be healing up well. I think we just need to give a little bit more time. The patient felt that the Septra was working well enough, so will switch to clindamycin.  Plan     1.  Meds:  The following meds were prescribed:   New Prescriptions   CLINDAMYCIN (CLEOCIN) 300 MG CAPSULE    Take 1 capsule (300 mg total) by mouth 4 (four) times daily.   OXYCODONE-ACETAMINOPHEN (PERCOCET) 5-325 MG PER TABLET    1 to 2 tablets every 6 hours as needed for pain.    2.  Patient Education/Counseling:  The patient was given appropriate handouts, wound care instructions, and instructed in pain control.    3.  Follow up:  The patient was instructed to leave the dressing in place and return here again in 48 hours for packing removal, or sooner if becoming worse in any way, and given some red flag symptoms such as fever which would prompt immediate return.       Reuben Likesavid C Alayah Knouff, MD  08/06/14 2023 

## 2014-08-07 LAB — CULTURE, ROUTINE-ABSCESS
Gram Stain: NONE SEEN
Special Requests: NORMAL

## 2014-08-08 ENCOUNTER — Emergency Department (INDEPENDENT_AMBULATORY_CARE_PROVIDER_SITE_OTHER)
Admission: EM | Admit: 2014-08-08 | Discharge: 2014-08-08 | Disposition: A | Discharge status: Home / Self Care | Payer: BC Managed Care – PPO | Source: Home / Self Care | Attending: Emergency Medicine | Admitting: Emergency Medicine

## 2014-08-08 ENCOUNTER — Encounter (HOSPITAL_COMMUNITY): Payer: Self-pay | Admitting: Emergency Medicine

## 2014-08-08 DIAGNOSIS — Z4800 Encounter for change or removal of nonsurgical wound dressing: Secondary | ICD-10-CM

## 2014-08-08 DIAGNOSIS — L0291 Cutaneous abscess, unspecified: Secondary | ICD-10-CM

## 2014-08-08 MED ORDER — BACITRACIN ZINC 500 UNIT/GM EX OINT
TOPICAL_OINTMENT | CUTANEOUS | Status: AC
  Filled 2014-08-08: qty 3.6

## 2014-08-08 MED ORDER — DOXYCYCLINE HYCLATE 100 MG PO TABS: 100.0000 mg | ORAL_TABLET | Freq: Two times a day (BID) | ORAL | Status: DC

## 2014-08-08 MED ORDER — HYDROCODONE-ACETAMINOPHEN 5-325 MG PO TABS: ORAL_TABLET | ORAL | Status: DC

## 2014-08-08 NOTE — Discharge Instructions (Signed)
Stop clindamycin and take full prescription for doxycycline.  Now that the packing has been removed from your abscess, you may bathe and shower as normal.  You should change the dressing at least once a day.  You may change it more often if it becomes soiled with drainage.  Wash the wound well with soap and water, pat dry, apply an antibiotic ointment (Neosporin, Polysporin, or Bacitracin are all OK), then cover with a non-stick dressing such as Telfa with several layers of plain gause over that to absorb drainage.  You may secure this in place with tape or with roll gauze and tape.  Keep the wound covered for until it stops draining.  This usually takes 7 days, but may be longer.  Return for a recheck if you have heavy bleeding, increasing pain, fever, or the wound looks worse.    Bacterial infection is a leading cause of boils, abscesses and skin infections.  While this can be the cause of serious infections, it is most often easily treated, prevented, and cured.  This bacteria,like other bacterial infections, is something you catch from somebody or something.  It can be transmitted from family, friends, casual acquaintances or even pets by close person-to-person contact or contact with an infected object.  Bacteria live on the skin and in the nasal passages.  It can invade into the skin through a break in the skin or sometimes it just invades by itself into a skin pore causing an area of redness, swelling and pain.  When this happens, it can cause a sticking sensation, so many people mistake it for an insect bite.    If it causes a collection of pus (a boil or abscess) it should be drained.  Often packing or a drain will be left in place to allow the pus to drain for a few days.  This packing will need to be removed after 2 to 3 days.  If the wound is deep, it may need to be repacked.  If there is no collection of pus (cellulitis) incision and drainage is not immediately necessary, but antibiotics will  be prescribed.  Sometimes cellulitis will turn into an abscess, so if symptoms persist, it's best to return for a recheck.  With bacterial infection, recurrence can be a problem.  This is because the bacteria can continue to live on the skin and in the nasal passages, presenting a risk for re-infection.  There are some steps that you can take to completely eradicate the infection and thus prevent future infections:   Finish up your antibiotics completely.    Bacteria often take up residence in the nasal passages.  If they are not eliminated from this reservoir, the infection will return again and again.  Apply an antibiotic ointment, mupirocin, to both nostrils 3 times daily for a month.   Decontamination of the skin is also necessary. The current recommendation is Clorox baths twice weekly for 3 months.  This can be done by diluting 4 oz of Clorox bleach in a tub of bathwater.  Then soak in this Clorox solution up to your neck for 15 minutes. When you get out of the tub, do not towel dry, allow the Clorox water to dry off your skin on its own.   Take infectious precautions:  Wash or sanitize hands frequently, spray tub or shower with Lysol after use, us towels or wash cloths only once, then launder, do not share towels or wash cloths with anyone else, do not share clothing  with anyone else, launder clothing and bed clothes frequently.

## 2014-08-08 NOTE — ED Provider Notes (Signed)
  Chief Complaint   Wound Check   History of Present Illness   Ryan HurtMichael V Spencer is a 28 year old male who returns for a further recheck on an abscess on his right posterior thigh. This was incised and drained about 5 days ago. He came back because it wasn't improving 2 days ago and it was cleaned out and repacked again. He comes in to get the packing out. He states it is still painful. He denies any fever. He has been taking the clindamycin. No medication side effects. Results of the culture had come back showing MRSA which is resistant to both Septra and clindamycin. Fortunately it is sensitive to doxycycline.  Review of Systems   Other than as noted above, the patient denies any of the following symptoms: Systemic:  No fever, chills or sweats. Skin:  No rash or itching.  PMFSH   Past medical history, family history, social history, meds, and allergies were reviewed.   Physical Examination     Vital signs:  BP 110/62  Pulse 71  Temp(Src) 98.5 F (36.9 C) (Oral)  Resp 16  SpO2 98% Skin:  The dressing and packing were removed. The abscess looks a lot better. There is no surrounding induration or erythema. Abscess cavity appears clean without any exudate or drainage.  There was no crepitus, necrosis, ecchymosis, or herrhagic bullae. Skin exam was otherwise normal.  No rash. Ext:  Distal pulses were full, patient has full ROM of all joints.  Assessment   The encounter diagnosis was Abscess.  It looks like it's healing up well, despite being on the wrong antibiotic. The patient had been called previously and told to stop the clindamycin and switch to doxycycline. Suggest to finish a ten-day course of this and then try a decontamination protocol since she's had multiple infections like this in the past. He requests a refill on his Percocet, but I think we can step down to hydrocodone, since it appears to be healing up well.  Plan     1.  Meds:  The following meds were prescribed:    Discharge Medication List as of 08/08/2014 12:58 PM    START taking these medications   Details  !! HYDROcodone-acetaminophen (NORCO/VICODIN) 5-325 MG per tablet 1 to 2 tabs every 4 to 6 hours as needed for pain., Print     !! - Potential duplicate medications found. Please discuss with provider.      2.  Patient Education/Counseling:  The patient was given appropriate handouts, wound care instructions, and instructed in pain control.    3.  Follow up:  The patient was instructed to leave the dressing in place and return here again in 48 hours for packing removal, or sooner if becoming worse in any way, and given some red flag symptoms such as fever which would prompt immediate return.       Reuben Likesavid C Oney Folz, MD 08/08/14 [phone]

## 2014-08-08 NOTE — ED Notes (Signed)
Pt  Here  For  wouund  Check   Seen recently   ucc  Had  Anti  Biotics  Changed   -  Pt  States is  Doing  Better

## 2014-08-08 NOTE — ED Notes (Signed)
The patient's culture came back positive for MRSA. This was resistant to Septra and also clindamycin was sensitive to tetracycline to doxycycline. A message was left on his voicemail to stop the clindamycin, switch to doxycycline 100 mg, #20, 1 twice a day to be [facility] [address] He is to come back the next day or 2 for packing removal and recheck.  Reuben Likesavid C Sundi Slevin, MD 08/08/14 [phone]

## 2014-08-09 ENCOUNTER — Telehealth (HOSPITAL_COMMUNITY): Payer: Self-pay | Admitting: *Deleted

## 2014-08-09 NOTE — ED Notes (Signed)
I called pt.  Pt. verified x 2 and given results.  Pt. said he was already notified by the doctor yesterday to switch antibiotics and given instructions.  Instructed to finish all of antibiotics and use Doxycycline for any further MRSA infections. erythromycin 08/09/2014

## 2014-08-13 ENCOUNTER — Telehealth: Payer: Self-pay

## 2014-08-13 NOTE — Telephone Encounter (Signed)
Pt in need of his HYDROCODONE 5-325 MGS. Please call [phone] when ready for pick up

## 2014-08-13 NOTE — Telephone Encounter (Signed)
Pulled controlled substance report- he filled 2rx for percocet 5 #20 and 1 rx for vicodin 5 #20 in the last 10 days. This is #60 pills in the last 10 days. He tells me this was from an abscess on his leg treated at the urgent care. This may be so, but I let him know I cannot write him more narcotics at this time. He states that he understands.

## 2014-09-05 ENCOUNTER — Emergency Department (HOSPITAL_COMMUNITY)
Admission: EM | Admit: 2014-09-05 | Discharge: 2014-09-05 | Disposition: A | Discharge status: Home / Self Care | Payer: BC Managed Care – PPO | Source: Home / Self Care | Attending: Emergency Medicine | Admitting: Emergency Medicine

## 2014-09-05 ENCOUNTER — Encounter (HOSPITAL_COMMUNITY): Payer: Self-pay | Admitting: *Deleted

## 2014-09-05 DIAGNOSIS — L03311 Cellulitis of abdominal wall: Secondary | ICD-10-CM

## 2014-09-05 MED ORDER — HYDROCODONE-ACETAMINOPHEN 5-325 MG PO TABS: 1.0000 | ORAL_TABLET | Freq: Four times a day (QID) | ORAL | Status: DC | PRN

## 2014-09-05 MED ORDER — DOXYCYCLINE HYCLATE 100 MG PO TABS: 100.0000 mg | ORAL_TABLET | Freq: Two times a day (BID) | ORAL | Status: DC

## 2014-09-05 NOTE — ED Notes (Signed)
Pt  Has   A  reddned    Area    Above the   suprapubic   Area        Tender  To  The  Touch   -   The  Area is  Not  Draining           -  The   Pt  Has  A  History  Of  mrsa  Infections  In the  past

## 2014-09-05 NOTE — Discharge Instructions (Signed)
You have a skin infection. Please take the doxycycline to treat this.  If your pain worsens, you develop fever, the rash spreads on antibiotics, you develop nausea, vomiting, or drainage please seek medical attention.    Cellulitis Cellulitis is an infection of the skin and the tissue under the skin. The infected area is usually red and tender. This happens most often in the arms and lower legs. HOME CARE   Take your antibiotic medicine as told. Finish the medicine even if you start to feel better.  Keep the infected arm or leg raised (elevated).  Put a warm cloth on the area up to 4 times per day.  Only take medicines as told by your doctor.  Keep all doctor visits as told. GET HELP IF:  You see red streaks on the skin coming from the infected area.  Your red area gets bigger or turns a dark color.  Your bone or joint under the infected area is painful after the skin heals.  Your infection comes back in the same area or different area.  You have a puffy (swollen) bump in the infected area.  You have new symptoms.  You have a fever. GET HELP RIGHT AWAY IF:   You feel very sleepy.  You throw up (vomit) or have watery poop (diarrhea).  You feel sick and have muscle aches and pains. MAKE SURE YOU:   Understand these instructions.  Will watch your condition.  Will get help right away if you are not doing well or get worse. Document Released: 03/30/2008 Document Revised: 02/26/2014 Document Reviewed: 12/28/2011 [facility] Patient Information 2015 [place]. This information is not intended to replace advice given to you by your health care provider. Make sure you discuss any questions you have with your health care provider.

## 2014-09-05 NOTE — ED Provider Notes (Signed)
CSN: [id number]     Arrival date & time 09/05/14  1414 History   First MD Initiated Contact with Patient 09/05/14 1434     Chief Complaint  Patient presents with  . Abscess   (Consider location/radiation/quality/duration/timing/severity/associated sxs/prior Treatment) HPI Patient is a 28 yo male with history of MRSA abscess presenting with pain and redness in suprapubic region. Notes this started as a pimple Monday morning. He popped it. Later that day he noted increased pain and redness. Has become more uncomfortable. Has tried warm compresses on this. Has not drained any purulent material. Denies fevers, nausea, and vomiting. Notes he has bilateral inguinal hernias. These are always able to reduce. He notes he finished his antibiotics for his MRSA abscess that was diagnoses one month ago.   Past Medical History  Diagnosis Date  . MRSA (methicillin resistant staph aureus) culture positive   . Abscess   . Arthritis    History reviewed. No pertinent past surgical history. Family History  Problem Relation Age of Onset  . Arthritis Mother    History  Substance Use Topics  . Smoking status: Current Every Day Smoker -- 1.00 packs/day    Types: Cigarettes  . Smokeless tobacco: Never Used  . Alcohol Use: Yes     Comment: occasional    Review of Systems  Constitutional: Negative for fever and chills.  Gastrointestinal: Negative for nausea and vomiting.  Skin: Positive for rash.    Allergies  Tramadol  Home Medications   Prior to Admission medications   Medication Sig Start Date End Date Taking? Authorizing Provider  doxycycline (VIBRA-TABS) 100 MG tablet Take 1 tablet (100 mg total) by mouth 2 (two) times daily. 09/05/14   Glori LuisEric G Miraya Cudney, MD  HYDROcodone-acetaminophen (NORCO/VICODIN) 5-325 MG per tablet Take 1 tablet by mouth every 6 (six) hours as needed for moderate pain. 1 to 2 tabs every 4 to 6 hours as needed for pain. 09/05/14   Glori LuisEric G Kileigh Ortmann, MD  meloxicam (MOBIC)  7.5 MG tablet daily. 03/11/14   Historical Provider, MD  mupirocin ointment (BACTROBAN) 2 % Apply to both nostrils TID for 1 month 08/03/14   Reuben Likesavid C Keller, MD  oxyCODONE-acetaminophen (PERCOCET) 5-325 MG per tablet 1 to 2 tablets every 6 hours as needed for pain. 08/03/14   Reuben Likesavid C Keller, MD  oxyCODONE-acetaminophen (PERCOCET) 5-325 MG per tablet 1 to 2 tablets every 6 hours as needed for pain. 08/06/14   Reuben Likesavid C Keller, MD   BP 124/82 mmHg  Pulse 68  Temp(Src) 99.3 F (37.4 C) (Oral)  Resp 12  SpO2 100% Physical Exam  Constitutional: He appears well-developed and well-nourished. No distress.  HENT:  Head: Normocephalic and atraumatic.  Cardiovascular: Normal rate, regular rhythm and normal heart sounds.   Pulmonary/Chest: Effort normal and breath sounds normal.  Genitourinary:  There are no hernias noted externally, palpable on coughing at external inguinal opening, reduce on their own  Neurological: He is alert.  Skin: Skin is warm and dry.  9 cm x 6 cm area of erythema noted in the right suprapubic region with induration on the left aspect of this lesion, no fluctuance, no drainage, the area is tender    ED Course  Procedures (including critical care time) Labs Review Labs Reviewed - No data to display  Imaging Review No results found.   MDM   1. Cellulitis of abdominal wall    Patient with cellulitis on exam. History of MRSA making this a likely cause of this infection. No  fluctuance on exam, so no indication for I&D at this time. Will treat with doxycycline 100 mg BID for 10 days as he responded to this with his last infection. Vicodin for discomfort. Given return precautions.   This patient was discussed with and seen by Dr Piedad ClimesHonig. She helped develop the full plan of care for this patient.   Marikay AlarEric Omarri Eich, MD [facility] PGY3    Glori LuisEric G Kataleyah Carducci, MD 09/05/14 [phone]

## 2014-11-05 ENCOUNTER — Ambulatory Visit (INDEPENDENT_AMBULATORY_CARE_PROVIDER_SITE_OTHER): Payer: Self-pay | Admitting: Surgery

## 2014-11-05 NOTE — H&P (Signed)
Ryan Spencer 11/05/2014 2:24 PM [facility] Patient #: [id number] DOB: 01/23/1986 Married / Language: [place] / Race: White Male History of Present Illness Ryan Spencer(Aldred Mase A. Kawika Bischoff MD; 11/05/2014 2:33 PM) Patient words: discuss BIH sx pt returns for follow up of BIH.  Ryan Spencer is a 29 y.o. male. Pt sent at the request of [name], MD for right groin pain times 1 month. Pain worse with lifting or straining. Pain radiates to left testicle . Sharp at times but has aching sensation at end of the day. Mild right groin discomfort. No dysuria.  Bilateral inguinal hernia left greater than Right reducible symptomatic.     Discussed options of open versus laparoscopic repair. Discussed the pros and cons of each as well as long term expectations of each. Given that he is a heavy smoker and does a lot of heavy lifting at work, we both felt that open repairs would give him the best ability to avoid reoperation since he is at high risk given his occupation and tobacco consumption. The risk of hernia repair include bleeding, Infection, Recurrence of the hernia, Mesh use, chronic pain, Organ injury, Bowel injury, Bladder injury, nerve injury with numbness around the incision, Death, and worsening of preexisting medical problems. The alternatives to surgery have been discussed as well.. Long term expectations of both operative and non operative treatments have been discussed. The patient agrees to proceed.  The patient is a 29 year old male   Other Problems Ryan Smart(Michelle R Spencer, [place]; 11/05/2014 2:25 PM) Inguinal Hernia  Past Surgical History Ryan Smart(Michelle R Spencer, [place]; 11/05/2014 2:25 PM) No pertinent past surgical history  Diagnostic Studies History Ryan Smart(Michelle R Spencer, [place]; 11/05/2014 2:25 PM) Colonoscopy never  Allergies Ryan Smart(Michelle R Spencer, CMA; 11/05/2014 2:25 PM) TraMADol HCl *ANALGESICS - OPIOID*  Medication History Ryan Smart(Michelle R Spencer, CMA;  11/05/2014 2:25 PM) No Current Medications  Social History Ryan Smart(Michelle R Spencer, [place]; 11/05/2014 2:25 PM) Alcohol use Occasional alcohol use. Caffeine use Carbonated beverages. No drug use Tobacco use Current every day smoker.  Family History Ryan Smart(Michelle R Spencer, [place]; 11/05/2014 2:25 PM) Alcohol Abuse Father. Arthritis Mother. Depression Father. Migraine Headache Mother.     Review of Systems [facility](Michelle R. Spencer CMA; 11/05/2014 2:25 PM) General Not Present- Appetite Loss, Chills, Fatigue, Fever, Night Sweats, Weight Gain and Weight Loss. Skin Not Present- Change in Wart/Mole, Dryness, Hives, Jaundice, New Lesions, Non-Healing Wounds, Rash and Ulcer. HEENT Not Present- Earache, Hearing Loss, Hoarseness, Nose Bleed, Oral Ulcers, Ringing in the Ears, Seasonal Allergies, Sinus Pain, Sore Throat, Visual Disturbances, Wears glasses/contact lenses and Yellow Eyes. Respiratory Not Present- Bloody sputum, Chronic Cough, Difficulty Breathing, Snoring and Wheezing. Gastrointestinal Not Present- Abdominal Pain, Bloating, Bloody Stool, Change in Bowel Habits, Chronic diarrhea, Constipation, Difficulty Swallowing, Excessive gas, Gets full quickly at meals, Hemorrhoids, Indigestion, Nausea, Rectal Pain and Vomiting. Male Genitourinary Present- Frequency. Not Present- Blood in Urine, Change in Urinary Stream, Impotence, Nocturia, Painful Urination, Urgency and Urine Leakage. Musculoskeletal Not Present- Back Pain, Joint Pain, Joint Stiffness, Muscle Pain, Muscle Weakness and Swelling of Extremities. Neurological Not Present- Decreased Memory, Fainting, Headaches, Numbness, Seizures, Tingling, Tremor, Trouble walking and Weakness. Psychiatric Not Present- Anxiety, Bipolar, Change in Sleep Pattern, Depression, Fearful and Frequent crying. Endocrine Not Present- Cold Intolerance, Excessive Hunger, Hair Changes, Heat Intolerance, Hot flashes and New Diabetes. Hematology Not Present- Easy Bruising,  Excessive bleeding, Gland problems, HIV and Persistent Infections.  [employer](Michelle R. Spencer CMA; 11/05/2014 2:25 PM) 11/05/2014 2:24 PM Weight: 156.25 lb  Height: 70in Body Surface Area: 1.87 m Body Mass Index: 22.42 kg/m BP: 132/62 (Sitting, Left Arm, Standard)     Physical Exam ([name] MD; 11/05/2014 2:34 PM)  General Mental Status-Alert. General Appearance-Consistent with stated age. Hydration-Well hydrated. Voice-Normal.  Head and Neck Head-normocephalic, atraumatic with no lesions or palpable masses. Trachea-midline.  Cardiovascular Cardiovascular examination reveals -normal heart sounds, regular rate and rhythm with no murmurs and normal pedal pulses bilaterally.  Abdomen Inspection Skin - Scar - no surgical scars. Hernias - Ventral - Reducible. Inguinal hernia - Left - Reducible. Palpation/Percussion Palpation and Percussion of the abdomen reveal - Soft, Non Tender, No Rebound tenderness, No Rigidity (guarding) and No hepatosplenomegaly. Auscultation Auscultation of the abdomen reveals - Bowel sounds normal. Note: BIH reducible   Neurologic Neurologic evaluation reveals -alert and oriented x 3 with no impairment of recent or remote memory. Mental Status-Normal.  Musculoskeletal Normal Exam - Left-Upper Extremity Strength Normal and Lower Extremity Strength Normal. Normal Exam - Right-Upper Extremity Strength Normal, Lower Extremity Weakness.    Assessment & Plan ([name] MD; 11/05/2014 2:35 PM)  BILATERAL INGUINAL HERNIA WITHOUT OBSTRUCTION OR GANGRENE, RECURRENCE NOT SPECIFIED (550.92  K40.20) Impression: Bilateral inguinal hernia left greater than Right reducible symptomatic.     Discussed options of open versus laparoscopic repair. Discussed the pros and cons of each as well as long term expectations of each. Given that he is a heavy smoker and does a lot of heavy lifting at work, we both felt that  open repairs would give him the best ability to avoid reoperation since he is at high risk given his occupation and tobacco consumption. The risk of hernia repair include bleeding, Infection, Recurrence of the hernia, Mesh use, chronic pain, Organ injury, Bowel injury, Bladder injury, nerve injury with numbness around the incision, Death, and worsening of preexisting medical problems. The alternatives to surgery have been discussed as well.. Long term expectations of both operative and non operative treatments have been discussed.  The patient agrees to proceed.  Current Plans The anatomy and the physiology was discussed. The pathophysiology and natural history of the disease was discussed. Options were discussed and recommendations were made. Technique, risks, benefits, & alternatives were discussed. Risks such as stroke, heart attack, bleeding, indection, death, and other risks discussed. Questions answered. The patient agrees to proceed. Pt Education - [facility] Umbilical/ Inguinal Hernia HCI

## 2014-11-15 ENCOUNTER — Encounter (HOSPITAL_COMMUNITY)
Admission: RE | Admit: 2014-11-15 | Discharge: 2014-11-15 | Disposition: A | Discharge status: Home / Self Care | Payer: BLUE CROSS/BLUE SHIELD | Source: Ambulatory Visit | Attending: Surgery | Admitting: Surgery

## 2014-11-15 ENCOUNTER — Encounter (HOSPITAL_COMMUNITY): Payer: Self-pay

## 2014-11-15 DIAGNOSIS — Z01818 Encounter for other preprocedural examination: Secondary | ICD-10-CM | POA: Diagnosis present

## 2014-11-15 DIAGNOSIS — K4090 Unilateral inguinal hernia, without obstruction or gangrene, not specified as recurrent: Secondary | ICD-10-CM | POA: Diagnosis not present

## 2014-11-15 HISTORY — DX: Headache: R51

## 2014-11-15 HISTORY — DX: Headache, unspecified: R51.9

## 2014-11-15 HISTORY — DX: Anxiety disorder, unspecified: F41.9

## 2014-11-15 LAB — COMPREHENSIVE METABOLIC PANEL
ALBUMIN: 4.2 g/dL (ref 3.5–5.2)
ALT: 14 U/L (ref 0–53)
AST: 22 U/L (ref 0–37)
Alkaline Phosphatase: 71 U/L (ref 39–117)
Anion gap: 6 (ref 5–15)
BUN: 7 mg/dL (ref 6–23)
CO2: 29 mmol/L (ref 19–32)
CREATININE: 0.83 mg/dL (ref 0.50–1.35)
Calcium: 9.3 mg/dL (ref 8.4–10.5)
Chloride: 105 mEq/L (ref 96–112)
GFR calc Af Amer: >90 mL/min (ref >90)
GFR calc non Af Amer: >90 mL/min (ref >90)
Glucose, Bld: 90 mg/dL (ref 70–99)
POTASSIUM: 4.2 mmol/L (ref 3.5–5.1)
Sodium: 140 mmol/L (ref 135–145)
TOTAL PROTEIN: 6.5 g/dL (ref 6.0–8.3)
Total Bilirubin: 0.5 mg/dL (ref 0.3–1.2)

## 2014-11-15 LAB — CBC WITH DIFFERENTIAL/PLATELET
Basophils Absolute: 0 10*3/uL (ref 0.0–0.1)
Basophils Relative: 1 % (ref 0–1)
EOS PCT: 4 % (ref 0–5)
Eosinophils Absolute: 0.3 10*3/uL (ref 0.0–0.7)
HCT: 40.1 % (ref 39.0–52.0)
Hemoglobin: 13.7 g/dL (ref 13.0–17.0)
Lymphocytes Relative: 44 % (ref 12–46)
Lymphs Abs: 2.8 10*3/uL (ref 0.7–4.0)
MCH: 31.2 pg (ref 26.0–34.0)
MCHC: 34.2 g/dL (ref 30.0–36.0)
MCV: 91.3 fL (ref 78.0–100.0)
Monocytes Absolute: 0.5 10*3/uL (ref 0.1–1.0)
Monocytes Relative: 8 % (ref 3–12)
Neutro Abs: 2.7 10*3/uL (ref 1.7–7.7)
Neutrophils Relative %: 43 % (ref 43–77)
Platelets: 236 10*3/uL (ref 150–400)
RBC: 4.39 MIL/uL (ref 4.22–5.81)
RDW: 12.7 % (ref 11.5–15.5)
WBC: 6.2 10*3/uL (ref 4.0–10.5)

## 2014-11-15 LAB — SURGICAL PCR SCREEN
MRSA, PCR: POSITIVE — AB
Staphylococcus aureus: POSITIVE — AB

## 2014-11-15 NOTE — Progress Notes (Signed)
[facility] called a prescription [facility] [address], [place]

## 2014-11-15 NOTE — Pre-Procedure Instructions (Signed)
Ryan HurtMichael V Spencer  11/15/2014   Your procedure is scheduled on:  11/22/2014  Report [facility] Admitting entrance   A   at 1   Pm IN THE AFTERNOON  Call this number if you have problems the morning of surgery: [phone]   Remember:   Do not eat food or drink liquids after midnight.  ON wednesday   Take these medicines the morning of surgery with A SIP OF WATER: NOTHING, UNLESS YOU NEED PAIN MEDICINE   Do not wear jewelry  Do not wear lotions, powders, or perfumes. You may wear deodorant.             Men may shave face and neck.  Do not bring valuables to the hospital.  [facility] is not responsible  for any belongings or valuables.               Contacts, dentures or bridgework may not be worn into surgery.   Leave suitcase in the car. After surgery it may be brought to your room.   For patients admitted to the hospital, discharge time is determined by your   treatment team.               Patients discharged the day of surgery will not be allowed to drive  home.  Name and phone number of your driver: WITH FAMILY  Special Instructions: Special Instructions: [place] - Preparing for Surgery  Before surgery, you can play an important role.  Because skin is not sterile, your skin needs to be as free of germs as possible.  You can reduce the number of germs on you skin by washing with CHG (chlorahexidine gluconate) soap before surgery.  CHG is an antiseptic cleaner which kills germs and bonds with the skin to continue killing germs even after washing.  Please DO NOT use if you have an allergy to CHG or antibacterial soaps.  If your skin becomes reddened/irritated stop using the CHG and inform your nurse when you arrive at Short Stay.  Do not shave (including legs and underarms) for at least 48 hours prior to the first CHG shower.  You may shave your face.  Please follow these instructions carefully:   1.  Shower with CHG Soap the night before surgery and the  morning  of Surgery.  2.  If you choose to wash your hair, wash your hair first as usual with your  normal shampoo.  3.  After you shampoo, rinse your hair and body thoroughly to remove the  Shampoo.  4.  Use CHG as you would any other liquid soap.  You can apply chg directly to the skin and wash gently with scrungie or a clean washcloth.  5.  Apply the CHG Soap to your body ONLY FROM THE NECK DOWN.    Do not use on open wounds or open sores.  Avoid contact with your eyes, ears, mouth and genitals (private parts).  Wash genitals (private parts)   with your normal soap.  6.  Wash thoroughly, paying special attention to the area where your surgery will be performed.  7.  Thoroughly rinse your body with warm water from the neck down.  8.  DO NOT shower/wash with your normal soap after using and rinsing off   the CHG Soap.  9.  Pat yourself dry with a clean towel.            10.  Wear clean pajamas.  11.  Place clean sheets on your bed the night of your first shower and do not sleep with pets.  Day of Surgery  Do not apply any lotions/deodorants the morning of surgery.  Please wear clean clothes to the hospital/surgery center.   Please read over the following fact sheets that you were given: Pain Booklet, Coughing and Deep Breathing, MRSA Information and Surgical Site Infection Prevention

## 2014-11-21 MED ORDER — CEFAZOLIN SODIUM-DEXTROSE 2-3 GM-% IV SOLR
2.0000 g | INTRAVENOUS | Status: AC
  Administered 2014-11-22: 2 g via INTRAVENOUS
  Filled 2014-11-21: qty 50

## 2014-11-21 NOTE — Progress Notes (Signed)
Notified patient of time change and instructed him to arrive at 900 am 11/22/14.

## 2014-11-22 ENCOUNTER — Ambulatory Visit (HOSPITAL_BASED_OUTPATIENT_CLINIC_OR_DEPARTMENT_OTHER)
Admission: RE | Admit: 2014-11-22 | Discharge: 2014-11-22 | Disposition: A | Discharge status: Home / Self Care | Payer: BLUE CROSS/BLUE SHIELD | Source: Ambulatory Visit | Attending: Surgery | Admitting: Surgery

## 2014-11-22 ENCOUNTER — Ambulatory Visit (HOSPITAL_COMMUNITY): Payer: BLUE CROSS/BLUE SHIELD | Admitting: Anesthesiology

## 2014-11-22 ENCOUNTER — Encounter (HOSPITAL_COMMUNITY)
Admission: RE | Disposition: A | Discharge status: Home / Self Care | Payer: Self-pay | Source: Ambulatory Visit | Attending: Surgery

## 2014-11-22 DIAGNOSIS — K4020 Bilateral inguinal hernia, without obstruction or gangrene, not specified as recurrent: Secondary | ICD-10-CM | POA: Insufficient documentation

## 2014-11-22 DIAGNOSIS — F17210 Nicotine dependence, cigarettes, uncomplicated: Secondary | ICD-10-CM | POA: Insufficient documentation

## 2014-11-22 DIAGNOSIS — G8929 Other chronic pain: Secondary | ICD-10-CM | POA: Insufficient documentation

## 2014-11-22 HISTORY — PX: INSERTION OF MESH: SHX5868

## 2014-11-22 HISTORY — PX: INGUINAL HERNIA REPAIR: SHX194

## 2014-11-22 SURGERY — REPAIR, HERNIA, INGUINAL, BILATERAL, ADULT
Anesthesia: General | Site: Groin | Laterality: Bilateral

## 2014-11-22 MED ORDER — CHLORHEXIDINE GLUCONATE 4 % EX LIQD
1.0000 "application " | Freq: Once | CUTANEOUS | Status: DC
  Filled 2014-11-22: qty 15

## 2014-11-22 MED ORDER — ONDANSETRON HCL 4 MG/2ML IJ SOLN
INTRAMUSCULAR | Status: DC | PRN
  Administered 2014-11-22: 4 mg via INTRAVENOUS

## 2014-11-22 MED ORDER — HYDROMORPHONE HCL 1 MG/ML IJ SOLN
INTRAMUSCULAR | Status: AC
  Filled 2014-11-22: qty 1

## 2014-11-22 MED ORDER — 0.9 % SODIUM CHLORIDE (POUR BTL) OPTIME
TOPICAL | Status: DC | PRN
  Administered 2014-11-22: 1000 mL

## 2014-11-22 MED ORDER — ACETAMINOPHEN 10 MG/ML IV SOLN
INTRAVENOUS | Status: AC
  Filled 2014-11-22: qty 100

## 2014-11-22 MED ORDER — VANCOMYCIN HCL IN DEXTROSE 1-5 GM/200ML-% IV SOLN: 1000.0000 mg | Freq: Once | INTRAVENOUS | Status: DC

## 2014-11-22 MED ORDER — SUCCINYLCHOLINE CHLORIDE 20 MG/ML IJ SOLN
INTRAMUSCULAR | Status: DC | PRN
  Administered 2014-11-22: 100 mg via INTRAVENOUS

## 2014-11-22 MED ORDER — VANCOMYCIN HCL IN DEXTROSE 1-5 GM/200ML-% IV SOLN
INTRAVENOUS | Status: AC
  Administered 2014-11-22: 1000 mg via INTRAVENOUS
  Filled 2014-11-22: qty 200

## 2014-11-22 MED ORDER — LIDOCAINE HCL (CARDIAC) 20 MG/ML IV SOLN
INTRAVENOUS | Status: DC | PRN
  Administered 2014-11-22: 80 mg via INTRAVENOUS

## 2014-11-22 MED ORDER — ACETAMINOPHEN 10 MG/ML IV SOLN
INTRAVENOUS | Status: DC | PRN
  Administered 2014-11-22: 1000 mg via INTRAVENOUS

## 2014-11-22 MED ORDER — MIDAZOLAM HCL 5 MG/5ML IJ SOLN
INTRAMUSCULAR | Status: DC | PRN
  Administered 2014-11-22: 2 mg via INTRAVENOUS

## 2014-11-22 MED ORDER — LACTATED RINGERS IV SOLN
INTRAVENOUS | Status: DC
  Administered 2014-11-22: 10:00:00 via INTRAVENOUS

## 2014-11-22 MED ORDER — MEPERIDINE HCL 25 MG/ML IJ SOLN: 6.2500 mg | INTRAMUSCULAR | Status: DC | PRN

## 2014-11-22 MED ORDER — OXYCODONE HCL 5 MG PO TABS
5.0000 mg | ORAL_TABLET | Freq: Once | ORAL | Status: AC
  Administered 2014-11-22: 5 mg via ORAL

## 2014-11-22 MED ORDER — OXYCODONE-ACETAMINOPHEN 5-325 MG PO TABS: 1.0000 | ORAL_TABLET | ORAL | Status: DC | PRN

## 2014-11-22 MED ORDER — FENTANYL CITRATE 0.05 MG/ML IJ SOLN
INTRAMUSCULAR | Status: DC | PRN
  Administered 2014-11-22 (×2): 100 ug via INTRAVENOUS
  Administered 2014-11-22: 50 ug via INTRAVENOUS

## 2014-11-22 MED ORDER — VANCOMYCIN HCL IN DEXTROSE 1-5 GM/200ML-% IV SOLN: 1000.0000 mg | INTRAVENOUS | Status: DC

## 2014-11-22 MED ORDER — PROMETHAZINE HCL 25 MG/ML IJ SOLN
6.2500 mg | INTRAMUSCULAR | Status: DC | PRN
Start: 2014-11-22 — End: 2014-11-22

## 2014-11-22 MED ORDER — PROPOFOL 10 MG/ML IV BOLUS
INTRAVENOUS | Status: DC | PRN
  Administered 2014-11-22: 150 mg via INTRAVENOUS

## 2014-11-22 MED ORDER — FENTANYL CITRATE 0.05 MG/ML IJ SOLN
INTRAMUSCULAR | Status: AC
  Filled 2014-11-22: qty 2

## 2014-11-22 MED ORDER — BUPIVACAINE-EPINEPHRINE 0.25% -1:200000 IJ SOLN
INTRAMUSCULAR | Status: DC | PRN
  Administered 2014-11-22: 30 mL

## 2014-11-22 MED ORDER — FENTANYL CITRATE 0.05 MG/ML IJ SOLN
25.0000 ug | INTRAMUSCULAR | Status: DC | PRN
  Administered 2014-11-22 (×3): 50 ug via INTRAVENOUS

## 2014-11-22 MED ORDER — OXYCODONE HCL 5 MG PO TABS
ORAL_TABLET | ORAL | Status: AC
  Filled 2014-11-22: qty 1

## 2014-11-22 MED ORDER — HYDROMORPHONE HCL 1 MG/ML IJ SOLN
0.5000 mg | INTRAMUSCULAR | Status: DC | PRN
  Administered 2014-11-22 (×2): 0.5 mg via INTRAVENOUS

## 2014-11-22 MED ORDER — LACTATED RINGERS IV SOLN
INTRAVENOUS | Status: DC | PRN
  Administered 2014-11-22 (×2): via INTRAVENOUS

## 2014-11-22 SURGICAL SUPPLY — 44 items
BLADE SURG ROTATE 9660 (MISCELLANEOUS) ×3 IMPLANT
CANISTER SUCTION 2500CC (MISCELLANEOUS) ×3 IMPLANT
CHLORAPREP W/TINT 26ML (MISCELLANEOUS) ×3 IMPLANT
COVER SURGICAL LIGHT HANDLE (MISCELLANEOUS) ×3 IMPLANT
DRAIN PENROSE 1/2X12 LTX STRL (WOUND CARE) ×3 IMPLANT
DRAPE LAPAROTOMY TRNSV 102X78 (DRAPE) ×3 IMPLANT
DRAPE UTILITY XL STRL (DRAPES) ×6 IMPLANT
ELECT CAUTERY BLADE 6.4 (BLADE) ×3 IMPLANT
ELECT REM PT RETURN 9FT ADLT (ELECTROSURGICAL) ×3
ELECTRODE REM PT RTRN 9FT ADLT (ELECTROSURGICAL) ×1 IMPLANT
GLOVE BIO SURGEON STRL SZ8 (GLOVE) ×3 IMPLANT
GLOVE BIOGEL PI IND STRL 8 (GLOVE) ×1 IMPLANT
GLOVE BIOGEL PI INDICATOR 8 (GLOVE) ×2
GOWN STRL REUS W/ TWL LRG LVL3 (GOWN DISPOSABLE) ×2 IMPLANT
GOWN STRL REUS W/ TWL XL LVL3 (GOWN DISPOSABLE) ×1 IMPLANT
GOWN STRL REUS W/TWL LRG LVL3 (GOWN DISPOSABLE) ×4
GOWN STRL REUS W/TWL XL LVL3 (GOWN DISPOSABLE) ×2
KIT BASIN OR (CUSTOM PROCEDURE TRAY) ×3 IMPLANT
KIT ROOM TURNOVER OR (KITS) ×3 IMPLANT
LIQUID BAND (GAUZE/BANDAGES/DRESSINGS) ×3 IMPLANT
MESH HERNIA SYS ULTRAPRO LRG (Mesh General) ×6 IMPLANT
NEEDLE HYPO 25GX1X1/2 BEV (NEEDLE) ×3 IMPLANT
NS IRRIG 1000ML POUR BTL (IV SOLUTION) ×3 IMPLANT
PACK SURGICAL SETUP 50X90 (CUSTOM PROCEDURE TRAY) ×3 IMPLANT
PAD ARMBOARD 7.5X6 YLW CONV (MISCELLANEOUS) ×3 IMPLANT
PENCIL BUTTON HOLSTER BLD 10FT (ELECTRODE) ×3 IMPLANT
SPONGE LAP 18X18 X RAY DECT (DISPOSABLE) ×3 IMPLANT
SUT MNCRL AB 4-0 PS2 18 (SUTURE) ×6 IMPLANT
SUT NOVA 0 T19/GS 22DT (SUTURE) ×18 IMPLANT
SUT NOVA NAB DX-16 0-1 5-0 T12 (SUTURE) ×6 IMPLANT
SUT NOVA NAB GS-22 2 0 T19 (SUTURE) ×18 IMPLANT
SUT VIC AB 0 CT1 27 (SUTURE)
SUT VIC AB 0 CT1 27XBRD ANBCTR (SUTURE) IMPLANT
SUT VIC AB 2-0 SH 27 (SUTURE) ×4
SUT VIC AB 2-0 SH 27X BRD (SUTURE) ×2 IMPLANT
SUT VIC AB 3-0 SH 18 (SUTURE) ×6 IMPLANT
SUT VICRYL AB 3 0 TIES (SUTURE) ×3 IMPLANT
SYR BULB 3OZ (MISCELLANEOUS) ×3 IMPLANT
SYR CONTROL 10ML LL (SYRINGE) ×3 IMPLANT
TOWEL OR 17X24 6PK STRL BLUE (TOWEL DISPOSABLE) ×3 IMPLANT
TOWEL OR 17X26 10 PK STRL BLUE (TOWEL DISPOSABLE) ×3 IMPLANT
TUBE CONNECTING 12'X1/4 (SUCTIONS)
TUBE CONNECTING 12X1/4 (SUCTIONS) IMPLANT
YANKAUER SUCT BULB TIP NO VENT (SUCTIONS) IMPLANT

## 2014-11-22 NOTE — Anesthesia Preprocedure Evaluation (Addendum)
Anesthesia Evaluation  Patient identified by MRN, date of birth, ID band Patient awake    Reviewed: Allergy & Precautions, NPO status , Patient's Chart, lab work & pertinent test results, reviewed documented beta blocker date and time   Airway Mallampati: II   Neck ROM: Full    Dental  (+) Teeth Intact, Dental Advisory Given   Pulmonary neg pulmonary ROS, Current Smoker,  breath sounds clear to auscultation        Cardiovascular negative cardio ROS  Rhythm:Regular     Neuro/Psych Anxiety    GI/Hepatic negative GI ROS, Neg liver ROS,   Endo/Other  negative endocrine ROS  Renal/GU negative Renal ROS     Musculoskeletal   Abdominal (+)  Abdomen: soft.    Peds  Hematology 13/40 H/H   Anesthesia Other Findings Facial hair  Reproductive/Obstetrics                            Anesthesia Physical Anesthesia Plan  ASA: II  Anesthesia Plan: General   Post-op Pain Management:    Induction: Intravenous  Airway Management Planned: Oral ETT  Additional Equipment:   Intra-op Plan:   Post-operative Plan: Extubation in OR  Informed Consent: I have reviewed the patients History and Physical, chart, labs and discussed the procedure including the risks, benefits and alternatives for the proposed anesthesia with the patient or authorized representative who has indicated his/her understanding and acceptance.     Plan Discussed with:   Anesthesia Plan Comments:         Anesthesia Quick Evaluation

## 2014-11-22 NOTE — Interval H&P Note (Signed)
History and Physical Interval Note:  11/22/2014 8:08 AM  [name]  has presented today for surgery, with the diagnosis of Bilateral Inguinal Hernias  The various methods of treatment have been discussed with the patient and family. After consideration of risks, benefits and other options for treatment, the patient has consented to  Procedure(s): BILATERAL OPEN INGUINAL HERNIA REPAIR WITH MESH  (Bilateral) INSERTION OF MESH (Bilateral) as a surgical intervention .  The patient's history has been reviewed, patient examined, no change in status, stable for surgery.  I have reviewed the patient's chart and labs.  Questions were answered to the patient's satisfaction.     Arlayne Liggins A.

## 2014-11-22 NOTE — Anesthesia Postprocedure Evaluation (Signed)
  Anesthesia Post-op Note  Patient: Ryan HurtMichael V Spencer  Procedure(s) Performed: Procedure(s): BILATERAL OPEN INGUINAL HERNIA REPAIR WITH MESH  (Bilateral) INSERTION OF MESH (Bilateral)  Patient Location: PACU  Anesthesia Type:General  Level of Consciousness: awake and alert   Airway and Oxygen Therapy: Patient Spontanous Breathing and Patient connected to nasal cannula oxygen  Post-op Pain: mild  Post-op Assessment: Post-op Vital signs reviewed  Post-op Vital Signs: Reviewed and stable  Last Vitals:  Filed Vitals:   11/22/14 1330  BP: 123/52  Pulse: 65  Temp: 36.6 C  Resp: 16    Complications: No apparent anesthesia complications

## 2014-11-22 NOTE — Discharge Instructions (Signed)
[facility] _______[facility] ° °UMBILICAL OR INGUINAL HERNIA REPAIR: POST OP INSTRUCTIONS ° °Always review your discharge instruction sheet given to you by the facility where your surgery was performed. °IF YOU HAVE DISABILITY OR FAMILY LEAVE FORMS, YOU MUST BRING THEM TO THE OFFICE FOR PROCESSING.   °DO NOT GIVE THEM TO YOUR DOCTOR. ° °1. A  prescription for pain medication may be given to you upon discharge.  Take your pain medication as prescribed, if needed.  If narcotic pain medicine is not needed, then you may take acetaminophen (Tylenol) or ibuprofen (Advil) as needed. °2. Take your usually prescribed medications unless otherwise directed. °3. If you need a refill on your pain medication, please contact your pharmacy.  They will contact our office to request authorization. Prescriptions will not be filled after 5 pm or on week-ends. °4. You should follow a light diet the first 24 hours after arrival home, such as soup and crackers, etc.  Be sure to include lots of fluids daily.  Resume your normal diet the day after surgery. °5. Most patients will experience some swelling and bruising around the umbilicus or in the groin and scrotum.  Ice packs and reclining will help.  Swelling and bruising can take several days to resolve.  °6. It is common to experience some constipation if taking pain medication after surgery.  Increasing fluid intake and taking a stool softener (such as Colace) will usually help or prevent this problem from occurring.  A mild laxative (Milk of Magnesia or Miralax) should be taken according to package directions if there are no bowel movements after 48 hours. °7. Unless discharge instructions indicate otherwise, you may remove your bandages 24-48 hours after surgery, and you may shower at that time.  You may have steri-strips (small skin tapes) in place directly over the incision.  These strips should be left on the skin for 7-10 days.  If your surgeon used skin glue on the  incision, you may shower in 24 hours.  The glue will flake off over the next 2-3 weeks.  Any sutures or staples will be removed at the office during your follow-up visit. °8. ACTIVITIES:  You may resume regular (light) daily activities beginning the next day--such as daily self-care, walking, climbing stairs--gradually increasing activities as tolerated.  You may have sexual intercourse when it is comfortable.  Refrain from any heavy lifting or straining until approved by your doctor. °a. You may drive when you are no longer taking prescription pain medication, you can comfortably wear a seatbelt, and you can safely maneuver your car and apply brakes. °b. RETURN TO WORK:  __________________________________________________________ °9. You should see your doctor in the office for a follow-up appointment approximately 2-3 weeks after your surgery.  Make sure that you call for this appointment within a day or two after you arrive home to insure a convenient appointment time. °10. OTHER INSTRUCTIONS:  __________________________________________________________________________________________________________________________________________________________________________________________  °WHEN TO CALL YOUR DOCTOR: °1. Fever over 101.0 °2. Inability to urinate °3. Nausea and/or vomiting °4. Extreme swelling or bruising °5. Continued bleeding from incision. °6. Increased pain, redness, or drainage from the incision ° °The clinic staff is available to answer your questions during regular business hours.  Please don’t hesitate to call and ask to speak to one of the nurses for clinical concerns.  If you have a medical emergency, go to the nearest emergency room or call 911.  A surgeon from [facility] is always on call at the hospital ° ° °  [address], [address], [place], [place]  [address] ? ° [address] [address], [place], [place]   [address] °[phone] ? [phone] ? FAX [phone] °Web site:  [email or web address] ° ° °General Anesthesia, Adult, Care After  °Refer to this sheet in the next few weeks. These instructions provide you with information on caring for yourself after your procedure. Your health care provider may also give you more specific instructions. Your treatment has been planned according to current medical practices, but problems sometimes occur. Call your health care provider if you have any problems or questions after your procedure.  °WHAT TO EXPECT AFTER THE PROCEDURE  °After the procedure, it is typical to experience:  °Sleepiness.  °Nausea and vomiting. °HOME CARE INSTRUCTIONS  °For the first 24 hours after general anesthesia:  °Have a responsible person with you.  °Do not drive a car. If you are alone, do not take public transportation.  °Do not drink alcohol.  °Do not take medicine that has not been prescribed by your health care provider.  °Do not sign important papers or make important decisions.  °You may resume a normal diet and activities as directed by your health care provider.  °Change bandages (dressings) as directed.  °If you have questions or problems that seem related to general anesthesia, call the hospital and ask for the anesthetist or anesthesiologist on call. °SEEK MEDICAL CARE IF:  °You have nausea and vomiting that continue the day after anesthesia.  °You develop a rash. °SEEK IMMEDIATE MEDICAL CARE IF:  °You have difficulty breathing.  °You have chest pain.  °You have any allergic problems. °Document Released: 01/18/2001 Document Revised: 06/14/2013 Document Reviewed: 04/27/2013  °ExitCare® Patient Information ©2014 [employer].  ° ° ° °

## 2014-11-22 NOTE — Interval H&P Note (Signed)
History and Physical Interval Note:  11/22/2014 10:55 AM  [name]  has presented today for surgery, with the diagnosis of Bilateral Inguinal Hernias  The various methods of treatment have been discussed with the patient and family. After consideration of risks, benefits and other options for treatment, the patient has consented to  Procedure(s): BILATERAL OPEN INGUINAL HERNIA REPAIR WITH MESH  (Bilateral) INSERTION OF MESH (Bilateral) as a surgical intervention .  The patient's history has been reviewed, patient examined, no change in status, stable for surgery.  I have reviewed the patient's chart and labs.  Questions were answered to the patient's satisfaction.     Freddrick Gladson A.

## 2014-11-22 NOTE — Progress Notes (Signed)
Patient and family noted small (nickle sized) raised area on left arm.  Area is white in color, slightly raised.  No pain or itching at area per patient.  Notified Autumn PattyEdmond Fitzgerald MD.  Ordered to monitor patient for more raised areas.

## 2014-11-22 NOTE — H&P (View-Only) (Signed)
Ryan Spencer 11/05/2014 2:24 PM Location: [facility] Patient #: [id number] DOB: 08/14/1986 Married / Language: English / Race: White Male History of Present Illness ([name] [name] MD; 11/05/2014 2:33 PM) Patient words: discuss BIH sx pt returns for follow up of BIH.  Ryan Spencer is a 29 y.o. male. Pt sent at the request of [name], MD for right groin pain times 1 month. Pain worse with lifting or straining. Pain radiates to left testicle . Sharp at times but has aching sensation at end of the day. Mild right groin discomfort. No dysuria.  Bilateral inguinal hernia left greater than Right reducible symptomatic.     Discussed options of open versus laparoscopic repair. Discussed the pros and cons of each as well as long term expectations of each. Given that he is a heavy smoker and does a lot of heavy lifting at work, we both felt that open repairs would give him the best ability to avoid reoperation since he is at high risk given his occupation and tobacco consumption. The risk of hernia repair include bleeding, Infection, Recurrence of the hernia, Mesh use, chronic pain, Organ injury, Bowel injury, Bladder injury, nerve injury with numbness around the incision, Death, and worsening of preexisting medical problems. The alternatives to surgery have been discussed as well.. Long term expectations of both operative and non operative treatments have been discussed. The patient agrees to proceed.  The patient is a 28 year old male   Other Problems ([name], CMA; 11/05/2014 2:25 PM) Inguinal Hernia  Past Surgical History ([name], CMA; 11/05/2014 2:25 PM) No pertinent past surgical history  Diagnostic Studies History ([name], CMA; 11/05/2014 2:25 PM) Colonoscopy never  Allergies ([name], CMA; 11/05/2014 2:25 PM) TraMADol HCl *ANALGESICS - OPIOID*  Medication History ([name], CMA;  11/05/2014 2:25 PM) No Current Medications  Social History ([name], CMA; 11/05/2014 2:25 PM) Alcohol use Occasional alcohol use. Caffeine use Carbonated beverages. No drug use Tobacco use Current every day smoker.  Family History ([name], CMA; 11/05/2014 2:25 PM) Alcohol Abuse Father. Arthritis Mother. Depression Father. Migraine Headache Mother.     Review of Systems ([name]; 11/05/2014 2:25 PM) General Not Present- Appetite Loss, Chills, Fatigue, Fever, Night Sweats, Weight Gain and Weight Loss. Skin Not Present- Change in Wart/Mole, Dryness, Hives, Jaundice, New Lesions, Non-Healing Wounds, Rash and Ulcer. HEENT Not Present- Earache, Hearing Loss, Hoarseness, Nose Bleed, Oral Ulcers, Ringing in the Ears, Seasonal Allergies, Sinus Pain, Sore Throat, Visual Disturbances, Wears glasses/contact lenses and Yellow Eyes. Respiratory Not Present- Bloody sputum, Chronic Cough, Difficulty Breathing, Snoring and Wheezing. Gastrointestinal Not Present- Abdominal Pain, Bloating, Bloody Stool, Change in Bowel Habits, Chronic diarrhea, Constipation, Difficulty Swallowing, Excessive gas, Gets full quickly at meals, Hemorrhoids, Indigestion, Nausea, Rectal Pain and Vomiting. Male Genitourinary Present- Frequency. Not Present- Blood in Urine, Change in Urinary Stream, Impotence, Nocturia, Painful Urination, Urgency and Urine Leakage. Musculoskeletal Not Present- Back Pain, Joint Pain, Joint Stiffness, Muscle Pain, Muscle Weakness and Swelling of Extremities. Neurological Not Present- Decreased Memory, Fainting, Headaches, Numbness, Seizures, Tingling, Tremor, Trouble walking and Weakness. Psychiatric Not Present- Anxiety, Bipolar, Change in Sleep Pattern, Depression, Fearful and Frequent crying. Endocrine Not Present- Cold Intolerance, Excessive Hunger, Hair Changes, Heat Intolerance, Hot flashes and New Diabetes. Hematology Not Present- Easy Bruising,  Excessive bleeding, Gland problems, HIV and Persistent Infections.  Vitals ([name]; 11/05/2014 2:25 PM) 11/05/2014 2:24 PM Weight: 156.25 lb   Height: 70in Body Surface Area: 1.87 m Body Mass Index: 22.42 kg/m BP: 132/62 (Sitting, Left Arm, Standard)     Physical Exam ([name] [name] MD; 11/05/2014 2:34 PM)  General Mental Status-Alert. General Appearance-Consistent with stated age. Hydration-Well hydrated. Voice-Normal.  Head and Neck Head-normocephalic, atraumatic with no lesions or palpable masses. Trachea-midline.  Cardiovascular Cardiovascular examination reveals -normal heart sounds, regular rate and rhythm with no murmurs and normal pedal pulses bilaterally.  Abdomen Inspection Skin - Scar - no surgical scars. Hernias - Ventral - Reducible. Inguinal hernia - Left - Reducible. Palpation/Percussion Palpation and Percussion of the abdomen reveal - Soft, Non Tender, No Rebound tenderness, No Rigidity (guarding) and No hepatosplenomegaly. Auscultation Auscultation of the abdomen reveals - Bowel sounds normal. Note: BIH reducible   Neurologic Neurologic evaluation reveals -alert and oriented x 3 with no impairment of recent or remote memory. Mental Status-Normal.  Musculoskeletal Normal Exam - Left-Upper Extremity Strength Normal and Lower Extremity Strength Normal. Normal Exam - Right-Upper Extremity Strength Normal, Lower Extremity Weakness.    Assessment & Plan ([name] A. [name] MD; 11/05/2014 2:35 PM)  BILATERAL INGUINAL HERNIA WITHOUT OBSTRUCTION OR GANGRENE, RECURRENCE NOT SPECIFIED (550.92  K40.20) Impression: Bilateral inguinal hernia left greater than Right reducible symptomatic.     Discussed options of open versus laparoscopic repair. Discussed the pros and cons of each as well as long term expectations of each. Given that he is a heavy smoker and does a lot of heavy lifting at work, we both felt that  open repairs would give him the best ability to avoid reoperation since he is at high risk given his occupation and tobacco consumption. The risk of hernia repair include bleeding, Infection, Recurrence of the hernia, Mesh use, chronic pain, Organ injury, Bowel injury, Bladder injury, nerve injury with numbness around the incision, Death, and worsening of preexisting medical problems. The alternatives to surgery have been discussed as well.. Long term expectations of both operative and non operative treatments have been discussed.  The patient agrees to proceed.  Current Plans The anatomy and the physiology was discussed. The pathophysiology and natural history of the disease was discussed. Options were discussed and recommendations were made. Technique, risks, benefits, & alternatives were discussed. Risks such as stroke, heart attack, bleeding, indection, death, and other risks discussed. Questions answered. The patient agrees to proceed. Pt Education - [facility] Umbilical/ Inguinal Hernia HCI 

## 2014-11-22 NOTE — Anesthesia Procedure Notes (Signed)
Procedure Name: Intubation Date/Time: 11/22/2014 11:36 AM Performed by: Orvilla FusATO, Jetty Berland A Pre-anesthesia Checklist: Patient identified, Timeout performed, Emergency Drugs available, Suction available and Patient being monitored Patient Re-evaluated:Patient Re-evaluated prior to inductionOxygen Delivery Method: Circle system utilized Intubation Type: IV induction Ventilation: Mask ventilation without difficulty Laryngoscope Size: Mac and 3 Grade View: Grade I Tube type: Oral Tube size: 7.5 mm Number of attempts: 1 Airway Equipment and Method: Stylet Placement Confirmation: ETT inserted through vocal cords under direct vision,  positive ETCO2 and breath sounds checked- equal and bilateral Secured at: 23 cm Tube secured with: Tape Dental Injury: Teeth and Oropharynx as per pre-operative assessment

## 2014-11-22 NOTE — Transfer of Care (Signed)
Immediate Anesthesia Transfer of Care Note  Patient: Ryan Spencer  Procedure(s) Performed: Procedure(s): BILATERAL OPEN INGUINAL HERNIA REPAIR WITH MESH  (Bilateral) INSERTION OF MESH (Bilateral)  Patient Location: PACU  Anesthesia Type:General  Level of Consciousness: awake, alert , oriented and patient cooperative  Airway & Oxygen Therapy: Patient Spontanous Breathing and Patient connected to nasal cannula oxygen  Post-op Assessment: Report given to PACU RN, Post -op Vital signs reviewed and stable and Patient moving all extremities  Post vital signs: Reviewed and stable  Last Vitals:  Filed Vitals:   11/22/14 0913  BP: 94/50  Pulse: 71  Temp: 36.7 C  Resp: 16    Complications: No apparent anesthesia complications

## 2014-11-22 NOTE — Op Note (Signed)
Bilateral Inguinal Hernia repair , Open, Procedure Note with [facility] mesh  Indications: The patient presented with a history of a bilateral, reducible inguinal  hernia.  Laparoscopic and open repairs discussed.  Pt decided on open repair.  The risk of hernia repair include bleeding,  Infection,   Recurrence of the hernia,  Mesh use, chronic pain,  Organ injury,  Bowel injury,  Bladder injury,   nerve injury with numbness around the incision,  Death,  and worsening of preexisting  medical problems.  The alternatives to surgery have been discussed as well..  Long term expectations of both operative and non operative treatments have been discussed.   The patient agrees to proceed.   Pre-operative Diagnosis: bilateral reducible inguinal hernia  Post-operative Diagnosis: same  Surgeon: Harriette BouillonORNETT,Leiani Enright A.   Assistants: OR staff  Anesthesia: General endotracheal anesthesia and Local anesthesia 0.25.% bupivacaine, with epinephrine  ASA Class: 2  Procedure Details  The patient was seen again in the Holding Room. The risks, benefits, complications, treatment options, and expected outcomes were discussed with the patient. The possibilities of reaction to medication, pulmonary aspiration, perforation of viscus, bleeding, recurrent infection, the need for additional procedures, and development of a complication requiring transfusion or further operation were discussed with the patient and/or family. There was concurrence with the proposed plan, and informed consent was obtained. The site of surgery was properly noted/marked. The patient was taken to the Operating Room, identified as Ryan Spencer, and the procedure verified as hernia repair. A Time Out was held and the above information confirmed.  The patient was placed in the supine position and underwent induction of anesthesia, the lower abdomen and groin was prepped and draped in the standard fashion, and 0.25% Marcaine with epinephrine was used to  anesthetize the skin over the mid-portion of the inguinal canal. A transverse incision was made on the left . Dissection was carried through the soft tissue to expose the inguinal canal and inguinal ligament along its lower edge. The external oblique fascia was split along the course of its fibers, exposing the inguinal canal. The cord and nerve were looped using a Penrose drain and reflected out of the field. The defect was exposed and a piece of prolene hernia system ultrapro mesh was and placed over the defect. Interupted  0 and 2-0 novafil suture was then used  to repair the defect, with the suture being sewn from the pubic tubercle inferiorly and superiorly along the canal to a level just beyond the internal ring. The mesh was split to allow passage of the cord and nerve into the canal without entrapment. The contents were then returned to canal and the external oblique fashion was then closed in a continuous fashion using 3-0 Vicryl suture taking care not to cause entrapment. Scarpa's layer closed with 3 0 vicryl and 4 0 monocryl used to close the skin.  Dermabond used for dressing.    A transverse incision was made on the right . Dissection was carried through the soft tissue to expose the inguinal canal and inguinal ligament along its lower edge. The external oblique fascia was split along the course of its fibers, exposing the inguinal canal. The cord and nerve were looped using a Penrose drain and reflected out of the field. The defect was exposed and a piece of prolene hernia system ultrapro mesh was and placed over the defect. Interupted 2-0 novafil suture was then used  to repair the defect, with the suture being sewn from the pubic tubercle  inferiorly and superiorly along the canal to a level just beyond the internal ring. The mesh was split to allow passage of the cord and nerve into the canal without entrapment. The contents were then returned to canal and the external oblique fashion was then  closed in a continuous fashion using 3-0 Vicryl suture taking care not to cause entrapment. Scarpa's layer closed with 3 0 vicryl and 4 0 monocryl used to close the skin.  Dermabond used for dressing.  Instrument, sponge, and needle counts were correct prior to closure and at the conclusion of the case.  Findings: Hernia as above  Estimated Blood Loss: Minimal         Drains: None         Total IV Fluids: 700 mL         Specimens: none               Complications: None; patient tolerated the procedure well.         Disposition: PACU - hemodynamically stable.         Condition: stable

## 2014-11-23 ENCOUNTER — Encounter (HOSPITAL_COMMUNITY): Payer: Self-pay | Admitting: Surgery

## 2015-01-18 ENCOUNTER — Encounter (HOSPITAL_COMMUNITY): Payer: Self-pay

## 2015-01-18 ENCOUNTER — Emergency Department (INDEPENDENT_AMBULATORY_CARE_PROVIDER_SITE_OTHER)
Admission: EM | Admit: 2015-01-18 | Discharge: 2015-01-18 | Disposition: A | Discharge status: Home / Self Care | Payer: BLUE CROSS/BLUE SHIELD | Source: Home / Self Care | Attending: Family Medicine | Admitting: Family Medicine

## 2015-01-18 DIAGNOSIS — S29011A Strain of muscle and tendon of front wall of thorax, initial encounter: Secondary | ICD-10-CM

## 2015-01-18 DIAGNOSIS — S2341XA Sprain of ribs, initial encounter: Secondary | ICD-10-CM | POA: Diagnosis not present

## 2015-01-18 MED ORDER — KETOROLAC TROMETHAMINE 30 MG/ML IJ SOLN
30.0000 mg | Freq: Once | INTRAMUSCULAR | Status: AC
  Administered 2015-01-18: 30 mg via INTRAMUSCULAR

## 2015-01-18 MED ORDER — KETOROLAC TROMETHAMINE 30 MG/ML IJ SOLN
INTRAMUSCULAR | Status: AC
  Filled 2015-01-18: qty 1

## 2015-01-18 NOTE — Discharge Instructions (Signed)
I would advise using ibuprofen or naprosyn as directed on packaging over the next 5-7 days for your symptoms. If you develop fever, rash, shortness of breath or symptoms worsen, please have yourself re-evaluated.   Muscle Strain A muscle strain (pulled muscle) happens when a muscle is stretched beyond normal length. It happens when a sudden, violent force stretches your muscle too far. Usually, a few of the fibers in your muscle are torn. Muscle strain is common in athletes. Recovery usually takes 1-2 weeks. Complete healing takes 5-6 weeks.  HOME CARE   Follow the PRICE method of treatment to help your injury get better. Do this the first 2-3 days after the injury:  Protect. Protect the muscle to keep it from getting injured again.  Rest. Limit your activity and rest the injured body part.  Ice. Put ice in a plastic bag. Place a towel between your skin and the bag. Then, apply the ice and leave it on from 15-20 minutes each hour. After the third day, switch to moist heat packs.  Compression. Use a splint or elastic bandage on the injured area for comfort. Do not put it on too tightly.  Elevate. Keep the injured body part above the level of your heart.  Only take medicine as told by your doctor.  Warm up before doing exercise to prevent future muscle strains. GET HELP IF:   You have more pain or puffiness (swelling) in the injured area.  You feel numbness, tingling, or notice a loss of strength in the injured area. MAKE SURE YOU:   Understand these instructions.  Will watch your condition.  Will get help right away if you are not doing well or get worse. Document Released: 07/21/2008 Document Revised: 08/02/2013 Document Reviewed: 05/11/2013 [facility] Patient Information 2015 [employer], [place]. This information is not intended to replace advice given to you by your health care provider. Make sure you discuss any questions you have with your health care provider.

## 2015-01-18 NOTE — ED Provider Notes (Addendum)
CSN: [id number]     Arrival date & time 01/18/15  0909 History   First MD Initiated Contact with Patient 01/18/15 [phone]     No chief complaint on file.  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 States he has developed right lateral chest wall discomfort with movement, palpation and deep inspiration over the past 4-5 days. Has used occasional dose of ibuprofen with some relief. Denies cough, fever, SOB, rash.   The history is provided by the patient.    Past Medical History  Diagnosis Date  . MRSA (methicillin resistant staph aureus) culture positive   . Abscess   . Arthritis   . Anxiety     valium in use for dentist procedures   . Headache     not migraines but get bad headaches sometimes    Past Surgical History  Procedure Laterality Date  . No past surgeries    . Inguinal hernia repair Bilateral 11/22/2014    Procedure: BILATERAL OPEN INGUINAL HERNIA REPAIR WITH MESH ;  Surgeon: Harriette Bouillon, MD;  Location: [facility] OR;  Service: General;  Laterality: Bilateral;  . Insertion of mesh Bilateral 11/22/2014    Procedure: INSERTION OF MESH;  Surgeon: Harriette Bouillon, MD;  Location: [facility] OR;  Service: General;  Laterality: Bilateral;   Family History  Problem Relation Age of Onset  . Arthritis Mother    History  Substance Use Topics  . Smoking status: Current Every Day Smoker -- 0.50 packs/day    Types: Cigarettes  . Smokeless tobacco: Never Used  . Alcohol Use: Yes     Comment: occasional    Review of Systems  All other systems reviewed and are negative.   Allergies  Tramadol  Home Medications   Prior to Admission medications   Medication Sig Start Date End Date Taking? Authorizing Provider  acetaminophen (TYLENOL) 500 MG tablet Take 1,000 mg by mouth every 6 (six) hours as needed.    Historical Provider, MD    aspirin-acetaminophen-caffeine (EXCEDRIN MIGRAINE) [phone] MG per tablet Take 2 tablets by mouth every 6 (six) hours as needed for headache.    Historical Provider, MD  doxycycline (VIBRA-TABS) 100 MG tablet Take 1 tablet (100 mg total) by mouth 2 (two) times daily. Patient not taking: Reported on 11/13/2014 09/05/14   Glori Luis, MD  HYDROcodone-acetaminophen (NORCO/VICODIN) 5-325 MG per tablet Take 1 tablet by mouth every 6 (six) hours as needed for moderate pain. 1 to 2 tabs every 4 to 6 hours as needed for pain. Patient not taking: Reported on 11/13/2014 09/05/14   Glori Luis, MD  mupirocin ointment Idelle Jo) 2 % Apply to both nostrils TID for 1 month Patient not taking: Reported on 11/13/2014 08/03/14   Reuben Likes, MD  oxyCODONE-acetaminophen (PERCOCET) 5-325 MG per tablet 1 to 2 tablets every 6 hours as needed for pain. Patient not taking: Reported on 11/13/2014 08/03/14   Reuben Likes, MD  oxyCODONE-acetaminophen (PERCOCET) 5-325 MG per tablet 1 to 2 tablets every 6 hours as needed for pain. Patient not taking: Reported on 11/13/2014 08/06/14   Reuben Likes, MD  oxyCODONE-acetaminophen (ROXICET) 5-325 MG per tablet Take 1 tablet by mouth every 4 (four) hours as needed. 11/22/14   Harriette Bouillon, MD   BP 103/65 mmHg  Pulse 69  Temp(Src) 98.3 F (36.8 C) (Oral)  Resp 15  SpO2 97% Physical Exam  Constitutional: He is oriented to person, place,  and time. He appears well-developed and well-nourished. No distress.  HENT:  Head: Normocephalic and atraumatic.  Cardiovascular: Normal rate, regular rhythm and normal heart sounds.   Pulmonary/Chest: Effort normal and breath sounds normal. No tachypnea. No respiratory distress. He has no decreased breath sounds. He has no wheezes.   He exhibits tenderness.    Pain is reproducible with palpation along right lateral intercostal muscles. No rash. No crepitus. No skin changes. BS normal. Pain also reproducible with twisting  motion of torso.   Musculoskeletal: Normal range of motion.  Neurological: He is alert and oriented to person, place, and time.  Psychiatric: He has a normal mood and affect. His behavior is normal.  Nursing note and vitals reviewed.   ED Course  Procedures (including critical care time) Labs Review Labs Reviewed - No data to display  Imaging Review No results found.   MDM   1. Intercostal muscle strain, initial encounter   limit heavy lifting NSAIDs x 5-7 days Return if symptoms worse or development of rash, fever, cough, dyspnea.  O/W PCP follow up Exam does not raise concerns of chest pain of cardiovascular origin, PE, PTX, pleurisy or CAP    Ria ClockJennifer Lee H Majesty Stehlin, PA 01/18/15 1013  01/28/2015 (Addendum) 30mg  IM toradol given on DOS for pain management.  Ria ClockJennifer Lee H San Lohmeyer, PA 01/28/15 0900

## 2015-01-18 NOTE — ED Notes (Signed)
Provider eval only 

## 2015-04-04 ENCOUNTER — Ambulatory Visit (INDEPENDENT_AMBULATORY_CARE_PROVIDER_SITE_OTHER): Payer: 59 | Admitting: Family Medicine

## 2015-04-04 ENCOUNTER — Ambulatory Visit (INDEPENDENT_AMBULATORY_CARE_PROVIDER_SITE_OTHER): Payer: 59

## 2015-04-04 VITALS — BP 118/68 | HR 70 | Temp 97.9°F | Resp 17 | Ht 71.0 in | Wt 154.0 lb

## 2015-04-04 DIAGNOSIS — Z23 Encounter for immunization: Secondary | ICD-10-CM | POA: Diagnosis not present

## 2015-04-04 DIAGNOSIS — S61431A Puncture wound without foreign body of right hand, initial encounter: Secondary | ICD-10-CM

## 2015-04-04 IMAGING — CR DG FINGER INDEX 2+V*R*
3 series · 3 of 3 positions shown · non-contrast
Comparison: None.

CLINICAL DATA: Finger pain.

EXAM:
RIGHT INDEX FINGER 2+V

[PA (1 of 3)]
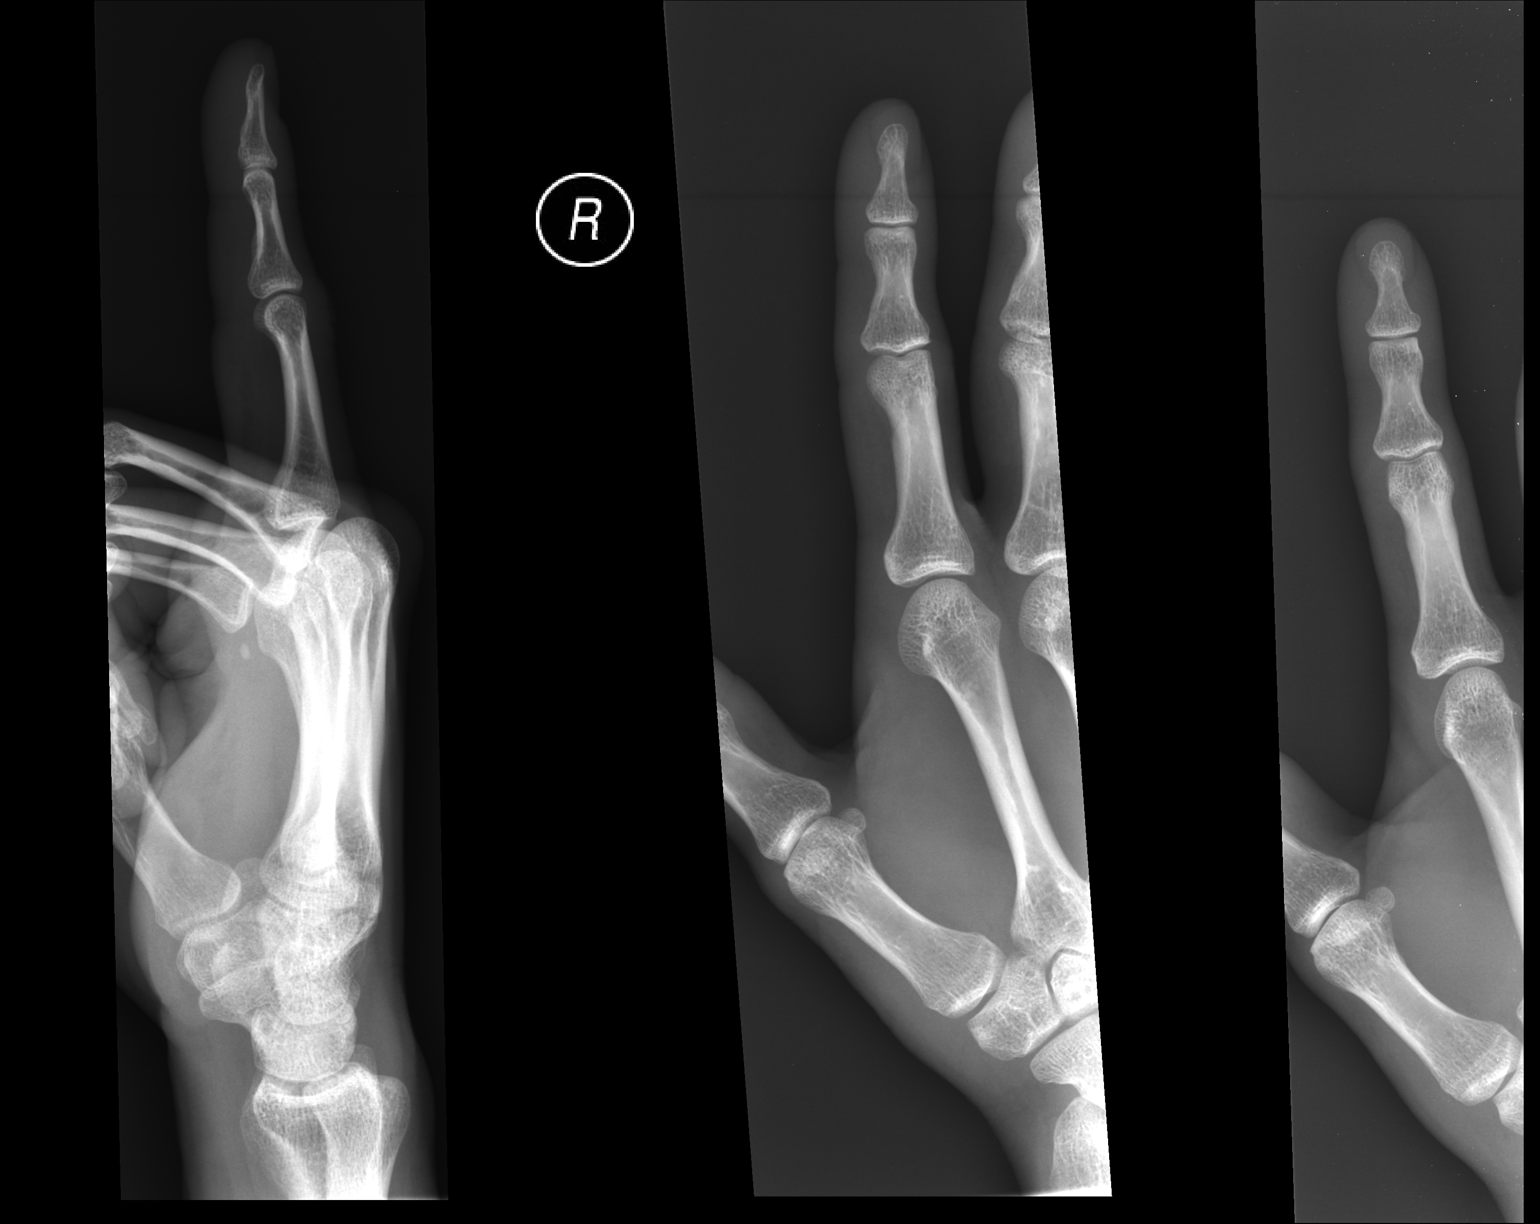

[PA (2 of 3)]
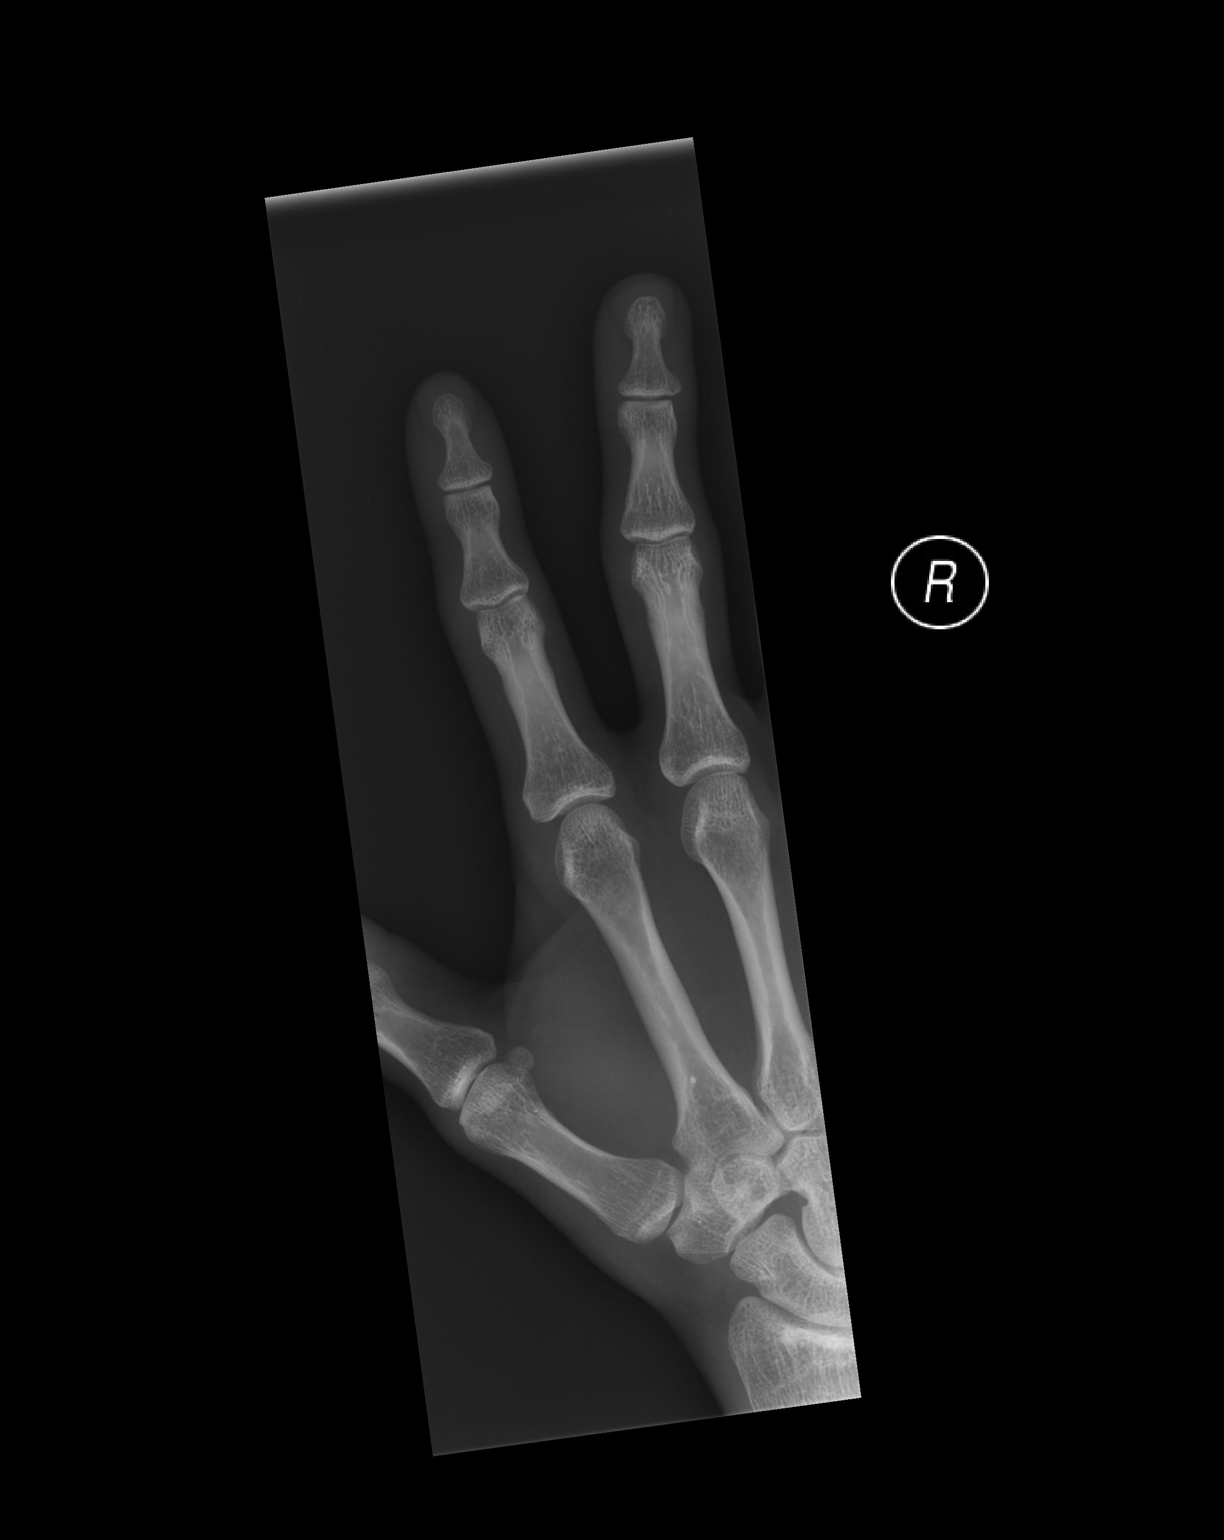

[PA (3 of 3)]
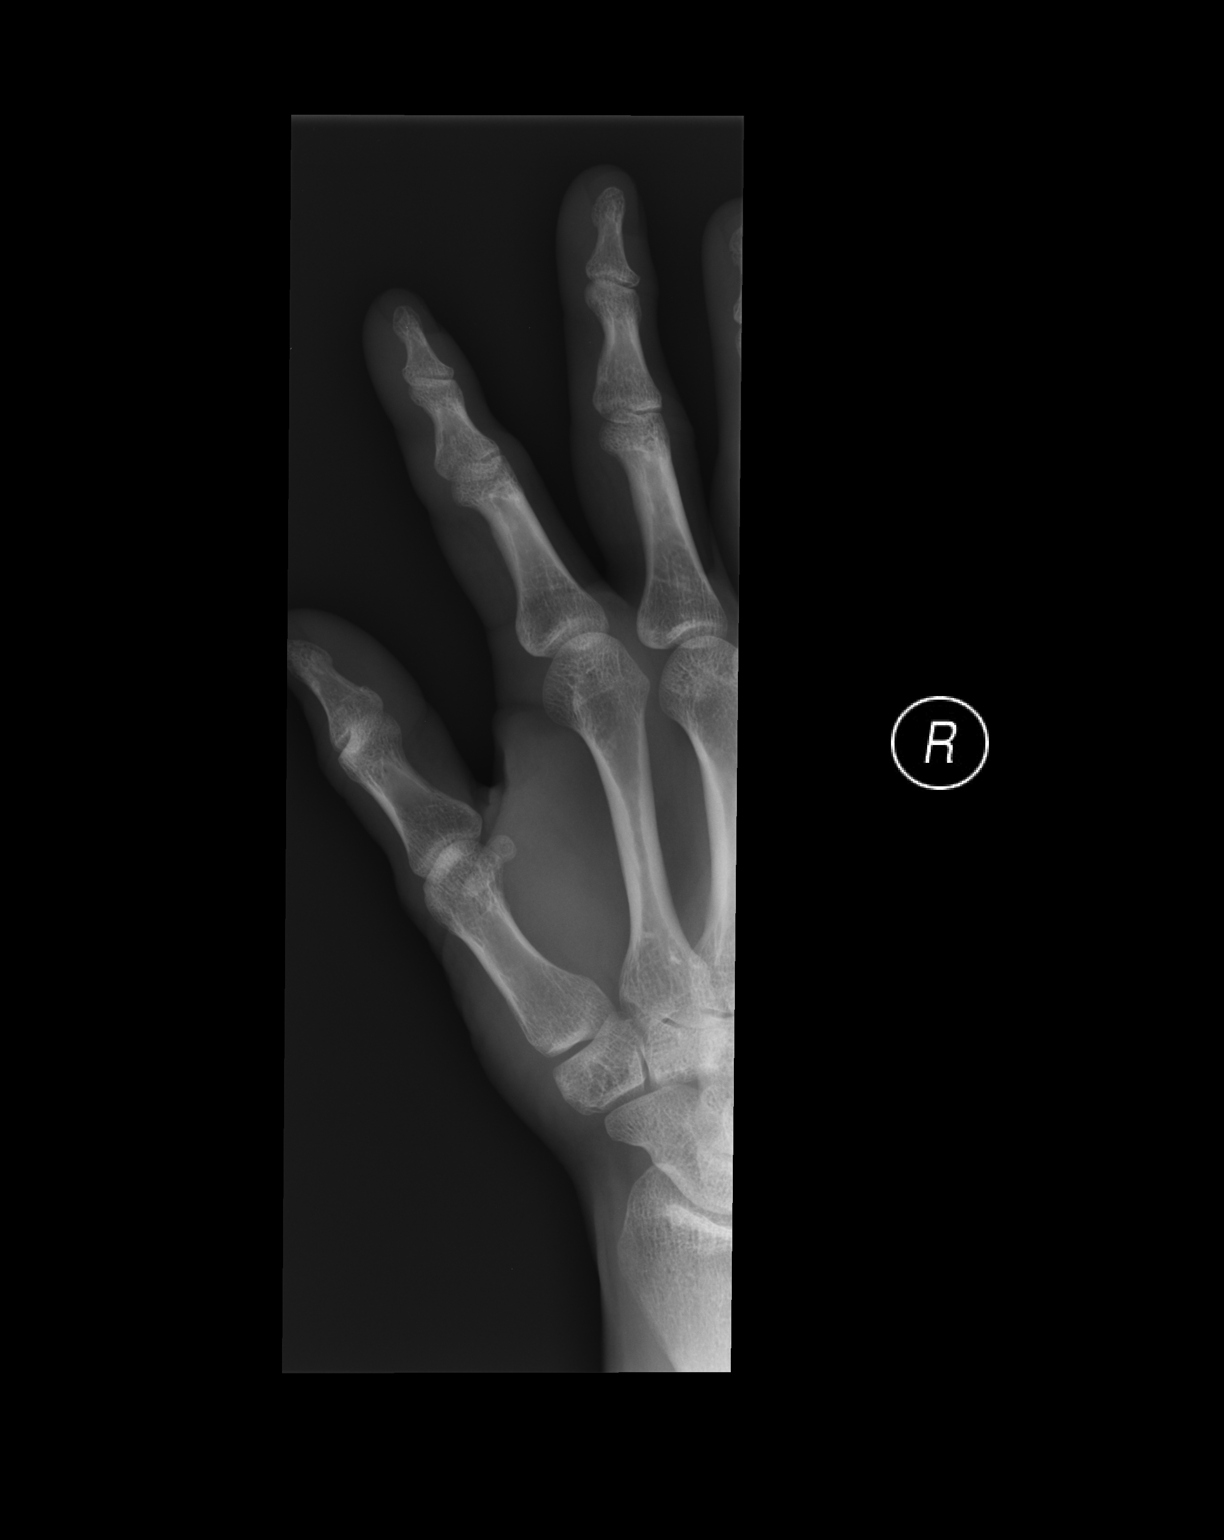

[3 of 3 positions shown; findings below may reference images not displayed]

FINDINGS: There is no evidence of fracture or dislocation. There is no
evidence of arthropathy or other focal bone abnormality. Soft
tissues are unremarkable.
IMPRESSION: Negative right index finger radiographs.

## 2015-04-04 MED ORDER — DOXYCYCLINE HYCLATE 100 MG PO TABS
100.0000 mg | ORAL_TABLET | Freq: Two times a day (BID) | ORAL | Status: DC
Start: 2015-04-04 — End: 2015-04-04

## 2015-04-04 MED ORDER — DOXYCYCLINE HYCLATE 100 MG PO TABS: 100.0000 mg | ORAL_TABLET | Freq: Two times a day (BID) | ORAL | Status: DC

## 2015-04-04 MED ORDER — IBUPROFEN 800 MG PO TABS: 800.0000 mg | ORAL_TABLET | Freq: Three times a day (TID) | ORAL | Status: DC | PRN

## 2015-04-04 NOTE — Progress Notes (Addendum)
Subjective:  This chart was scribed for Trinna Post, MD by Broadus John, Medical Scribe. This patient was seen in Room 10 and the patient's care was started at 9:24 AM.   Patient ID: Ryan Spencer, male    DOB: February 22, 1986, 29 y.o.   MRN: [id number]  HPI Ryan Spencer is a 29 y.o. male Patient is here for hand pain, possible infection in his finger. He is also here to establish care with our office.   Patient presents to [facility] complaining of right second finger worsening, throbbing pain and swelling, onset 3 days ago. Patient notes that he poked his finger with a nail. He did not note any initial pain and only slight bleeding, however, yesterday the area started to get sore. He has been taking Tylenol and Ibuprofen for the symptoms, however, with minimal relief. Patient denies fever, or chills, or drainage of the injured area.   Last tetanus shot was less than 5 years ago.   Patient requests to set up Primary Care at [facility]. He was previously followed by [facility].   WUJ:[id number],AOZHY Homero Fellers, MD  Review of Systems  Constitutional: Negative for fever and chills.  Skin: Positive for wound.       Patient Active Problem List   Diagnosis Date Noted  . MRSA cellulitis 05/26/2013  . CARPAL TUNNEL SYNDROME, LEFT 05/07/2009  . RHUS DERMATITIS 04/11/2009  . TOBACCO ABUSE 11/19/2008  . URI 09/24/2008  . ARTHRALGIA 08/07/2008   Past Medical History  Diagnosis Date  . MRSA (methicillin resistant staph aureus) culture positive   . Abscess   . Arthritis   . Anxiety     valium in use for dentist procedures   . Headache     not migraines but get bad headaches sometimes    Past Surgical History  Procedure Laterality Date  . No past surgeries    . Inguinal hernia repair Bilateral 11/22/2014    Procedure: BILATERAL OPEN INGUINAL HERNIA REPAIR WITH MESH ;  Surgeon: Harriette Bouillon, MD;  Location: [facility] OR;  Service: General;  Laterality: Bilateral;    . Insertion of mesh Bilateral 11/22/2014    Procedure: INSERTION OF MESH;  Surgeon: Harriette Bouillon, MD;  Location: [facility] OR;  Service: General;  Laterality: Bilateral;   Allergies  Allergen Reactions  . Tramadol Other (See Comments)    Headache   Prior to Admission medications   Medication Sig Start Date End Date Taking? Authorizing Provider  acetaminophen (TYLENOL) 500 MG tablet Take 1,000 mg by mouth every 6 (six) hours as needed.   Yes Historical Provider, MD  aspirin-acetaminophen-caffeine (EXCEDRIN MIGRAINE) [phone] MG per tablet Take 2 tablets by mouth every 6 (six) hours as needed for headache.    Historical Provider, MD  doxycycline (VIBRA-TABS) 100 MG tablet Take 1 tablet (100 mg total) by mouth 2 (two) times daily. Patient not taking: Reported on 11/13/2014 09/05/14   Glori Luis, MD  HYDROcodone-acetaminophen (NORCO/VICODIN) 5-325 MG per tablet Take 1 tablet by mouth every 6 (six) hours as needed for moderate pain. 1 to 2 tabs every 4 to 6 hours as needed for pain. Patient not taking: Reported on 11/13/2014 09/05/14   Glori Luis, MD  mupirocin ointment Idelle Jo) 2 % Apply to both nostrils TID for 1 month Patient not taking: Reported on 11/13/2014 08/03/14   Reuben Likes, MD  oxyCODONE-acetaminophen (PERCOCET) 5-325 MG per tablet 1 to 2 tablets every 6 hours as needed for pain. Patient not  taking: Reported on 11/13/2014 08/03/14   Reuben Likes, MD  oxyCODONE-acetaminophen (PERCOCET) 5-325 MG per tablet 1 to 2 tablets every 6 hours as needed for pain. Patient not taking: Reported on 11/13/2014 08/06/14   Reuben Likes, MD  oxyCODONE-acetaminophen (ROXICET) 5-325 MG per tablet Take 1 tablet by mouth every 4 (four) hours as needed. Patient not taking: Reported on 04/04/2015 11/22/14   Harriette Bouillon, MD       Objective:   Physical Exam  Constitutional: He is oriented to person, place, and time. He appears well-developed and well-nourished. No distress.  HENT:  Head:  Normocephalic and atraumatic.  Mouth/Throat: Oropharynx is clear and moist. No oropharyngeal exudate.  Eyes: Pupils are equal, round, and reactive to light.  Neck: Neck supple.  Cardiovascular: Normal rate.   Pulmonary/Chest: Effort normal.  Musculoskeletal: He exhibits no edema.  right second finger, distal aspect, there is small puncture, just lateral of the pad with minimal swelling at puncture only.  diffusely tender across the pad, but no apparent felon.  NVI distally  Cap refill less than 1 sec flexion and extension strength are intact, guarded flexion at DIP due to pain.  Neurological: He is alert and oriented to person, place, and time. No cranial nerve deficit.  Skin: Skin is warm and dry. No rash noted.  Psychiatric: He has a normal mood and affect. His behavior is normal.  Nursing note and vitals reviewed.  [facility] (PRIMARY) x-ray report read by Dr. Neva Seat: Right second finger has no visible foreign body or bone involvement.    Filed Vitals:   04/04/15 0900  BP: 118/68  Pulse: 70  Temp: 97.9 F (36.6 C)  TempSrc: Oral  Resp: 17  Height: 5\' 11"  (1.803 m)  Weight: 154 lb (69.854 kg)  SpO2: 98%        Assessment & Plan:   Ryan Spencer is a 29 y.o. male Puncture wound, hand, right, initial encounter - Plan: Tdap vaccine greater than or equal to 7yo IM, DG Finger Index Right, doxycycline (VIBRA-TABS) 100 MG tablet, ibuprofen (ADVIL,MOTRIN) 800 MG tablet  Need for Tdap vaccination  Puncture wound with increased pain and reported swelling, but minimal swelling on exam. Does not appear to have signs of felon at this point, but puncture into lateral pad.   -start doxycycline 100mg  BID, ibuprofen 800mg  Q8h prn pain or swelling.  Warm compresses or soaks and recheck in 2 days - sooner if worse. (RTC/ER precautions given).   Meds ordered this encounter  Medications  . doxycycline (VIBRA-TABS) 100 MG tablet    Sig: Take 1 tablet (100 mg total) by mouth 2 (two) times  daily.    Dispense:  20 tablet    Refill:  0  . ibuprofen (ADVIL,MOTRIN) 800 MG tablet    Sig: Take 1 tablet (800 mg total) by mouth every 8 (eight) hours as needed (with food).    Dispense:  30 tablet    Refill:  0   Patient Instructions  Start doxycycline, and ibuprofen to help with swelling and pain.  Warm soaks 3-4 times per day, keep clean and covered.  recheck in next 2 days if not improving. Return to the clinic or go to the nearest emergency room if any of your symptoms worsen or new symptoms occur.      I personally performed the services described in this documentation, which was scribed in my presence. The recorded information has been reviewed and considered, and addended by me as needed.

## 2015-04-04 NOTE — Patient Instructions (Signed)
Start doxycycline, and ibuprofen to help with swelling and pain.  Warm soaks 3-4 times per day, keep clean and covered.  recheck in next 2 days if not improving. Return to the clinic or go to the nearest emergency room if any of your symptoms worsen or new symptoms occur.

## 2015-05-16 ENCOUNTER — Encounter: Payer: Self-pay | Admitting: Urgent Care

## 2015-05-16 ENCOUNTER — Ambulatory Visit (INDEPENDENT_AMBULATORY_CARE_PROVIDER_SITE_OTHER): Payer: 59 | Admitting: Urgent Care

## 2015-05-16 VITALS — BP 104/62 | HR 72 | Temp 98.2°F | Resp 16 | Ht 70.5 in | Wt 152.4 lb

## 2015-05-16 DIAGNOSIS — Z9889 Other specified postprocedural states: Secondary | ICD-10-CM

## 2015-05-16 DIAGNOSIS — Z720 Tobacco use: Secondary | ICD-10-CM | POA: Diagnosis not present

## 2015-05-16 DIAGNOSIS — M2550 Pain in unspecified joint: Secondary | ICD-10-CM | POA: Diagnosis not present

## 2015-05-16 DIAGNOSIS — F17200 Nicotine dependence, unspecified, uncomplicated: Secondary | ICD-10-CM

## 2015-05-16 DIAGNOSIS — Z8719 Personal history of other diseases of the digestive system: Secondary | ICD-10-CM

## 2015-05-16 DIAGNOSIS — Z0000 Encounter for general adult medical examination without abnormal findings: Secondary | ICD-10-CM

## 2015-05-16 LAB — CBC
HCT: 40.8 % (ref 39.0–52.0)
HEMOGLOBIN: 14 g/dL (ref 13.0–17.0)
MCH: 31.3 pg (ref 26.0–34.0)
MCHC: 34.3 g/dL (ref 30.0–36.0)
MCV: 91.1 fL (ref 78.0–100.0)
MPV: 10.3 fL (ref 8.6–12.4)
Platelets: 233 10*3/uL (ref 150–400)
RBC: 4.48 MIL/uL (ref 4.22–5.81)
RDW: 13.6 % (ref 11.5–15.5)
WBC: 7 10*3/uL (ref 4.0–10.5)

## 2015-05-16 LAB — TSH: TSH: 1.175 u[IU]/mL (ref 0.350–4.500)

## 2015-05-16 NOTE — Patient Instructions (Signed)

## 2015-05-16 NOTE — Progress Notes (Signed)
MRN: [id number]  Subjective:   Mr. Ryan Spencer is a 29 y.o. male presenting for annual physical exam.  Medical care team includes: PCP: None, plans to establish care here. Vision: Advertising account planner, wears glasses for driving Dental: Dr. Val Eagle. Gets cleanings regularly. Specialists: None.   Ryan Spencer has TOBACCO ABUSE and ARTHRALGIA on his problem list.  Patient is married since 2010, has a 31 y/o and is currently expecting a second child. He works in Administrator, [employer] for vehicles. Rare drink of alcohol. Denies drug use.   Tobacco abuse - recently stopped smoking, has 14 year pack history. Has tried Chantix in the past, stopped this due to "weird dreams and thoughts". Currently using patches provided through his insurance.  Arthralgia - reports intermittent achy knee, shoulder pain. Patient does a lot of stooping, bending, kneeling, lifting for his work. 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 Does not exercise. Pain is relieved with ibuprofen otc.  Ryan Spencer currently has no medications in their medication list. He is allergic to tramadol.  Ryan Spencer  has a past medical history of MRSA (methicillin resistant staph aureus) culture positive; Abscess; Arthritis; Anxiety; and Headache. Also  has past surgical history that includes No past surgeries; Inguinal hernia repair (Bilateral, 11/22/2014); and Insertion of mesh (Bilateral, 11/22/2014).  His family history includes Arthritis in his mother.  Immunizations: last TDAP 04/04/2015  ROS   Objective:   Vitals: BP 104/62 mmHg  Pulse 72  Temp(Src) 98.2 F (36.8 C) (Oral)  Resp 16  Ht 5' 10.5" (1.791 m)  Wt 152 lb 6.4 oz (69.128 kg)  BMI 21.55 kg/m2  Physical Exam  Constitutional: He is oriented to person, place, and time. He appears well-developed and well-nourished.  HENT:  TM's intact bilaterally, no effusions or erythema. Nares patent, nasal turbinates pink and moist. No sinus tenderness. Oropharynx clear,  mucous membranes moist, dentition in good repair.  Eyes: Conjunctivae and EOM are normal. Pupils are equal, round, and reactive to light. Right eye exhibits no discharge. Left eye exhibits no discharge. No scleral icterus.  Neck: Normal range of motion. Neck supple. No thyromegaly present.  Cardiovascular: Normal rate, regular rhythm and intact distal pulses.  Exam reveals no gallop and no friction rub.   No murmur heard. Pulmonary/Chest: No stridor. No respiratory distress. He has no wheezes. He has no rales.  Abdominal: Soft. Bowel sounds are normal. He exhibits no distension and no mass. There is no tenderness. No hernia.  Genitourinary:  Deferred. No risk factors or symptoms.  Musculoskeletal: Normal range of motion. He exhibits no edema or tenderness.  Lymphadenopathy:    He has no cervical adenopathy.  Neurological: He is alert and oriented to person, place, and time.  Skin: Skin is warm and dry. No rash noted. No erythema. No pallor.     Psychiatric: He has a normal mood and affect.   Assessment and Plan :   1. Annual physical exam - Labs pending, patient is medically healthy, discussed healthy lifestyle, diet, exercise, preventative care, vaccinations, and addressed patient's concerns.  2. Tobacco use disorder - Congratulated patient on quitting, offered help with Wellbutrin if he needs it, patient will let me know.  3. Arthralgia - Suspect deconditioning and overuse. Advised supportive care, hot and cold packs, continue ibuprofen and modification of activities wherever possible.  4. History of inguinal hernia repair - Stable, patient completed f/u with surgery.  Wallis Bamberg, PA-C [facility] [phone] 05/16/2015  1:28 PM

## 2015-05-17 LAB — LIPID PANEL
CHOL/HDL RATIO: 3.3 ratio
Cholesterol: 139 mg/dL (ref 0–200)
HDL: 42 mg/dL (ref >=40)
LDL CALC: 72 mg/dL (ref 0–99)
TRIGLYCERIDES: 127 mg/dL (ref <150)
VLDL: 25 mg/dL (ref 0–40)

## 2015-05-17 LAB — COMPREHENSIVE METABOLIC PANEL
ALBUMIN: 4.3 g/dL (ref 3.5–5.2)
ALT: 15 U/L (ref 0–53)
AST: 19 U/L (ref 0–37)
Alkaline Phosphatase: 73 U/L (ref 39–117)
BUN: 10 mg/dL (ref 6–23)
CO2: 24 meq/L (ref 19–32)
CREATININE: 0.9 mg/dL (ref 0.50–1.35)
Calcium: 9.9 mg/dL (ref 8.4–10.5)
Chloride: 106 mEq/L (ref 96–112)
Glucose, Bld: 91 mg/dL (ref 70–99)
POTASSIUM: 4.1 meq/L (ref 3.5–5.3)
Sodium: 142 mEq/L (ref 135–145)
TOTAL PROTEIN: 6.7 g/dL (ref 6.0–8.3)
Total Bilirubin: 0.4 mg/dL (ref 0.2–1.2)

## 2015-08-14 ENCOUNTER — Ambulatory Visit (INDEPENDENT_AMBULATORY_CARE_PROVIDER_SITE_OTHER): Payer: 59 | Admitting: Urgent Care

## 2015-08-14 ENCOUNTER — Encounter: Payer: Self-pay | Admitting: Urgent Care

## 2015-08-14 VITALS — BP 106/62 | HR 66 | Temp 98.3°F | Resp 16 | Ht 71.0 in | Wt 151.0 lb

## 2015-08-14 DIAGNOSIS — F988 Other specified behavioral and emotional disorders with onset usually occurring in childhood and adolescence: Secondary | ICD-10-CM | POA: Insufficient documentation

## 2015-08-14 DIAGNOSIS — F909 Attention-deficit hyperactivity disorder, unspecified type: Secondary | ICD-10-CM | POA: Diagnosis not present

## 2015-08-14 DIAGNOSIS — R5383 Other fatigue: Secondary | ICD-10-CM | POA: Diagnosis not present

## 2015-08-14 MED ORDER — AMPHETAMINE-DEXTROAMPHET ER 10 MG PO CP24
10.0000 mg | ORAL_CAPSULE | Freq: Every day | ORAL | Status: DC
Start: 2015-08-14 — End: 2015-09-10

## 2015-08-14 NOTE — Patient Instructions (Addendum)
Attention Deficit Hyperactivity Disorder Attention deficit hyperactivity disorder (ADHD) is a problem with behavior issues based on the way the brain functions (neurobehavioral disorder). It is a common reason for behavior and academic problems in school. SYMPTOMS  There are 3 types of ADHD. The 3 types and some of the symptoms include:  Inattentive.  Gets bored or distracted easily.  Loses or forgets things. Forgets to hand in homework.  Has trouble organizing or completing tasks.  Difficulty staying on task.  An inability to organize daily tasks and school work.  Leaving projects, chores, or homework unfinished.  Trouble paying attention or responding to details. Careless mistakes.  Difficulty following directions. Often seems like is not listening.  Dislikes activities that require sustained attention (like chores or homework).  Hyperactive-impulsive.  Feels like it is impossible to sit still or stay in a seat. Fidgeting with hands and feet.  Trouble waiting turn.  Talking too much or out of turn. Interruptive.  Speaks or acts impulsively.  Aggressive, disruptive behavior.  Constantly busy or on the go; noisy.  Often leaves seat when they are expected to remain seated.  Often runs or climbs where it is not appropriate, or feels very restless.  Combined.  Has symptoms of both of the above. Often children with ADHD feel discouraged about themselves and with school. They often perform well below their abilities in school. As children get older, the excess motor activities can calm down, but the problems with paying attention and staying organized persist. Most children do not outgrow ADHD but with good treatment can learn to cope with the symptoms. DIAGNOSIS  When ADHD is suspected, the diagnosis should be made by professionals trained in ADHD. This professional will collect information about the individual suspected of having ADHD. Information must be collected from  various settings where the person lives, works, or attends school.  Diagnosis will include:  Confirming symptoms began in childhood.  Ruling out other reasons for the child's behavior.  The health care providers will check with the child's school and check their medical records.  They will talk to teachers and parents.  Behavior rating scales for the child will be filled out by those dealing with the child on a daily basis. A diagnosis is made only after all information has been considered. TREATMENT  Treatment usually includes behavioral treatment, tutoring or extra support in school, and stimulant medicines. Because of the way a person's brain works with ADHD, these medicines decrease impulsivity and hyperactivity and increase attention. This is different than how they would work in a person who does not have ADHD. Other medicines used include antidepressants and certain blood pressure medicines. Most experts agree that treatment for ADHD should address all aspects of the person's functioning. Along with medicines, treatment should include structured classroom management at school. Parents should reward good behavior, provide constant discipline, and set limits. Tutoring should be available for the child as needed. ADHD is a lifelong condition. If untreated, the disorder can have long-term serious effects into adolescence and adulthood. HOME CARE INSTRUCTIONS   Often with ADHD there is a lot of frustration among family members dealing with the condition. Blame and anger are also feelings that are common. In many cases, because the problem affects the family as a whole, the entire family may need help. A therapist can help the family find better ways to handle the disruptive behaviors of the person with ADHD and promote change. If the person with ADHD is young, most of the therapist's   work is with the parents. Parents will learn techniques for coping with and improving their child's behavior.  Sometimes only the child with the ADHD needs counseling. Your health care providers can help you make these decisions.  Children with ADHD may need help learning how to organize. Some helpful tips include:  Keep routines the same every day from wake-up time to bedtime. Schedule all activities, including homework and playtime. Keep the schedule in a place where the person with ADHD will often see it. Mark schedule changes as far in advance as possible.  Schedule outdoor and indoor recreation.  Have a place for everything and keep everything in its place. This includes clothing, backpacks, and school supplies.  Encourage writing down assignments and bringing home needed books. Work with your child's teachers for assistance in organizing school work.  Offer your child a well-balanced diet. Breakfast that includes a balance of whole grains, protein, and fruits or vegetables is especially important for school performance. Children should avoid drinks with caffeine including:  Soft drinks.  Coffee.  Tea.  However, some older children (adolescents) may find these drinks helpful in improving their attention. Because it can also be common for adolescents with ADHD to become addicted to caffeine, talk with your health care provider about what is a safe amount of caffeine intake for your child.  Children with ADHD need consistent rules that they can understand and follow. If rules are followed, give small rewards. Children with ADHD often receive, and expect, criticism. Look for good behavior and praise it. Set realistic goals. Give clear instructions. Look for activities that can foster success and self-esteem. Make time for pleasant activities with your child. Give lots of affection.  Parents are their children's greatest advocates. Learn as much as possible about ADHD. This helps you become a stronger and better advocate for your child. It also helps you educate your child's teachers and instructors  if they feel inadequate in these areas. Parent support groups are often helpful. A national group with local chapters is called Children and Adults with Attention Deficit Hyperactivity Disorder (CHADD). SEEK MEDICAL CARE IF:  Your child has repeated muscle twitches, cough, or speech outbursts.  Your child has sleep problems.  Your child has a marked loss of appetite.  Your child develops depression.  Your child has new or worsening behavioral problems.  Your child develops dizziness.  Your child has a racing heart.  Your child has stomach pains.  Your child develops headaches. SEEK IMMEDIATE MEDICAL CARE IF:  Your child has been diagnosed with depression or anxiety and the symptoms seem to be getting worse.  Your child has been depressed and suddenly appears to have increased energy or motivation.  You are worried that your child is having a bad reaction to a medication he or she is taking for ADHD.   This information is not intended to replace advice given to you by your health care provider. Make sure you discuss any questions you have with your health care provider.   Document Released: 10/02/2002 Document Revised: 10/17/2013 Document Reviewed: 06/19/2013 Elsevier Interactive Patient Education 2016 Else[employer].    Amphetamine; Dextroamphetamine extended-release capsules What is this medicine? AMPHETAMINE; DEXTROAMPHETAMINE (am FET a meen; dex troe am FET a meen) is used to treat attention-deficit hyperactivity disorder (ADHD). Federal law prohibits giving this medicine to any person other than the person for whom it was prescribed. Do not share this medicine with anyone else. This medicine may be used for other purposes; ask  your health care provider or pharmacist if you have questions. What should I tell my health care provider before I take this medicine? They need to know if you have any of these conditions: -anxiety or panic attacks -circulation problems in fingers  and toes -glaucoma -hardening or blockages of the arteries or heart blood vessels -heart disease or a heart defect -high blood pressure -history of a drug or alcohol abuse problem -history of stroke -kidney disease -liver disease -mental illness -seizures -suicidal thoughts, plans, or attempt; a previous suicide attempt by you or a family member -thyroid disease -Tourette's syndrome -an unusual or allergic reaction to dextroamphetamine, other amphetamines, other medicines, foods, dyes, or preservatives -pregnant or trying to get pregnant -breast-feeding How should I use this medicine? Take this medicine by mouth with a glass of water. Follow the directions on the prescription label. This medicine is taken just one time per day, usually in the morning after waking up. Take with or without food. Do not chew or crush this medicine. You may open the capsules and sprinkle the medicine on a spoonful of applesauce. If sprinkled on applesauce, take the dose immediately and do not crush or chew. Always drink a glass of water or other liquid after taking this medicine. Do not take your medicine more often than directed. A special MedGuide will be given to you by the pharmacist with each prescription and refill. Be sure to read this information carefully each time. Talk to your pediatrician regarding the use of this medicine in children. While this drug may be prescribed for children as young as 6 years for selected conditions, precautions do apply. Overdosage: If you think you have taken too much of this medicine contact a poison control center or emergency room at once. NOTE: This medicine is only for you. Do not share this medicine with others. What if I miss a dose? If you miss a dose, take it as soon as you can. If it is almost time for your next dose, take only that dose. Do not take double or extra doses. What may interact with this medicine? Do not take this medicine with any of the following  medications: -MAOIs like Carbex, Eldepryl, Marplan, Nardil, and Parnate -other stimulant medicines for attention disorders, weight loss, or to stay awake This medicine may also interact with the following medications: -acetazolamide -ammonium chloride -antacids -ascorbic acid -atomoxetine -caffeine -certain medicines for blood pressure -certain medicines for depression, anxiety, or psychotic disturbances -certain medicines for diabetes -certain medicines for seizures like carbamazepine, phenobarbital, phenytoin -certain medicines for stomach problems like cimetidine, famotidine, omeprazole, lansoprazole -cold or allergy medicines -glutamic acid -lithium -meperidine -methenamine; sodium acid phosphate -narcotic medicines for pain -norepinephrine -phenothiazines like chlorpromazine, mesoridazine, prochlorperazine, thioridazine -sodium bicarbonate This list may not describe all possible interactions. Give your health care provider a list of all the medicines, herbs, non-prescription drugs, or dietary supplements you use. Also tell them if you smoke, drink alcohol, or use illegal drugs. Some items may interact with your medicine. What should I watch for while using this medicine? Visit your doctor or health care professional for regular checks on your progress. This prescription requires that you follow special procedures with your doctor and pharmacy. You will need to have a new written prescription from your doctor every time you need a refill. This medicine may affect your concentration, or hide signs of tiredness. Until you know how this medicine affects you, do not drive, ride a bicycle, use machinery, or do anything that  needs mental alertness. Tell your doctor or health care professional if this medicine loses its effects, or if you feel you need to take more than the prescribed amount. Do not change the dosage without talking to your doctor or health care professional. Decreased  appetite is a common side effect when starting this medicine. Eating small, frequent meals or snacks can help. Talk to your doctor if you continue to have poor eating habits. Height and weight growth of a child taking this medicine will be monitored closely. Do not take this medicine close to bedtime. It may prevent you from sleeping. If you are going to need surgery, an MRI, a CT scan, or other procedure, tell your doctor that you are taking this medicine. You may need to stop taking this medicine before the procedure. Tell your doctor or healthcare professional right away if you notice unexplained wounds on your fingers and toes while taking this medicine. You should also tell your healthcare provider if you experience numbness or pain, changes in the skin color, or sensitivity to temperature in your fingers or toes. What side effects may I notice from receiving this medicine? Side effects that you should report to your doctor or health care professional as soon as possible: -allergic reactions like skin rash, itching or hives, swelling of the face, lips, or tongue -changes in vision -chest pain or chest tightness -confusion, trouble speaking or understanding -fast, irregular heartbeat -fingers or toes feel numb, cool, painful -hallucination, loss of contact with reality -high blood pressure -males: prolonged or painful erection -seizures -severe headaches -shortness of breath -suicidal thoughts or other mood changes -trouble walking, dizziness, loss of balance or coordination -uncontrollable head, mouth, neck, arm, or leg movements Side effects that usually do not require medical attention (report to your doctor or health care professional if they continue or are bothersome): -anxious -headache -loss of appetite -nausea, vomiting -trouble sleeping -weight loss This list may not describe all possible side effects. Call your doctor for medical advice about side effects. You may report  side effects to FDA at 1-800-FDA-1088. Where should I keep my medicine? Keep out of the reach of children. This medicine can be abused. Keep your medicine in a safe place to protect it from theft. Do not share this medicine with anyone. Selling or giving away this medicine is dangerous and against the law. Store at room temperature between 15 and 30 degrees C (59 and 86 degrees F). Keep container tightly closed. Protect from light. Throw away any unused medicine after the expiration date. NOTE: This sheet is a summary. It may not cover all possible information. If you have questions about this medicine, talk to your doctor, pharmacist, or health care provider.    2016, Elsevier/Gold Standard. (2014-08-15 18:22:45)    [facility] Policy for Prescribing Controlled Substances (Revised 08/2012) 1. Prescriptions for controlled substances will be filled by ONE provider at [facility] with whom you have established and developed a plan for your care, including follow-up. 2. You are encouraged to schedule an appointment with your prescriber at our appointment center for follow-up visits whenever possible. 3. If you request a prescription for the controlled substance while at [facility] for an acute problem (with someone other than your regular prescriber), you MAY be given a ONE-TIME prescription for a 30-day supply of the controlled substance, to allow time for you to return to see your regular prescriber for additional prescriptions.

## 2015-08-14 NOTE — Progress Notes (Signed)
    MRN: [id number] DOB: 12/17/1985  Subjective:   Ryan Spencer is a 29 y.o. male presenting for chief complaint of Fatigue  Reports ~2 month history of intermittent fatigue, difficulty focusing. 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 He works 10-12 hour shifts, 5 days a week. His work can be active at times but has not been strenuous lately. He admits difficulty completing his daily tasks, is forgetful, moves from project to project without finishing or following through. He takes significantly longer than his co-workers to finish his work mostly due to his inability to focus. Patient also reports that his wife has noticed this as of late as well. Of note, patient just had a baby and is now 553 weeks old. The patient reports that his relationship at home is very good. He eats a healthy diet, sleeps approximately 8 hours each night. He has never previously had any issues with ADD or ADHD. Denies smoking cigarettes or alcohol use. Denies any other aggravating or relieving factors, no other questions or concerns.  Ryan Spencer currently has no medications in their medication list. Also is allergic to tramadol.  Ryan Spencer  has a past medical history of MRSA (methicillin resistant staph aureus) culture positive; Abscess; Arthritis; Anxiety; Headache; and Inguinal hernia, bilateral. Also  has past surgical history that includes No past surgeries; Inguinal hernia repair (Bilateral, 11/22/2014); and Insertion of mesh (Bilateral, 11/22/2014).  Objective:   Vitals: BP 106/62 mmHg  Pulse 66  Temp(Src) 98.3 F (36.8 C) (Oral)  Resp 16  Ht 5\' 11"  (1.803 m)  Wt 151 lb (68.493 kg)  BMI 21.07 kg/m2  Physical Exam  Constitutional: He is oriented to person, place, and time. He appears well-developed and well-nourished.  HENT:  Mouth/Throat: Oropharynx is clear and moist.  Eyes: Pupils are equal, round, and reactive to light. No scleral icterus.  Neck: Normal range of motion. Neck supple. No  thyromegaly present.  Cardiovascular: Normal rate, regular rhythm and intact distal pulses.  Exam reveals no gallop and no friction rub.   No murmur heard. Pulmonary/Chest: No respiratory distress. He has no wheezes. He has no rales.  Neurological: He is alert and oriented to person, place, and time.  Skin: Skin is warm and dry. No rash noted. No erythema. No pallor.  Psychiatric: He has a normal mood and affect.   Assessment and Plan :   1. ADD (attention deficit disorder) 2. Other fatigue - Physical exam labs from July 2016 were all normal. Physical exam findings were also unremarkable. Discussed differential with patient and agreed to a trial of Adderall XR. We will start at 10 mg daily except weekends. Discuss potential for adverse affects, patient verbalized understanding. He is to follow up in one month.  Wallis BambergMario Lenny Fiumara, PA-C [facility] [phone] 08/14/2015 8:04 AM

## 2015-09-10 ENCOUNTER — Ambulatory Visit (INDEPENDENT_AMBULATORY_CARE_PROVIDER_SITE_OTHER): Payer: 59 | Admitting: Urgent Care

## 2015-09-10 VITALS — BP 110/70 | HR 62 | Temp 98.3°F | Resp 16 | Ht 72.0 in | Wt 154.0 lb

## 2015-09-10 DIAGNOSIS — F909 Attention-deficit hyperactivity disorder, unspecified type: Secondary | ICD-10-CM

## 2015-09-10 DIAGNOSIS — F988 Other specified behavioral and emotional disorders with onset usually occurring in childhood and adolescence: Secondary | ICD-10-CM

## 2015-09-10 MED ORDER — AMPHETAMINE-DEXTROAMPHET ER 10 MG PO CP24: 10.0000 mg | ORAL_CAPSULE | Freq: Every day | ORAL | Status: DC

## 2015-09-10 MED ORDER — AMPHETAMINE-DEXTROAMPHETAMINE 5 MG PO TABS: 5.0000 mg | ORAL_TABLET | Freq: Every day | ORAL | Status: DC

## 2015-09-10 NOTE — Patient Instructions (Signed)
Amphetamine; Dextroamphetamine extended-release capsules  What is this medicine?  AMPHETAMINE; DEXTROAMPHETAMINE (am FET a meen; dex troe am FET a meen) is used to treat attention-deficit hyperactivity disorder (ADHD). Federal law prohibits giving this medicine to any person other than the person for whom it was prescribed. Do not share this medicine with anyone else.  This medicine may be used for other purposes; ask your health care provider or pharmacist if you have questions.  What should I tell my health care provider before I take this medicine?  They need to know if you have any of these conditions:  -anxiety or panic attacks  -circulation problems in fingers and toes  -glaucoma  -hardening or blockages of the arteries or heart blood vessels  -heart disease or a heart defect  -high blood pressure  -history of a drug or alcohol abuse problem  -history of stroke  -kidney disease  -liver disease  -mental illness  -seizures  -suicidal thoughts, plans, or attempt; a previous suicide attempt by you or a family member  -thyroid disease  -Tourette's syndrome  -an unusual or allergic reaction to dextroamphetamine, other amphetamines, other medicines, foods, dyes, or preservatives  -pregnant or trying to get pregnant  -breast-feeding  How should I use this medicine?  Take this medicine by mouth with a glass of water. Follow the directions on the prescription label. This medicine is taken just one time per day, usually in the morning after waking up. Take with or without food. Do not chew or crush this medicine. You may open the capsules and sprinkle the medicine on a spoonful of applesauce. If sprinkled on applesauce, take the dose immediately and do not crush or chew. Always drink a glass of water or other liquid after taking this medicine. Do not take your medicine more often than directed.  A special MedGuide will be given to you by the pharmacist with each prescription and refill. Be sure to read this information  carefully each time.  Talk to your pediatrician regarding the use of this medicine in children. While this drug may be prescribed for children as young as 6 years for selected conditions, precautions do apply.  Overdosage: If you think you have taken too much of this medicine contact a poison control center or emergency room at once.  NOTE: This medicine is only for you. Do not share this medicine with others.  What if I miss a dose?  If you miss a dose, take it as soon as you can. If it is almost time for your next dose, take only that dose. Do not take double or extra doses.  What may interact with this medicine?  Do not take this medicine with any of the following medications:  -MAOIs like Carbex, Eldepryl, Marplan, Nardil, and Parnate  -other stimulant medicines for attention disorders, weight loss, or to stay awake  This medicine may also interact with the following medications:  -acetazolamide  -ammonium chloride  -antacids  -ascorbic acid  -atomoxetine  -caffeine  -certain medicines for blood pressure  -certain medicines for depression, anxiety, or psychotic disturbances  -certain medicines for diabetes  -certain medicines for seizures like carbamazepine, phenobarbital, phenytoin  -certain medicines for stomach problems like cimetidine, famotidine, omeprazole, lansoprazole  -cold or allergy medicines  -glutamic acid  -lithium  -meperidine  -methenamine; sodium acid phosphate  -narcotic medicines for pain  -norepinephrine  -phenothiazines like chlorpromazine, mesoridazine, prochlorperazine, thioridazine  -sodium bicarbonate  This list may not describe all possible interactions. Give your health   care provider a list of all the medicines, herbs, non-prescription drugs, or dietary supplements you use. Also tell them if you smoke, drink alcohol, or use illegal drugs. Some items may interact with your medicine.  What should I watch for while using this medicine?  Visit your doctor or health care professional for  regular checks on your progress. This prescription requires that you follow special procedures with your doctor and pharmacy. You will need to have a new written prescription from your doctor every time you need a refill.  This medicine may affect your concentration, or hide signs of tiredness. Until you know how this medicine affects you, do not drive, ride a bicycle, use machinery, or do anything that needs mental alertness.  Tell your doctor or health care professional if this medicine loses its effects, or if you feel you need to take more than the prescribed amount. Do not change the dosage without talking to your doctor or health care professional.  Decreased appetite is a common side effect when starting this medicine. Eating small, frequent meals or snacks can help. Talk to your doctor if you continue to have poor eating habits. Height and weight growth of a child taking this medicine will be monitored closely.  Do not take this medicine close to bedtime. It may prevent you from sleeping.  If you are going to need surgery, an MRI, a CT scan, or other procedure, tell your doctor that you are taking this medicine. You may need to stop taking this medicine before the procedure.  Tell your doctor or healthcare professional right away if you notice unexplained wounds on your fingers and toes while taking this medicine. You should also tell your healthcare provider if you experience numbness or pain, changes in the skin color, or sensitivity to temperature in your fingers or toes.  What side effects may I notice from receiving this medicine?  Side effects that you should report to your doctor or health care professional as soon as possible:  -allergic reactions like skin rash, itching or hives, swelling of the face, lips, or tongue  -changes in vision  -chest pain or chest tightness  -confusion, trouble speaking or understanding  -fast, irregular heartbeat  -fingers or toes feel numb, cool,  painful  -hallucination, loss of contact with reality  -high blood pressure  -males: prolonged or painful erection  -seizures  -severe headaches  -shortness of breath  -suicidal thoughts or other mood changes  -trouble walking, dizziness, loss of balance or coordination  -uncontrollable head, mouth, neck, arm, or leg movements  Side effects that usually do not require medical attention (report to your doctor or health care professional if they continue or are bothersome):  -anxious  -headache  -loss of appetite  -nausea, vomiting  -trouble sleeping  -weight loss  This list may not describe all possible side effects. Call your doctor for medical advice about side effects. You may report side effects to FDA at 1-800-FDA-1088.  Where should I keep my medicine?  Keep out of the reach of children. This medicine can be abused. Keep your medicine in a safe place to protect it from theft. Do not share this medicine with anyone. Selling or giving away this medicine is dangerous and against the law.  Store at room temperature between 15 and 30 degrees C (59 and 86 degrees F). Keep container tightly closed. Protect from light. Throw away any unused medicine after the expiration date.  NOTE: This sheet is a summary. It may   not cover all possible information. If you have questions about this medicine, talk to your doctor, pharmacist, or health care provider.     © 2016, [employer]. (2014-08-15 18:22:45)  Amphetamine; Dextroamphetamine tablets  What is this medicine?  AMPHETAMINE; DEXTROAMPHETAMINE(am FET a meen; dex troe am FET a meen) is used to treat attention-deficit hyperactivity disorder (ADHD). It may also be used for narcolepsy. Federal law prohibits giving this medicine to any person other than the person for whom it was prescribed. Do not share this medicine with anyone else.  This medicine may be used for other purposes; ask your health care provider or pharmacist if you have questions.  What should I tell  my health care provider before I take this medicine?  They need to know if you have any of these conditions:  -anxiety or panic attacks  -circulation problems in fingers and toes  -glaucoma  -hardening or blockages of the arteries or heart blood vessels  -heart disease or a heart defect  -high blood pressure  -history of a drug or alcohol abuse problem  -history of stroke  -kidney disease  -liver disease  -mental illness  -seizures  -suicidal thoughts, plans, or attempt; a previous suicide attempt by you or a family member  -thyroid disease  -Tourette's syndrome  -an unusual or allergic reaction to dextroamphetamine, other amphetamines, other medicines, foods, dyes, or preservatives  -pregnant or trying to get pregnant  -breast-feeding  How should I use this medicine?  Take this medicine by mouth with a glass of water. Follow the directions on the prescription label. Take your doses at regular intervals. Do not take your medicine more often than directed. Do not suddenly stop your medicine. You must gradually reduce the dose or you may feel withdrawal effects. Ask your doctor or health care professional for advice.  Talk to your pediatrician regarding the use of this medicine in children. Special care may be needed. While this drug may be prescribed for children as young as 3 years for selected conditions, precautions do apply.  Overdosage: If you think you have taken too much of this medicine contact a poison control center or emergency room at once.  NOTE: This medicine is only for you. Do not share this medicine with others.  What if I miss a dose?  If you miss a dose, take it as soon as you can. If it is almost time for your next dose, take only that dose. Do not take double or extra doses.  What may interact with this medicine?  Do not take this medicine with any of the following medications:  -MAOIS like Carbex, Eldepryl, Marplan, Nardil, and Parnate  -other stimulant medicines for attention disorders, weight  loss, or to stay awake  This medicine may also interact with the following medications:  -acetazolamide  -ammonium chloride  -antacids  -ascorbic acid  -atomoxetine  -caffeine  -certain medicines for blood pressure  -certain medicines for depression, anxiety, or psychotic disturbances  -certain medicines for seizures like carbamazepine, phenobarbital, phenytoin  -certain medicines for stomach problems like cimetidine, famotidine, omeprazole, lansoprazole  -cold or allergy medicines  -glutamic acid  -lithium  -meperidine  -methenamine; sodium acid phosphate  -narcotic medicines for pain  -norepinephrine  -phenothiazines like chlorpromazine, mesoridazine, prochlorperazine, thioridazine  -sodium acid phosphate  -sodium bicarbonate  This list may not describe all possible interactions. Give your health care provider a list of all the medicines, herbs, non-prescription drugs, or dietary supplements you use. Also tell them if   you smoke, drink alcohol, or use illegal drugs. Some items may interact with your medicine.  What should I watch for while using this medicine?  Visit your doctor or health care professional for regular checks on your progress. This prescription requires that you follow special procedures with your doctor and pharmacy. You will need to have a new written prescription from your doctor every time you need a refill.  This medicine may affect your concentration, or hide signs of tiredness. Until you know how this medicine affects you, do not drive, ride a bicycle, use machinery, or do anything that needs mental alertness.  Tell your doctor or health care professional if this medicine loses its effects, or if you feel you need to take more than the prescribed amount. Do not change the dosage without talking to your doctor or health care professional.  Decreased appetite is a common side effect when starting this medicine. Eating small, frequent meals or snacks can help. Talk to your doctor if you continue  to have poor eating habits. Height and weight growth of a child taking this medicine will be monitored closely.  Do not take this medicine close to bedtime. It may prevent you from sleeping.  If you are going to need surgery, a MRI, CT scan, or other procedure, tell your doctor that you are taking this medicine. You may need to stop taking this medicine before the procedure.  Tell your doctor or healthcare professional right away if you notice unexplained wounds on your fingers and toes while taking this medicine. You should also tell your healthcare provider if you experience numbness or pain, changes in the skin color, or sensitivity to temperature in your fingers or toes.  What side effects may I notice from receiving this medicine?  Side effects that you should report to your doctor or health care professional as soon as possible:  -allergic reactions like skin rash, itching or hives, swelling of the face, lips, or tongue  -changes in vision  -chest pain or chest tightness  -confusion, trouble speaking or understanding  -fast, irregular heartbeat  -fingers or toes feel numb, cool, painful  -hallucination, loss of contact with reality  -high blood pressure  -males: prolonged or painful erection  -seizures  -severe headaches  -shortness of breath  -suicidal thoughts or other mood changes  -trouble walking, dizziness, loss of balance or coordination  -uncontrollable head, mouth, neck, arm, or leg movements  Side effects that usually do not require medical attention (report to your doctor or health care professional if they continue or are bothersome):  -anxious  -headache  -loss of appetite  -nausea, vomiting  -trouble sleeping  -weight loss  This list may not describe all possible side effects. Call your doctor for medical advice about side effects. You may report side effects to FDA at 1-800-FDA-1088.  Where should I keep my medicine?  Keep out of the reach of children. This medicine can be abused. Keep your  medicine in a safe place to protect it from theft. Do not share this medicine with anyone. Selling or giving away this medicine is dangerous and against the law.  Store at room temperature between 15 and 30 degrees C (59 and 86 degrees F). Keep container tightly closed. Throw away any unused medicine after the expiration date. Dispose of properly. This medicine may cause accidental overdose and death if it is taken by other adults, children, or pets. Mix any unused medicine with a substance like cat litter or coffee   grounds. Then throw the medicine away in a sealed container like a sealed bag or a coffee can with a lid. Do not use the medicine after the expiration date.  NOTE: This sheet is a summary. It may not cover all possible information. If you have questions about this medicine, talk to your doctor, pharmacist, or health care provider.     © 2016, [employer]. (2014-08-15 18:44:41)

## 2015-09-11 ENCOUNTER — Ambulatory Visit: Payer: 59 | Admitting: Urgent Care

## 2015-09-11 NOTE — Progress Notes (Signed)
    MRN: [id number] DOB: 04/22/1986  Subjective:   Ryan Spencer is a 29 y.o. male presenting for chief complaint of Follow-up  Patient was started on Adderall XR for ADD on 08/14/2015. Patient started the medication, felt adverse effects initially including racing heart beat, insomnia. He was naive to these medications and in the past 2 weeks admits that he no longer has adverse effects. However, he would like to try a smaller dose at mid-day since the effect of XR wears off at about that time. Denies chest pain, shob, decreased appetite, weight loss, irritability. Of note, things are going well at home with his wife and newborn baby. Denies any other aggravating or relieving factors, no other questions or concerns.  Ryan Spencer has a current medication list which includes the following prescription(s): amphetamine-dextroamphetamine and amphetamine-dextroamphetamine. Also is allergic to tramadol.  Ryan Spencer  has a past medical history of MRSA (methicillin resistant staph aureus) culture positive; Abscess; Arthritis; Anxiety; Headache; and Inguinal hernia, bilateral. Also  has past surgical history that includes No past surgeries; Inguinal hernia repair (Bilateral, 11/22/2014); and Insertion of mesh (Bilateral, 11/22/2014).  Objective:   Vitals: BP 110/70 mmHg  Pulse 62  Temp(Src) 98.3 F (36.8 C) (Oral)  Resp 16  Ht 6' (1.829 m)  Wt 154 lb (69.854 kg)  BMI 20.88 kg/m2  SpO2 98%  Physical Exam  Constitutional: He is oriented to person, place, and time. He appears well-developed and well-nourished.  HENT:  Mouth/Throat: Oropharynx is clear and moist.  Eyes: Pupils are equal, round, and reactive to light. No scleral icterus.  Cardiovascular: Normal rate, regular rhythm and intact distal pulses.  Exam reveals no gallop and no friction rub.   No murmur heard. Pulmonary/Chest: No respiratory distress. He has no wheezes. He has no rales.  Neurological: He is alert and oriented to person, place,  and time.  Skin: Skin is warm and dry. No rash noted. No erythema. No pallor.   Assessment and Plan :   1. ADD (attention deficit disorder) - Continue Adderall XR, start Adderall 5mg  mid-day. F/u in 3 months.  Wallis BambergMario Dshaun Reppucci, PA-C [facility] [phone] 09/11/2015 11:27 AM

## 2015-11-08 ENCOUNTER — Telehealth: Payer: Self-pay | Admitting: Family Medicine

## 2015-11-08 NOTE — Telephone Encounter (Signed)
[facility] has been cancelled and to call us back to reschedule

## 2015-11-09 ENCOUNTER — Telehealth: Payer: Self-pay | Admitting: Family Medicine

## 2015-11-09 NOTE — Telephone Encounter (Signed)
lmom to reschedule his time with [facility] on 12-12-15 from evening to morning i also spoke with wife and she states that he works out of town it would be easier for him to work evening time pt wife will get him to call us back for e reschedule

## 2015-11-14 MED FILL — DEXTROAMP-AMPHET ER 10 MG C: 10 | 30 days supply | Qty: 30 | Fill #0

## 2015-11-14 MED FILL — AMPHETAMINE SALTS 5 MG TAB: 5 | 35 days supply | Qty: 35 | Fill #0

## 2015-12-05 ENCOUNTER — Ambulatory Visit: Payer: 59 | Admitting: Urgent Care

## 2015-12-05 ENCOUNTER — Ambulatory Visit (INDEPENDENT_AMBULATORY_CARE_PROVIDER_SITE_OTHER): Payer: 59 | Admitting: Urgent Care

## 2015-12-05 VITALS — BP 128/62 | HR 84 | Temp 97.9°F | Resp 18 | Ht 71.65 in | Wt 148.4 lb

## 2015-12-05 DIAGNOSIS — F988 Other specified behavioral and emotional disorders with onset usually occurring in childhood and adolescence: Secondary | ICD-10-CM

## 2015-12-05 DIAGNOSIS — F909 Attention-deficit hyperactivity disorder, unspecified type: Secondary | ICD-10-CM | POA: Diagnosis not present

## 2015-12-05 MED ORDER — AMPHETAMINE-DEXTROAMPHETAMINE 10 MG PO TABS: 10.0000 mg | ORAL_TABLET | Freq: Two times a day (BID) | ORAL | Status: DC

## 2015-12-05 MED FILL — AMPHETAMINE SALTS 10 MG TAB: 10 | 30 days supply | Qty: 60 | Fill #0

## 2015-12-05 NOTE — Progress Notes (Signed)
    MRN: [id number] DOB: [birth year]  Subjective:   Ryan Spencer is a 30 y.o. male presenting for chief complaint of Follow-up  Reports that he feels no difference with Adderall XR and the new Adderall  in the afternoon. He has had to take the  earlier because he does not feel the effects of the  of Adderall XR. Admits that occasionally he will have heart racing. Denies irritability, decreased appetite, palpitations, shakiness, insomnia. He is going to [place] for work and will be there through the end of the month. Things are otherwise going well with his baby boy.  Ryan Spencer has a current medication list which includes the following prescription(s): amphetamine-dextroamphetamine and amphetamine-dextroamphetamine. Also is allergic to tramadol.  Crit  has a past medical history of MRSA (methicillin resistant staph aureus) culture positive; Abscess; Arthritis; Anxiety; Headache; and Inguinal hernia, bilateral. Also  has past surgical history that includes No past surgeries; Inguinal hernia repair (Bilateral, 11/22/2014); and Insertion of mesh (Bilateral, 11/22/2014).  Objective:   Vitals: BP 128/62 mmHg  Pulse 84  Temp(Src) 97.9 F (36.6 C) (Oral)  Resp 18  Ht 5' 11.65" (1.82 m)  Wt 148 lb 6.4 oz (67.314 kg)  BMI 20.32 kg/m2  SpO2 98%  Physical Exam  Constitutional: He is oriented to person, place, and time. He appears well-developed and well-nourished.  HENT:  Mouth/Throat: Oropharynx is clear and moist.  Eyes: Pupils are equal, round, and reactive to light.  Neck: Normal range of motion. Neck supple. No thyromegaly present.  Cardiovascular: Normal rate, regular rhythm and intact distal pulses.  Exam reveals no gallop and no friction rub.   No murmur heard. Pulmonary/Chest: No respiratory distress. He has no wheezes. He has no rales.  Neurological: He is alert and oriented to person, place, and time.  Skin: Skin is warm and dry.  Psychiatric: He has a normal mood and  affect.   Assessment and Plan :   1. ADD (attention deficit disorder) - Start Adderall  twice daily. Follow up in 3 months or sooner if necessary.  Wallis Bamberg, PA-C [facility] [phone] 12/05/2015 9:31 AM

## 2015-12-05 NOTE — Patient Instructions (Signed)
Amphetamine; Dextroamphetamine tablets What is this medicine? AMPHETAMINE; DEXTROAMPHETAMINE(am FET a meen; dex troe am FET a meen) is used to treat attention-deficit hyperactivity disorder (ADHD). It may also be used for narcolepsy. Federal law prohibits giving this medicine to any person other than the person for whom it was prescribed. Do not share this medicine with anyone else. This medicine may be used for other purposes; ask your health care provider or pharmacist if you have questions. What should I tell my health care provider before I take this medicine? They need to know if you have any of these conditions: -anxiety or panic attacks -circulation problems in fingers and toes -glaucoma -hardening or blockages of the arteries or heart blood vessels -heart disease or a heart defect -high blood pressure -history of a drug or alcohol abuse problem -history of stroke -kidney disease -liver disease -mental illness -seizures -suicidal thoughts, plans, or attempt; a previous suicide attempt by you or a family member -thyroid disease -Tourette's syndrome -an unusual or allergic reaction to dextroamphetamine, other amphetamines, other medicines, foods, dyes, or preservatives -pregnant or trying to get pregnant -breast-feeding How should I use this medicine? Take this medicine by mouth with a glass of water. Follow the directions on the prescription label. Take your doses at regular intervals. Do not take your medicine more often than directed. Do not suddenly stop your medicine. You must gradually reduce the dose or you may feel withdrawal effects. Ask your doctor or health care professional for advice. Talk to your pediatrician regarding the use of this medicine in children. Special care may be needed. While this drug may be prescribed for children as young as 3 years for selected conditions, precautions do apply. Overdosage: If you think you have taken too much of this medicine contact a  poison control center or emergency room at once. NOTE: This medicine is only for you. Do not share this medicine with others. What if I miss a dose? If you miss a dose, take it as soon as you can. If it is almost time for your next dose, take only that dose. Do not take double or extra doses. What may interact with this medicine? Do not take this medicine with any of the following medications: -MAOIS like Carbex, Eldepryl, Marplan, Nardil, and Parnate -other stimulant medicines for attention disorders, weight loss, or to stay awake This medicine may also interact with the following medications: -acetazolamide -ammonium chloride -antacids -ascorbic acid -atomoxetine -caffeine -certain medicines for blood pressure -certain medicines for depression, anxiety, or psychotic disturbances -certain medicines for seizures like carbamazepine, phenobarbital, phenytoin -certain medicines for stomach problems like cimetidine, famotidine, omeprazole, lansoprazole -cold or allergy medicines -glutamic acid -lithium -meperidine -methenamine; sodium acid phosphate -narcotic medicines for pain -norepinephrine -phenothiazines like chlorpromazine, mesoridazine, prochlorperazine, thioridazine -sodium acid phosphate -sodium bicarbonate This list may not describe all possible interactions. Give your health care provider a list of all the medicines, herbs, non-prescription drugs, or dietary supplements you use. Also tell them if you smoke, drink alcohol, or use illegal drugs. Some items may interact with your medicine. What should I watch for while using this medicine? Visit your doctor or health care professional for regular checks on your progress. This prescription requires that you follow special procedures with your doctor and pharmacy. You will need to have a new written prescription from your doctor every time you need a refill. This medicine may affect your concentration, or hide signs of tiredness.  Until you know how this medicine affects you, do   not drive, ride a bicycle, use machinery, or do anything that needs mental alertness. Tell your doctor or health care professional if this medicine loses its effects, or if you feel you need to take more than the prescribed amount. Do not change the dosage without talking to your doctor or health care professional. Decreased appetite is a common side effect when starting this medicine. Eating small, frequent meals or snacks can help. Talk to your doctor if you continue to have poor eating habits. Height and weight growth of a child taking this medicine will be monitored closely. Do not take this medicine close to bedtime. It may prevent you from sleeping. If you are going to need surgery, a MRI, CT scan, or other procedure, tell your doctor that you are taking this medicine. You may need to stop taking this medicine before the procedure. Tell your doctor or healthcare professional right away if you notice unexplained wounds on your fingers and toes while taking this medicine. You should also tell your healthcare provider if you experience numbness or pain, changes in the skin color, or sensitivity to temperature in your fingers or toes. What side effects may I notice from receiving this medicine? Side effects that you should report to your doctor or health care professional as soon as possible: -allergic reactions like skin rash, itching or hives, swelling of the face, lips, or tongue -changes in vision -chest pain or chest tightness -confusion, trouble speaking or understanding -fast, irregular heartbeat -fingers or toes feel numb, cool, painful -hallucination, loss of contact with reality -high blood pressure -males: prolonged or painful erection -seizures -severe headaches -shortness of breath -suicidal thoughts or other mood changes -trouble walking, dizziness, loss of balance or coordination -uncontrollable head, mouth, neck, arm, or leg  movements Side effects that usually do not require medical attention (report to your doctor or health care professional if they continue or are bothersome): -anxious -headache -loss of appetite -nausea, vomiting -trouble sleeping -weight loss This list may not describe all possible side effects. Call your doctor for medical advice about side effects. You may report side effects to FDA at 1-800-FDA-1088. Where should I keep my medicine? Keep out of the reach of children. This medicine can be abused. Keep your medicine in a safe place to protect it from theft. Do not share this medicine with anyone. Selling or giving away this medicine is dangerous and against the law. Store at room temperature between 15 and 30 degrees C (59 and 86 degrees F). Keep container tightly closed. Throw away any unused medicine after the expiration date. Dispose of properly. This medicine may cause accidental overdose and death if it is taken by other adults, children, or pets. Mix any unused medicine with a substance like cat litter or coffee grounds. Then throw the medicine away in a sealed container like a sealed bag or a coffee can with a lid. Do not use the medicine after the expiration date. NOTE: This sheet is a summary. It may not cover all possible information. If you have questions about this medicine, talk to your doctor, pharmacist, or health care provider.    2016, Elsevier/Gold Standard. (2014-08-15 18:44:41)  

## 2015-12-12 ENCOUNTER — Ambulatory Visit: Payer: 59 | Admitting: Urgent Care

## 2016-01-03 MED FILL — AMPHETAMINE SALTS 10 MG TAB: 10 | 30 days supply | Qty: 60 | Fill #0

## 2016-02-03 ENCOUNTER — Ambulatory Visit (INDEPENDENT_AMBULATORY_CARE_PROVIDER_SITE_OTHER): Payer: 59 | Admitting: Urgent Care

## 2016-02-03 VITALS — BP 128/60 | HR 80 | Temp 98.3°F | Resp 16 | Ht 71.0 in | Wt 144.0 lb

## 2016-02-03 DIAGNOSIS — F909 Attention-deficit hyperactivity disorder, unspecified type: Secondary | ICD-10-CM | POA: Diagnosis not present

## 2016-02-03 DIAGNOSIS — J0100 Acute maxillary sinusitis, unspecified: Secondary | ICD-10-CM

## 2016-02-03 DIAGNOSIS — F988 Other specified behavioral and emotional disorders with onset usually occurring in childhood and adolescence: Secondary | ICD-10-CM

## 2016-02-03 DIAGNOSIS — J302 Other seasonal allergic rhinitis: Secondary | ICD-10-CM

## 2016-02-03 MED ORDER — CETIRIZINE HCL 10 MG PO TABS: 10.0000 mg | ORAL_TABLET | Freq: Every day | ORAL | Status: DC

## 2016-02-03 MED ORDER — AMPHETAMINE-DEXTROAMPHETAMINE 10 MG PO TABS: 10.0000 mg | ORAL_TABLET | Freq: Two times a day (BID) | ORAL | Status: DC

## 2016-02-03 MED ORDER — PSEUDOEPHEDRINE HCL ER 120 MG PO TB12: 120.0000 mg | ORAL_TABLET | Freq: Two times a day (BID) | ORAL | Status: DC

## 2016-02-03 MED ORDER — AMOXICILLIN 875 MG PO TABS: 875.0000 mg | ORAL_TABLET | Freq: Two times a day (BID) | ORAL | Status: AC

## 2016-02-03 MED FILL — AMPHETAMINE SALTS 10 MG TAB: 10 | 30 days supply | Qty: 60 | Fill #0

## 2016-02-03 MED FILL — AMOXICILLIN 875 MG TABLET: 875 | 10 days supply | Qty: 20 | Fill #0

## 2016-02-03 NOTE — Progress Notes (Signed)
    MRN: [id number] DOB: 05/07/1986  Subjective:   Ryan Spencer is a 30 y.o. male presenting for chief complaint of Sinusitis and Headache  Sinus Pain - Reports 5 day history of sinus congestion, sinus pain, bilateral ear pressure, mild sore throat. Has been taking Mucinex with minimal relief. Admits history of mild seasonal allergies, usually resolves with Claritin. However, he has not started that this season. Smokes occasional cigarette. Denies cough, chest pain, shob, fever.   ADD - Reports that he lost his last prescription for his third month refill of Adderall 10mg  BID. Admits occasional heart racing. ROS as above. Denies irritability, insomnia, mood changes. States that he does well with his ADD.   Ryan Spencer has a current medication list which includes the following prescription(s): amphetamine-dextroamphetamine and fexofenadine hcl. Also is allergic to tramadol.  Ryan Spencer  has a past medical history of MRSA (methicillin resistant staph aureus) culture positive; Abscess; Arthritis; Anxiety; Headache; and Inguinal hernia, bilateral. Also  has past surgical history that includes No past surgeries; Inguinal hernia repair (Bilateral, 11/22/2014); and Insertion of mesh (Bilateral, 11/22/2014).  Objective:   Vitals: BP 128/60 mmHg  Pulse 80  Temp(Src) 98.3 F (36.8 C) (Oral)  Resp 16  Ht 5\' 11"  (1.803 m)  Wt 144 lb (65.318 kg)  BMI 20.09 kg/m2  SpO2 98%  Physical Exam  Constitutional: He is oriented to person, place, and time. He appears well-developed and well-nourished.  HENT:  TM's flat bilaterally, no effusions or erythema. Right nasal turbinate erythematous with right-sided maxillary sinus tenderness. Postnasal drip present, without oropharyngeal exudates, erythema or abscesses.  Eyes: Right eye exhibits no discharge. Left eye exhibits no discharge. No scleral icterus.  Neck: Normal range of motion. Neck supple.  Cardiovascular: Normal rate, regular rhythm and intact distal  pulses.  Exam reveals no gallop and no friction rub.   No murmur heard. Pulmonary/Chest: No respiratory distress. He has no wheezes. He has no rales.  Lymphadenopathy:    He has no cervical adenopathy.  Neurological: He is alert and oriented to person, place, and time.  Skin: Skin is warm and dry.   Assessment and Plan :   1. Acute maxillary sinusitis, recurrence not specified - Start Amoxicillin for sinusitis secondary to uncontrolled allergies. RTC in 1 week if no improvement.  2. Seasonal allergies - Take Zyrtec and Sudafed for better control of allergies.  3. ADD (attention deficit disorder) - Refills provided for 3 months. Recheck at that time.  Wallis BambergMario Veryl Winemiller, PA-C [facility] [phone] 02/03/2016 3:11 PM

## 2016-02-03 NOTE — Patient Instructions (Addendum)
Sinusitis, Adult Sinusitis is redness, soreness, and inflammation of the paranasal sinuses. Paranasal sinuses are air pockets within the bones of your face. They are located beneath your eyes, in the middle of your forehead, and above your eyes. In healthy paranasal sinuses, mucus is able to drain out, and air is able to circulate through them by way of your nose. However, when your paranasal sinuses are inflamed, mucus and air can become trapped. This can allow bacteria and other germs to grow and cause infection. Sinusitis can develop quickly and last only a short time (acute) or continue over a long period (chronic). Sinusitis that lasts for more than 12 weeks is considered chronic. CAUSES Causes of sinusitis include:  Allergies.  Structural abnormalities, such as displacement of the cartilage that separates your nostrils (deviated septum), which can decrease the air flow through your nose and sinuses and affect sinus drainage.  Functional abnormalities, such as when the small hairs (cilia) that line your sinuses and help remove mucus do not work properly or are not present. SIGNS AND SYMPTOMS Symptoms of acute and chronic sinusitis are the same. The primary symptoms are pain and pressure around the affected sinuses. Other symptoms include:  Upper toothache.  Earache.  Headache.  Bad breath.  Decreased sense of smell and taste.  A cough, which worsens when you are lying flat.  Fatigue.  Fever.  Thick drainage from your nose, which often is green and may contain pus (purulent).  Swelling and warmth over the affected sinuses. DIAGNOSIS Your health care provider will perform a physical exam. During your exam, your health care provider may perform any of the following to help determine if you have acute sinusitis or chronic sinusitis:  Look in your nose for signs of abnormal growths in your nostrils (nasal polyps).  Tap over the affected sinus to check for signs of  infection.  View the inside of your sinuses using an imaging device that has a light attached (endoscope). If your health care provider suspects that you have chronic sinusitis, one or more of the following tests may be recommended:  Allergy tests.  Nasal culture. A sample of mucus is taken from your nose, sent to a lab, and screened for bacteria.  Nasal cytology. A sample of mucus is taken from your nose and examined by your health care provider to determine if your sinusitis is related to an allergy. TREATMENT Most cases of acute sinusitis are related to a viral infection and will resolve on their own within 10 days. Sometimes, medicines are prescribed to help relieve symptoms of both acute and chronic sinusitis. These may include pain medicines, decongestants, nasal steroid sprays, or saline sprays. However, for sinusitis related to a bacterial infection, your health care provider will prescribe antibiotic medicines. These are medicines that will help kill the bacteria causing the infection. Rarely, sinusitis is caused by a fungal infection. In these cases, your health care provider will prescribe antifungal medicine. For some cases of chronic sinusitis, surgery is needed. Generally, these are cases in which sinusitis recurs more than 3 times per year, despite other treatments. HOME CARE INSTRUCTIONS  Drink plenty of water. Water helps thin the mucus so your sinuses can drain more easily.  Use a humidifier.  Inhale steam 3-4 times a day (for example, sit in the bathroom with the shower running).  Apply a warm, moist washcloth to your face 3-4 times a day, or as directed by your health care provider.  Use saline nasal sprays to help   moisten and clean your sinuses.  Take medicines only as directed by your health care provider.  If you were prescribed either an antibiotic or antifungal medicine, finish it all even if you start to feel better. SEEK IMMEDIATE MEDICAL CARE IF:  You have  increasing pain or severe headaches.  You have nausea, vomiting, or drowsiness.  You have swelling around your face.  You have vision problems.  You have a stiff neck.  You have difficulty breathing.   This information is not intended to replace advice given to you by your health care provider. Make sure you discuss any questions you have with your health care provider.   Document Released: 10/12/2005 Document Revised: 11/02/2014 Document Reviewed: 10/27/2011 Elsevier Interactive Patient Education 2016 [employer]   Allergies An allergy is an abnormal reaction to a substance by the body's defense system (immune system). Allergies can develop at any age. WHAT CAUSES ALLERGIES? An allergic reaction happens when the immune system mistakenly reacts to a normally harmless substance, called an allergen, as if it were harmful. The immune system releases antibodies to fight the substance. Antibodies eventually release a chemical called histamine into the bloodstream. The release of histamine is meant to protect the body from infection, but it also causes discomfort. An allergic reaction can be triggered by:  Eating an allergen.  Inhaling an allergen.  Touching an allergen. WHAT TYPES OF ALLERGIES ARE THERE? There are many types of allergies. Common types include:  Seasonal allergies. People with this type of allergy are usually allergic to substances that are only present during certain seasons, such as molds and pollens.  Food allergies.  Drug allergies.  Insect allergies.  Animal dander allergies. WHAT ARE SYMPTOMS OF ALLERGIES? Possible allergy symptoms include:  Swelling of the lips, face, tongue, mouth, or throat.  Sneezing, coughing, or wheezing.  Nasal congestion.  Tingling in the mouth.  Rash.  Itching.  Itchy, red, swollen areas of skin (hives).  Watery eyes.  Vomiting.  Diarrhea.  Dizziness.  Lightheadedness.  Fainting.  Trouble breathing or  swallowing.  Chest tightness.  Rapid heartbeat. HOW ARE ALLERGIES DIAGNOSED? Allergies are diagnosed with a medical and family history and one or more of the following:  Skin tests.  Blood tests.  A food diary. A food diary is a record of all the foods and drinks you have in a day and of all the symptoms you experience.  The results of an elimination diet. An elimination diet involves eliminating foods from your diet and then adding them back in one by one to find out if a certain food causes an allergic reaction. HOW ARE ALLERGIES TREATED? There is no cure for allergies, but allergic reactions can be treated with medicine. Severe reactions usually need to be treated at a hospital. HOW CAN REACTIONS BE PREVENTED? The best way to prevent an allergic reaction is by avoiding the substance you are allergic to. Allergy shots and medicines can also help prevent reactions in some cases. People with severe allergic reactions may be able to prevent a life-threatening reaction called anaphylaxis with a medicine given right after exposure to the allergen.   This information is not intended to replace advice given to you by your health care provider. Make sure you discuss any questions you have with your health care provider.   Document Released: 01/05/2003 Document Revised: 11/02/2014 Document Reviewed: 07/24/2014 Elsevier Interactive Patient Education [employer]2016 Elsevier Inc.   IF you received an x-ray today, you will receive an invoice from [facility]  receive an invoice from [facility]. Please contact [facility] at [phone] with questions or concerns regarding your invoice.   IF you received labwork today, you will receive an invoice from [facility]. Please contact [facility] at [phone] with questions or concerns regarding your invoice.   Our billing staff will not be able to assist you with questions regarding bills from these companies.  You will be contacted with the  lab results as soon as they are available. The fastest way to get your results is to activate your My Chart account. Instructions are located on the last page of this paperwork. If you have not heard from us regarding the results in 2 weeks, please contact this office.      

## 2016-02-25 ENCOUNTER — Telehealth: Payer: BLUE CROSS/BLUE SHIELD | Admitting: Family

## 2016-02-25 DIAGNOSIS — L237 Allergic contact dermatitis due to plants, except food: Secondary | ICD-10-CM

## 2016-02-25 MED ORDER — PREDNISONE 20 MG PO TABS: 20.0000 mg | ORAL_TABLET | Freq: Two times a day (BID) | ORAL | Status: DC

## 2016-02-25 MED FILL — predniSONE 20 MG TABS: 20 | 5 days supply | Qty: 10 | Fill #0

## 2016-02-25 NOTE — Progress Notes (Signed)

## 2016-03-31 DIAGNOSIS — F1120 Opioid dependence, uncomplicated: Secondary | ICD-10-CM | POA: Diagnosis not present

## 2016-04-01 DIAGNOSIS — R5382 Chronic fatigue, unspecified: Secondary | ICD-10-CM | POA: Diagnosis not present

## 2016-04-01 DIAGNOSIS — F1920 Other psychoactive substance dependence, uncomplicated: Secondary | ICD-10-CM | POA: Diagnosis not present

## 2016-04-01 DIAGNOSIS — F1120 Opioid dependence, uncomplicated: Secondary | ICD-10-CM | POA: Diagnosis not present

## 2016-04-01 DIAGNOSIS — M1990 Unspecified osteoarthritis, unspecified site: Secondary | ICD-10-CM | POA: Diagnosis not present

## 2016-04-01 DIAGNOSIS — F988 Other specified behavioral and emotional disorders with onset usually occurring in childhood and adolescence: Secondary | ICD-10-CM | POA: Diagnosis not present

## 2016-04-01 DIAGNOSIS — F19980 Other psychoactive substance use, unspecified with psychoactive substance-induced anxiety disorder: Secondary | ICD-10-CM | POA: Diagnosis not present

## 2016-04-01 MED FILL — SUBOXONE 8 MG-2 MG SL FILM: 8-2 | 7 days supply | Qty: 14 | Fill #0

## 2016-04-08 DIAGNOSIS — F1920 Other psychoactive substance dependence, uncomplicated: Secondary | ICD-10-CM | POA: Diagnosis not present

## 2016-04-08 DIAGNOSIS — F1120 Opioid dependence, uncomplicated: Secondary | ICD-10-CM | POA: Diagnosis not present

## 2016-04-08 DIAGNOSIS — F988 Other specified behavioral and emotional disorders with onset usually occurring in childhood and adolescence: Secondary | ICD-10-CM | POA: Diagnosis not present

## 2016-04-08 DIAGNOSIS — F19980 Other psychoactive substance use, unspecified with psychoactive substance-induced anxiety disorder: Secondary | ICD-10-CM | POA: Diagnosis not present

## 2016-04-08 DIAGNOSIS — M1990 Unspecified osteoarthritis, unspecified site: Secondary | ICD-10-CM | POA: Diagnosis not present

## 2016-04-15 ENCOUNTER — Telehealth: Payer: BLUE CROSS/BLUE SHIELD | Admitting: Family

## 2016-04-15 DIAGNOSIS — J019 Acute sinusitis, unspecified: Secondary | ICD-10-CM

## 2016-04-15 MED ORDER — AMOXICILLIN-POT CLAVULANATE 875-125 MG PO TABS: 1.0000 | ORAL_TABLET | Freq: Two times a day (BID) | ORAL | Status: DC

## 2016-04-15 MED FILL — AMOX TR-K CLV 875-125 MG TA: 875-125 | 7 days supply | Qty: 14 | Fill #0

## 2016-04-15 NOTE — Progress Notes (Signed)

## 2016-04-16 DIAGNOSIS — F1920 Other psychoactive substance dependence, uncomplicated: Secondary | ICD-10-CM | POA: Diagnosis not present

## 2016-04-16 DIAGNOSIS — F1120 Opioid dependence, uncomplicated: Secondary | ICD-10-CM | POA: Diagnosis not present

## 2016-04-16 DIAGNOSIS — F19980 Other psychoactive substance use, unspecified with psychoactive substance-induced anxiety disorder: Secondary | ICD-10-CM | POA: Diagnosis not present

## 2016-04-16 DIAGNOSIS — F988 Other specified behavioral and emotional disorders with onset usually occurring in childhood and adolescence: Secondary | ICD-10-CM | POA: Diagnosis not present

## 2016-04-16 DIAGNOSIS — M1990 Unspecified osteoarthritis, unspecified site: Secondary | ICD-10-CM | POA: Diagnosis not present

## 2016-04-23 DIAGNOSIS — F988 Other specified behavioral and emotional disorders with onset usually occurring in childhood and adolescence: Secondary | ICD-10-CM | POA: Diagnosis not present

## 2016-04-23 DIAGNOSIS — F1920 Other psychoactive substance dependence, uncomplicated: Secondary | ICD-10-CM | POA: Diagnosis not present

## 2016-04-23 DIAGNOSIS — F1120 Opioid dependence, uncomplicated: Secondary | ICD-10-CM | POA: Diagnosis not present

## 2016-04-23 DIAGNOSIS — F19980 Other psychoactive substance use, unspecified with psychoactive substance-induced anxiety disorder: Secondary | ICD-10-CM | POA: Diagnosis not present

## 2016-04-23 DIAGNOSIS — M1990 Unspecified osteoarthritis, unspecified site: Secondary | ICD-10-CM | POA: Diagnosis not present

## 2016-04-30 DIAGNOSIS — F1120 Opioid dependence, uncomplicated: Secondary | ICD-10-CM | POA: Diagnosis not present

## 2016-04-30 DIAGNOSIS — F1920 Other psychoactive substance dependence, uncomplicated: Secondary | ICD-10-CM | POA: Diagnosis not present

## 2016-04-30 DIAGNOSIS — Z6820 Body mass index (BMI) 20.0-20.9, adult: Secondary | ICD-10-CM | POA: Diagnosis not present

## 2016-04-30 DIAGNOSIS — M1990 Unspecified osteoarthritis, unspecified site: Secondary | ICD-10-CM | POA: Diagnosis not present

## 2016-04-30 DIAGNOSIS — F988 Other specified behavioral and emotional disorders with onset usually occurring in childhood and adolescence: Secondary | ICD-10-CM | POA: Diagnosis not present

## 2016-04-30 DIAGNOSIS — F19980 Other psychoactive substance use, unspecified with psychoactive substance-induced anxiety disorder: Secondary | ICD-10-CM | POA: Diagnosis not present

## 2016-05-05 ENCOUNTER — Other Ambulatory Visit: Payer: Self-pay

## 2016-05-05 NOTE — Telephone Encounter (Signed)
adderall refill  [facility]  [phone]

## 2016-05-07 MED ORDER — AMPHETAMINE-DEXTROAMPHETAMINE 10 MG PO TABS
10.0000 mg | ORAL_TABLET | Freq: Two times a day (BID) | ORAL | Status: DC
Start: 2016-05-07 — End: 2016-05-07

## 2016-05-07 MED ORDER — AMPHETAMINE-DEXTROAMPHETAMINE 10 MG PO TABS: 10.0000 mg | ORAL_TABLET | Freq: Two times a day (BID) | ORAL | Status: DC

## 2016-05-07 NOTE — Telephone Encounter (Signed)
Patient has been stable. Follows instructions with Adderall. Another 3 month refill provided. F/u in clinic thereafter.

## 2016-05-14 DIAGNOSIS — F1120 Opioid dependence, uncomplicated: Secondary | ICD-10-CM | POA: Diagnosis not present

## 2016-05-14 DIAGNOSIS — F19980 Other psychoactive substance use, unspecified with psychoactive substance-induced anxiety disorder: Secondary | ICD-10-CM | POA: Diagnosis not present

## 2016-05-14 DIAGNOSIS — M1990 Unspecified osteoarthritis, unspecified site: Secondary | ICD-10-CM | POA: Diagnosis not present

## 2016-05-14 DIAGNOSIS — F988 Other specified behavioral and emotional disorders with onset usually occurring in childhood and adolescence: Secondary | ICD-10-CM | POA: Diagnosis not present

## 2016-05-14 DIAGNOSIS — F1920 Other psychoactive substance dependence, uncomplicated: Secondary | ICD-10-CM | POA: Diagnosis not present

## 2016-05-14 DIAGNOSIS — Z6820 Body mass index (BMI) 20.0-20.9, adult: Secondary | ICD-10-CM | POA: Diagnosis not present

## 2016-05-14 NOTE — Telephone Encounter (Signed)
LMOM for pt that Rxs are ready and need to f/up for next RFs after these 3.

## 2016-05-26 ENCOUNTER — Encounter: Payer: Self-pay | Admitting: Urgent Care

## 2016-05-27 ENCOUNTER — Other Ambulatory Visit: Payer: Self-pay | Admitting: Urgent Care

## 2016-05-27 MED ORDER — SCOPOLAMINE 1 MG/3DAYS TD PT72: 1.0000 | MEDICATED_PATCH | TRANSDERMAL | 1 refills | Status: DC

## 2016-05-28 DIAGNOSIS — F19980 Other psychoactive substance use, unspecified with psychoactive substance-induced anxiety disorder: Secondary | ICD-10-CM | POA: Diagnosis not present

## 2016-05-28 DIAGNOSIS — F1120 Opioid dependence, uncomplicated: Secondary | ICD-10-CM | POA: Diagnosis not present

## 2016-05-28 DIAGNOSIS — F1920 Other psychoactive substance dependence, uncomplicated: Secondary | ICD-10-CM | POA: Diagnosis not present

## 2016-05-28 DIAGNOSIS — Z6820 Body mass index (BMI) 20.0-20.9, adult: Secondary | ICD-10-CM | POA: Diagnosis not present

## 2016-05-28 DIAGNOSIS — F988 Other specified behavioral and emotional disorders with onset usually occurring in childhood and adolescence: Secondary | ICD-10-CM | POA: Diagnosis not present

## 2016-05-28 DIAGNOSIS — M1990 Unspecified osteoarthritis, unspecified site: Secondary | ICD-10-CM | POA: Diagnosis not present

## 2016-05-28 MED FILL — SUBOXONE 8 MG-2 MG SL FILM: 8-2 | 14 days supply | Qty: 28 | Fill #0

## 2016-05-29 ENCOUNTER — Other Ambulatory Visit: Payer: Self-pay | Admitting: Urgent Care

## 2016-06-01 MED FILL — SCOPOLAMINE 1 MG/3 DAY PATC: 1 | 9 days supply | Qty: 3 | Fill #0

## 2016-06-12 DIAGNOSIS — F1920 Other psychoactive substance dependence, uncomplicated: Secondary | ICD-10-CM | POA: Diagnosis not present

## 2016-06-12 DIAGNOSIS — M1990 Unspecified osteoarthritis, unspecified site: Secondary | ICD-10-CM | POA: Diagnosis not present

## 2016-06-12 DIAGNOSIS — F1120 Opioid dependence, uncomplicated: Secondary | ICD-10-CM | POA: Diagnosis not present

## 2016-06-12 DIAGNOSIS — Z6820 Body mass index (BMI) 20.0-20.9, adult: Secondary | ICD-10-CM | POA: Diagnosis not present

## 2016-06-12 DIAGNOSIS — F19980 Other psychoactive substance use, unspecified with psychoactive substance-induced anxiety disorder: Secondary | ICD-10-CM | POA: Diagnosis not present

## 2016-06-12 DIAGNOSIS — F988 Other specified behavioral and emotional disorders with onset usually occurring in childhood and adolescence: Secondary | ICD-10-CM | POA: Diagnosis not present

## 2016-06-14 ENCOUNTER — Emergency Department (HOSPITAL_BASED_OUTPATIENT_CLINIC_OR_DEPARTMENT_OTHER)
Admission: EM | Admit: 2016-06-14 | Discharge: 2016-06-14 | Disposition: A | Discharge status: Home / Self Care | Payer: 59 | Attending: Emergency Medicine | Admitting: Emergency Medicine

## 2016-06-14 ENCOUNTER — Encounter (HOSPITAL_BASED_OUTPATIENT_CLINIC_OR_DEPARTMENT_OTHER): Payer: Self-pay | Admitting: *Deleted

## 2016-06-14 DIAGNOSIS — F17210 Nicotine dependence, cigarettes, uncomplicated: Secondary | ICD-10-CM | POA: Insufficient documentation

## 2016-06-14 DIAGNOSIS — L0231 Cutaneous abscess of buttock: Secondary | ICD-10-CM | POA: Diagnosis present

## 2016-06-14 DIAGNOSIS — Z79899 Other long term (current) drug therapy: Secondary | ICD-10-CM | POA: Diagnosis not present

## 2016-06-14 DIAGNOSIS — L03317 Cellulitis of buttock: Secondary | ICD-10-CM | POA: Diagnosis not present

## 2016-06-14 MED ORDER — DOXYCYCLINE HYCLATE 100 MG PO CAPS: 100.0000 mg | ORAL_CAPSULE | Freq: Two times a day (BID) | ORAL | 0 refills | Status: DC

## 2016-06-14 MED ORDER — HYDROCODONE-ACETAMINOPHEN 5-325 MG PO TABS: 1.0000 | ORAL_TABLET | ORAL | 0 refills | Status: DC | PRN

## 2016-06-14 NOTE — ED Provider Notes (Signed)
[facility] Provider Note   CSN: [id number] Arrival date & time: 06/14/16  [id number]     History   Chief Complaint Chief Complaint  Patient presents with  . Abscess    HPI Ryan Spencer is a 30 y.o. male.  Patient is a 30 year old male with past medical history of abscesses and MRSA infections presents the ED with complaint of abscess, onset 4 days. Patient reports he has had a worsening abscess to his right upper buttocks. He states he has been applying warm compresses but denies having any drainage. Patient states he has been taking Tylenol at home without relief. Endorses associated chills and pain to the surrounding area. Denies fever, abdominal pain, nausea, vomiting, diarrhea, constipation, blood in stool. Denies any recent trauma or injury to the site or use of any needles.       Past Medical History:  Diagnosis Date  . Abscess   . Anxiety    valium in use for dentist procedures   . Arthritis   . Headache    not migraines but get bad headaches sometimes   . Inguinal hernia, bilateral   . MRSA (methicillin resistant staph aureus) culture positive     Patient Active Problem List   Diagnosis Date Noted  . ADD (attention deficit disorder) 08/14/2015  . History of inguinal hernia repair 05/16/2015  . TOBACCO ABUSE 11/19/2008  . Arthralgia 08/07/2008    Past Surgical History:  Procedure Laterality Date  . INGUINAL HERNIA REPAIR Bilateral 11/22/2014   Procedure: BILATERAL OPEN INGUINAL HERNIA REPAIR WITH MESH ;  Surgeon: Harriette Bouillonhomas Cornett, MD;  Location: [facility];  Service: General;  Laterality: Bilateral;  . INSERTION OF MESH Bilateral 11/22/2014   Procedure: INSERTION OF MESH;  Surgeon: Harriette Bouillonhomas Cornett, MD;  Location: [facility] OR;  Service: General;  Laterality: Bilateral;  . NO PAST SURGERIES         Home Medications    Prior to Admission medications   Medication Sig Start Date End Date Taking? Authorizing Provider  amphetamine-dextroamphetamine (ADDERALL)  10 MG tablet Take 1 tablet (10 mg total) by mouth 2 (two) times daily. 05/07/16  Yes Wallis BambergMario Mani, PA-C  cetirizine (ZYRTEC) 10 MG tablet Take 1 tablet (10 mg total) by mouth daily. 02/03/16  Yes Wallis BambergMario Mani, PA-C  amoxicillin-clavulanate (AUGMENTIN) 875-125 MG tablet Take 1 tablet by mouth 2 (two) times daily. 04/15/16   Junie Spencerhristy A Hawks, FNP  doxycycline (VIBRAMYCIN) 100 MG capsule Take 1 capsule (100 mg total) by mouth 2 (two) times daily. 06/14/16   Barrett HenleNicole Elizabeth Pura Picinich, PA-C  Fexofenadine HCl [facility](MUCINEX ALLERGY PO) Take by mouth.    Historical Provider, MD  HYDROcodone-acetaminophen (NORCO/VICODIN) 5-325 MG tablet Take 1 tablet by mouth every 4 (four) hours as needed. 06/14/16   Barrett HenleNicole Elizabeth Brecken Dewoody, PA-C  predniSONE (DELTASONE) 20 MG tablet Take 1 tablet (20 mg total) by mouth 2 (two) times daily with a meal. 02/25/16   Eulis FosterPadonda B Webb, FNP  pseudoephedrine (SUDAFED 12 HOUR) 120 MG 12 hr tablet Take 1 tablet (120 mg total) by mouth 2 (two) times daily. 02/03/16   Wallis BambergMario Mani, PA-C  scopolamine (TRANSDERM-SCOP, 1.5 MG,) 1 MG/3DAYS Place 1 patch (1.5 mg total) onto the skin every 3 (three) days. 05/27/16   Wallis BambergMario Mani, PA-C    Family History Family History  Problem Relation Age of Onset  . Arthritis Mother   . Cancer Maternal Grandmother     lung and lymphoma  . Cancer Paternal Grandmother   . Diabetes Paternal Grandmother   .  Cancer Paternal Grandfather     lymphoma    Social History Social History  Substance Use Topics  . Smoking status: Current Every Day Smoker    Packs/day: 0.50    Years: 14.00    Types: Cigarettes    Last attempt to quit: 05/13/2015  . Smokeless tobacco: Never Used  . Alcohol use 0.0 oz/week     Comment: several times per month     Allergies   Tramadol   Review of Systems Review of Systems  Constitutional: Positive for chills. Negative for fever.  Gastrointestinal: Negative for abdominal pain, blood in stool, constipation, diarrhea, nausea and vomiting.  Skin:  Positive for wound (abscess).     Physical Exam Updated Vital Signs BP 132/72 (BP Location: Left Arm)   Pulse 72   Temp 98 F (36.7 C) (Oral)   Resp 16   Ht 5\' 10"  (1.778 m)   Wt 68 kg   SpO2 100%   BMI 21.52 kg/m   Physical Exam  Constitutional: He is oriented to person, place, and time. He appears well-developed and well-nourished.  HENT:  Head: Normocephalic and atraumatic.  Eyes: Conjunctivae and EOM are normal. Right eye exhibits no discharge. Left eye exhibits no discharge. No scleral icterus.  Neck: Normal range of motion. Neck supple.  Cardiovascular: Normal rate, regular rhythm, normal heart sounds and intact distal pulses.   Pulmonary/Chest: Effort normal and breath sounds normal. No respiratory distress. He has no wheezes. He has no rales. He exhibits no tenderness.  Abdominal: Soft. Bowel sounds are normal. He exhibits no distension and no mass. There is no tenderness. There is no rebound and no guarding. No hernia.  Musculoskeletal: Normal range of motion. He exhibits no edema.  Neurological: He is alert and oriented to person, place, and time.  Skin: Skin is warm and dry.  4x3cm area of swelling, induration and erythema noted to right medial upper buttocks. No warmth, fluctuance or drainage noted. TTP.  Nursing note and vitals reviewed.    ED Treatments / Results  Labs (all labs ordered are listed, but only abnormal results are displayed) Labs Reviewed - No data to display  EKG  EKG Interpretation None       Radiology No results found.  Procedures Procedures (including critical care time)  Medications Ordered in ED Medications - No data to display   Initial Impression / Assessment and Plan / ED Course  I have reviewed the triage vital signs and the nursing notes.  Pertinent labs & imaging results that were available during my care of the patient were reviewed by me and considered in my medical decision making (see chart for  details).  Clinical Course    Patient presents with worsening pain and swelling to right upper buttocks. History of abscesses and MRSA infection. Denies fever or drainage. VSS. Exam revealed area of swelling, induration and erythema to right medial upper buttocks, consistent with cellulitis. No fluctuance or drainage present to warrant an I&D at this time. Chart review shows that patient has had MRSA infections in the past with resistance to Bactrim and clindamycin; and was tx with Doxy. Plan to treat patient with doxycycline twice a day for 10 days since he has had successful resolution of infections in the past. Plan to discharge patient home with pain meds, symptomatically treatment and advised to continue using warm compresses. Advised patient to follow up with his PCP in 2 days for wound recheck. Discussed return precautions with patient.  Final Clinical Impressions(s) /  ED Diagnoses   Final diagnoses:  Cellulitis of buttock    New Prescriptions New Prescriptions   DOXYCYCLINE (VIBRAMYCIN) 100 MG CAPSULE    Take 1 capsule (100 mg total) by mouth 2 (two) times daily.   HYDROCODONE-ACETAMINOPHEN (NORCO/VICODIN) 5-325 MG TABLET    Take 1 tablet by mouth every 4 (four) hours as needed.     Satira Sark [place], [place] 06/14/16 1610    Glynn Octave, MD 06/14/16 [phone]

## 2016-06-14 NOTE — Discharge Instructions (Signed)
Take your antibiotic as prescribed until you've completed the prescription. You may also take pain medication and/or 600 mg ibuprofen every 6 hours as needed for pain relief. I recommend continuing to apply warm compresses to area for 15-20 minutes 3-4 times daily. Follow-up with your primary care provider in 2 days for wound recheck.  Return to the emergency department if symptoms worsen or new onset of fever, abdominal pain, vomiting, difficulty having a bowel movement, blood in stool.

## 2016-06-14 NOTE — ED Triage Notes (Signed)
Pt reports abscess on mid-upper buttocks. Reports he noticed it Thursday night. Denies known fever but endorses chills and sweats. Denies drainage, v/d. Reports intermittent nausea.

## 2016-06-15 ENCOUNTER — Emergency Department (HOSPITAL_BASED_OUTPATIENT_CLINIC_OR_DEPARTMENT_OTHER)
Admission: EM | Admit: 2016-06-15 | Discharge: 2016-06-15 | Disposition: A | Discharge status: Home / Self Care | Payer: 59 | Attending: Emergency Medicine | Admitting: Emergency Medicine

## 2016-06-15 ENCOUNTER — Encounter (HOSPITAL_BASED_OUTPATIENT_CLINIC_OR_DEPARTMENT_OTHER): Payer: Self-pay

## 2016-06-15 DIAGNOSIS — L0231 Cutaneous abscess of buttock: Secondary | ICD-10-CM

## 2016-06-15 DIAGNOSIS — F17210 Nicotine dependence, cigarettes, uncomplicated: Secondary | ICD-10-CM | POA: Insufficient documentation

## 2016-06-15 MED ORDER — LIDOCAINE-EPINEPHRINE (PF) 2 %-1:200000 IJ SOLN
20.0000 mL | Freq: Once | INTRAMUSCULAR | Status: AC
  Administered 2016-06-15: 20 mL
  Filled 2016-06-15: qty 20

## 2016-06-15 NOTE — ED Triage Notes (Signed)
States was seen yesterday for "butt" abscess-states area is worse

## 2016-06-15 NOTE — ED Notes (Signed)
Pt verbalizes understanding of d/c instructions and denies any further needs at this time. 

## 2016-06-15 NOTE — ED Provider Notes (Signed)
[facility]   CSN: [id number] Arrival date & time: 06/15/16  2058  By signing my name below, I, Nelwyn SalisburyJoshua Fowler, attest that this documentation has been prepared under the direction and in the presence of Azalia BilisKevin Nataliah Hatlestad, MD . Electronically Signed: Nelwyn SalisburyJoshua Fowler, Scribe. 06/15/2016. 9:50 PM.  History   Chief Complaint Chief Complaint  Patient presents with  . Follow-up   The history is provided by the patient. No language interpreter was used.     HPI Comments:  Ryan Spencer is a 30 y.o. male with PMHx of abscesses who presents to the Emergency Department complaining of constant worsening abscess on his right buttock. He reports associated pain, purulence, and swelling to the area. No modifying factors indicated. Pt reports he was here yesterday for the same symptoms where he was given doxycycline but no I&D was performed    Past Medical History:  Diagnosis Date  . Abscess   . Anxiety    valium in use for dentist procedures   . Arthritis   . Headache    not migraines but get bad headaches sometimes   . Inguinal hernia, bilateral   . MRSA (methicillin resistant staph aureus) culture positive     Patient Active Problem List   Diagnosis Date Noted  . ADD (attention deficit disorder) 08/14/2015  . History of inguinal hernia repair 05/16/2015  . TOBACCO ABUSE 11/19/2008  . Arthralgia 08/07/2008    Past Surgical History:  Procedure Laterality Date  . INGUINAL HERNIA REPAIR Bilateral 11/22/2014   Procedure: BILATERAL OPEN INGUINAL HERNIA REPAIR WITH MESH ;  Surgeon: Harriette Bouillonhomas Cornett, MD;  Location: [facility] OR;  Service: General;  Laterality: Bilateral;  . INSERTION OF MESH Bilateral 11/22/2014   Procedure: INSERTION OF MESH;  Surgeon: Harriette Bouillonhomas Cornett, MD;  Location: [facility] OR;  Service: General;  Laterality: Bilateral;  . NO PAST SURGERIES       Home Medications    Prior to Admission medications   Medication Sig Start Date End Date Taking? Authorizing  Provider  cetirizine (ZYRTEC) 10 MG tablet Take 1 tablet (10 mg total) by mouth daily. 02/03/16   Wallis BambergMario Mani, PA-C  doxycycline (VIBRAMYCIN) 100 MG capsule Take 1 capsule (100 mg total) by mouth 2 (two) times daily. 06/14/16   Barrett HenleNicole Elizabeth Nadeau, PA-C  HYDROcodone-acetaminophen (NORCO/VICODIN) 5-325 MG tablet Take 1 tablet by mouth every 4 (four) hours as needed. 06/14/16   Barrett HenleNicole Elizabeth Nadeau, PA-C    Family History Family History  Problem Relation Age of Onset  . Arthritis Mother   . Cancer Maternal Grandmother     lung and lymphoma  . Cancer Paternal Grandmother   . Diabetes Paternal Grandmother   . Cancer Paternal Grandfather     lymphoma    Social History Social History  Substance Use Topics  . Smoking status: Current Every Day Smoker    Packs/day: 0.50    Years: 14.00    Types: Cigarettes    Last attempt to quit: 05/13/2015  . Smokeless tobacco: Never Used  . Alcohol use 0.0 oz/week     Comment: several times per month     Allergies   Tramadol   Review of Systems Review of Systems 10 Systems reviewed and are negative for acute change except as noted in the HPI.  Physical Exam Updated Vital Signs BP 128/67 (BP Location: Left Arm)   Pulse 85   Temp 98.8 F (37.1 C) (Oral)   Resp 18   Ht 5\' 10"  (1.778 m)  Wt 155 lb (70.3 kg)   SpO2 100%   BMI 22.24 kg/m   Physical Exam  Constitutional: He is oriented to person, place, and time. He appears well-developed and well-nourished.  HENT:  Head: Normocephalic.  Eyes: EOM are normal.  Neck: Normal range of motion.  Pulmonary/Chest: Effort normal.  Abdominal: He exhibits no distension.  Musculoskeletal: Normal range of motion.  Neurological: He is alert and oriented to person, place, and time.  Skin:  Fluctuant abscess on right superior lateral buttock.   Psychiatric: He has a normal mood and affect.  Nursing note and vitals reviewed.    ED Treatments / Results  DIAGNOSTIC STUDIES:  Oxygen  Saturation is 100% on RA, normal by my interpretation.    COORDINATION OF CARE:  10:21 PM Discussed treatment plan with pt at bedside which included I&D and pt agreed to plan.  Labs (all labs ordered are listed, but only abnormal results are displayed) Labs Reviewed - No data to display  EKG  EKG Interpretation None       Radiology No results found.  Procedures .Marland Kitchen.Incision and Drainage Performed by: Azalia BilisAMPOS, Ko Bardon Authorized by: Azalia BilisAMPOS, Nyella Eckels   Consent:    Consent obtained:  Verbal   Consent given by:  Patient Location:    Type:  Abscess   Location:  Lower extremity   Lower extremity location:  Buttock   Buttock location:  R buttock Pre-procedure details:    Skin preparation:  Chloraprep and Betadine Anesthesia (see MAR for exact dosages):    Anesthesia method:  Local infiltration   Local anesthetic:  Lidocaine 2% WITH epi Procedure type:    Complexity:  Complex Procedure details:    Incision types:  Single straight   Scalpel blade:  11   Wound management:  Irrigated with saline   Drainage:  Purulent   Drainage amount:  Moderate   Wound treatment:  Wound left open   Packing materials:  None Post-procedure details:    Patient tolerance of procedure:  Tolerated well, no immediate complications    (including critical care time)  Medications Ordered in ED Medications - No data to display   Initial Impression / Assessment and Plan / ED Course  I have reviewed the triage vital signs and the nursing notes.  Pertinent labs & imaging results that were available during my care of the patient were reviewed by me and considered in my medical decision making (see chart for details).  Clinical Course    Incision and drainage of right back abscess.  Purulent material encountered.  Patient will continue on doxycycline.  He should do much better this time.  He understands to return to the ER for new or worsening symptoms   Final Clinical Impressions(s) / ED Diagnoses    Final diagnoses:  None    New Prescriptions New Prescriptions   No medications on file   I personally performed the services described in this documentation, which was scribed in my presence. The recorded information has been reviewed and is accurate.        Azalia BilisKevin Kamyrah Feeser, MD 06/15/16 2325

## 2016-06-26 DIAGNOSIS — F1120 Opioid dependence, uncomplicated: Secondary | ICD-10-CM | POA: Diagnosis not present

## 2016-06-26 DIAGNOSIS — F1920 Other psychoactive substance dependence, uncomplicated: Secondary | ICD-10-CM | POA: Diagnosis not present

## 2016-06-26 DIAGNOSIS — M1990 Unspecified osteoarthritis, unspecified site: Secondary | ICD-10-CM | POA: Diagnosis not present

## 2016-06-26 DIAGNOSIS — Z6820 Body mass index (BMI) 20.0-20.9, adult: Secondary | ICD-10-CM | POA: Diagnosis not present

## 2016-06-26 DIAGNOSIS — F19980 Other psychoactive substance use, unspecified with psychoactive substance-induced anxiety disorder: Secondary | ICD-10-CM | POA: Diagnosis not present

## 2016-06-26 DIAGNOSIS — F988 Other specified behavioral and emotional disorders with onset usually occurring in childhood and adolescence: Secondary | ICD-10-CM | POA: Diagnosis not present

## 2016-06-26 MED FILL — SUBOXONE 8 MG-2 MG SL FILM: 8-2 | 28 days supply | Qty: 56 | Fill #0

## 2016-07-14 MED FILL — DEXTROAMP-AMPHETAMIN 10 MG: 10 | 30 days supply | Qty: 60 | Fill #0

## 2016-07-24 DIAGNOSIS — F1120 Opioid dependence, uncomplicated: Secondary | ICD-10-CM | POA: Diagnosis not present

## 2016-07-24 DIAGNOSIS — F19980 Other psychoactive substance use, unspecified with psychoactive substance-induced anxiety disorder: Secondary | ICD-10-CM | POA: Diagnosis not present

## 2016-07-24 DIAGNOSIS — Z6820 Body mass index (BMI) 20.0-20.9, adult: Secondary | ICD-10-CM | POA: Diagnosis not present

## 2016-07-24 DIAGNOSIS — F988 Other specified behavioral and emotional disorders with onset usually occurring in childhood and adolescence: Secondary | ICD-10-CM | POA: Diagnosis not present

## 2016-07-24 DIAGNOSIS — M1990 Unspecified osteoarthritis, unspecified site: Secondary | ICD-10-CM | POA: Diagnosis not present

## 2016-07-24 DIAGNOSIS — F1920 Other psychoactive substance dependence, uncomplicated: Secondary | ICD-10-CM | POA: Diagnosis not present

## 2016-08-21 DIAGNOSIS — F1120 Opioid dependence, uncomplicated: Secondary | ICD-10-CM | POA: Diagnosis not present

## 2016-08-21 DIAGNOSIS — M1990 Unspecified osteoarthritis, unspecified site: Secondary | ICD-10-CM | POA: Diagnosis not present

## 2016-08-21 DIAGNOSIS — F19980 Other psychoactive substance use, unspecified with psychoactive substance-induced anxiety disorder: Secondary | ICD-10-CM | POA: Diagnosis not present

## 2016-08-21 DIAGNOSIS — Z6820 Body mass index (BMI) 20.0-20.9, adult: Secondary | ICD-10-CM | POA: Diagnosis not present

## 2016-08-21 DIAGNOSIS — F988 Other specified behavioral and emotional disorders with onset usually occurring in childhood and adolescence: Secondary | ICD-10-CM | POA: Diagnosis not present

## 2016-08-21 DIAGNOSIS — F1920 Other psychoactive substance dependence, uncomplicated: Secondary | ICD-10-CM | POA: Diagnosis not present

## 2016-08-28 DIAGNOSIS — Z6820 Body mass index (BMI) 20.0-20.9, adult: Secondary | ICD-10-CM | POA: Diagnosis not present

## 2016-08-28 DIAGNOSIS — F1120 Opioid dependence, uncomplicated: Secondary | ICD-10-CM | POA: Diagnosis not present

## 2016-08-28 DIAGNOSIS — M1990 Unspecified osteoarthritis, unspecified site: Secondary | ICD-10-CM | POA: Diagnosis not present

## 2016-08-28 DIAGNOSIS — F988 Other specified behavioral and emotional disorders with onset usually occurring in childhood and adolescence: Secondary | ICD-10-CM | POA: Diagnosis not present

## 2016-08-28 DIAGNOSIS — F1920 Other psychoactive substance dependence, uncomplicated: Secondary | ICD-10-CM | POA: Diagnosis not present

## 2016-08-28 DIAGNOSIS — F19980 Other psychoactive substance use, unspecified with psychoactive substance-induced anxiety disorder: Secondary | ICD-10-CM | POA: Diagnosis not present

## 2016-08-28 MED FILL — SUBOXONE 8 MG-2 MG SL FILM: 8-2 | 7 days supply | Qty: 14 | Fill #0

## 2016-08-30 ENCOUNTER — Encounter (HOSPITAL_COMMUNITY): Payer: Self-pay

## 2016-08-30 ENCOUNTER — Ambulatory Visit (HOSPITAL_COMMUNITY)
Admission: EM | Admit: 2016-08-30 | Discharge: 2016-08-30 | Disposition: A | Discharge status: Home / Self Care | Payer: 59 | Attending: Emergency Medicine | Admitting: Emergency Medicine

## 2016-08-30 ENCOUNTER — Emergency Department (HOSPITAL_COMMUNITY)
Admission: EM | Admit: 2016-08-30 | Discharge: 2016-08-31 | Disposition: A | Discharge status: Home / Self Care | Payer: 59 | Attending: Emergency Medicine | Admitting: Emergency Medicine

## 2016-08-30 ENCOUNTER — Encounter (HOSPITAL_COMMUNITY): Payer: Self-pay | Admitting: *Deleted

## 2016-08-30 ENCOUNTER — Emergency Department (HOSPITAL_COMMUNITY): Payer: 59

## 2016-08-30 DIAGNOSIS — M25562 Pain in left knee: Secondary | ICD-10-CM

## 2016-08-30 DIAGNOSIS — F17210 Nicotine dependence, cigarettes, uncomplicated: Secondary | ICD-10-CM | POA: Diagnosis not present

## 2016-08-30 DIAGNOSIS — L03116 Cellulitis of left lower limb: Secondary | ICD-10-CM

## 2016-08-30 LAB — COMPREHENSIVE METABOLIC PANEL
ALBUMIN: 4.6 g/dL (ref 3.5–5.0)
ALT: 14 U/L — ABNORMAL LOW (ref 17–63)
ANION GAP: 8 (ref 5–15)
AST: 21 U/L (ref 15–41)
Alkaline Phosphatase: 63 U/L (ref 38–126)
BILIRUBIN TOTAL: 0.5 mg/dL (ref 0.3–1.2)
BUN: 7 mg/dL (ref 6–20)
CHLORIDE: 100 mmol/L — AB (ref 101–111)
CO2: 29 mmol/L (ref 22–32)
Calcium: 9.8 mg/dL (ref 8.9–10.3)
Creatinine, Ser: 0.92 mg/dL (ref 0.61–1.24)
GFR calc Af Amer: >60 mL/min (ref >60)
GLUCOSE: 105 mg/dL — AB (ref 65–99)
POTASSIUM: 3.8 mmol/L (ref 3.5–5.1)
Sodium: 137 mmol/L (ref 135–145)
TOTAL PROTEIN: 6.5 g/dL (ref 6.5–8.1)

## 2016-08-30 LAB — CBC
HEMATOCRIT: 38.5 % — AB (ref 39.0–52.0)
Hemoglobin: 13 g/dL (ref 13.0–17.0)
MCH: 30 pg (ref 26.0–34.0)
MCHC: 33.8 g/dL (ref 30.0–36.0)
MCV: 88.9 fL (ref 78.0–100.0)
PLATELETS: 186 10*3/uL (ref 150–400)
RBC: 4.33 MIL/uL (ref 4.22–5.81)
RDW: 12.6 % (ref 11.5–15.5)
WBC: 9.4 10*3/uL (ref 4.0–10.5)

## 2016-08-30 LAB — SEDIMENTATION RATE: SED RATE: 6 mm/h (ref 0–16)

## 2016-08-30 IMAGING — DX DG KNEE COMPLETE 4+V*L*
4 series · 4 of 4 positions shown · non-contrast
Comparison: None.

CLINICAL DATA: Left knee pain.

EXAM:
LEFT KNEE - COMPLETE 4+ VIEW

[knee ap]
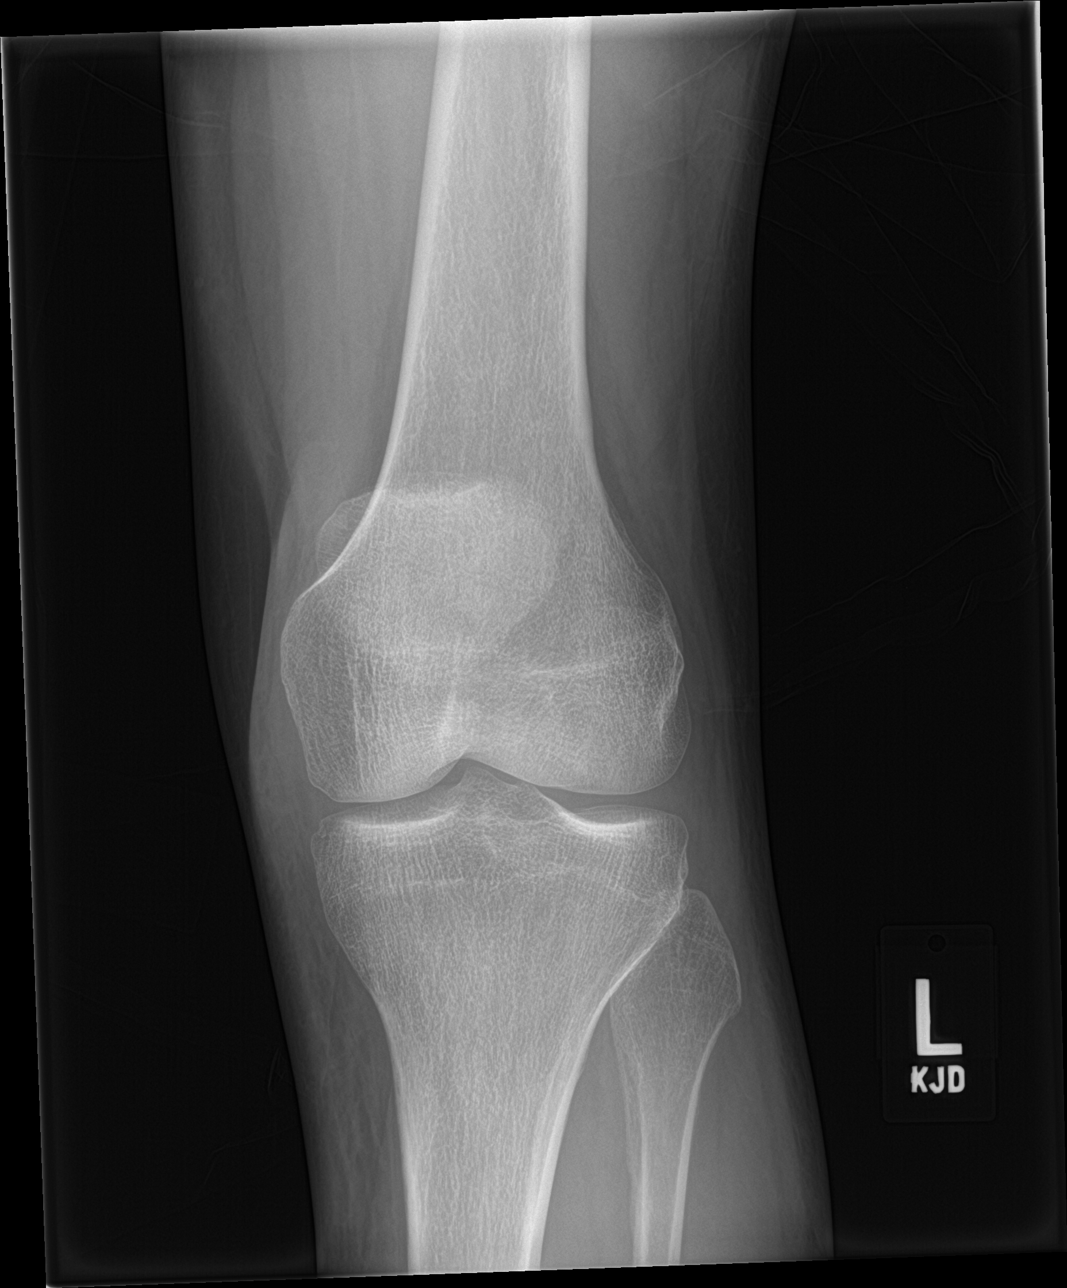

[knee lat]
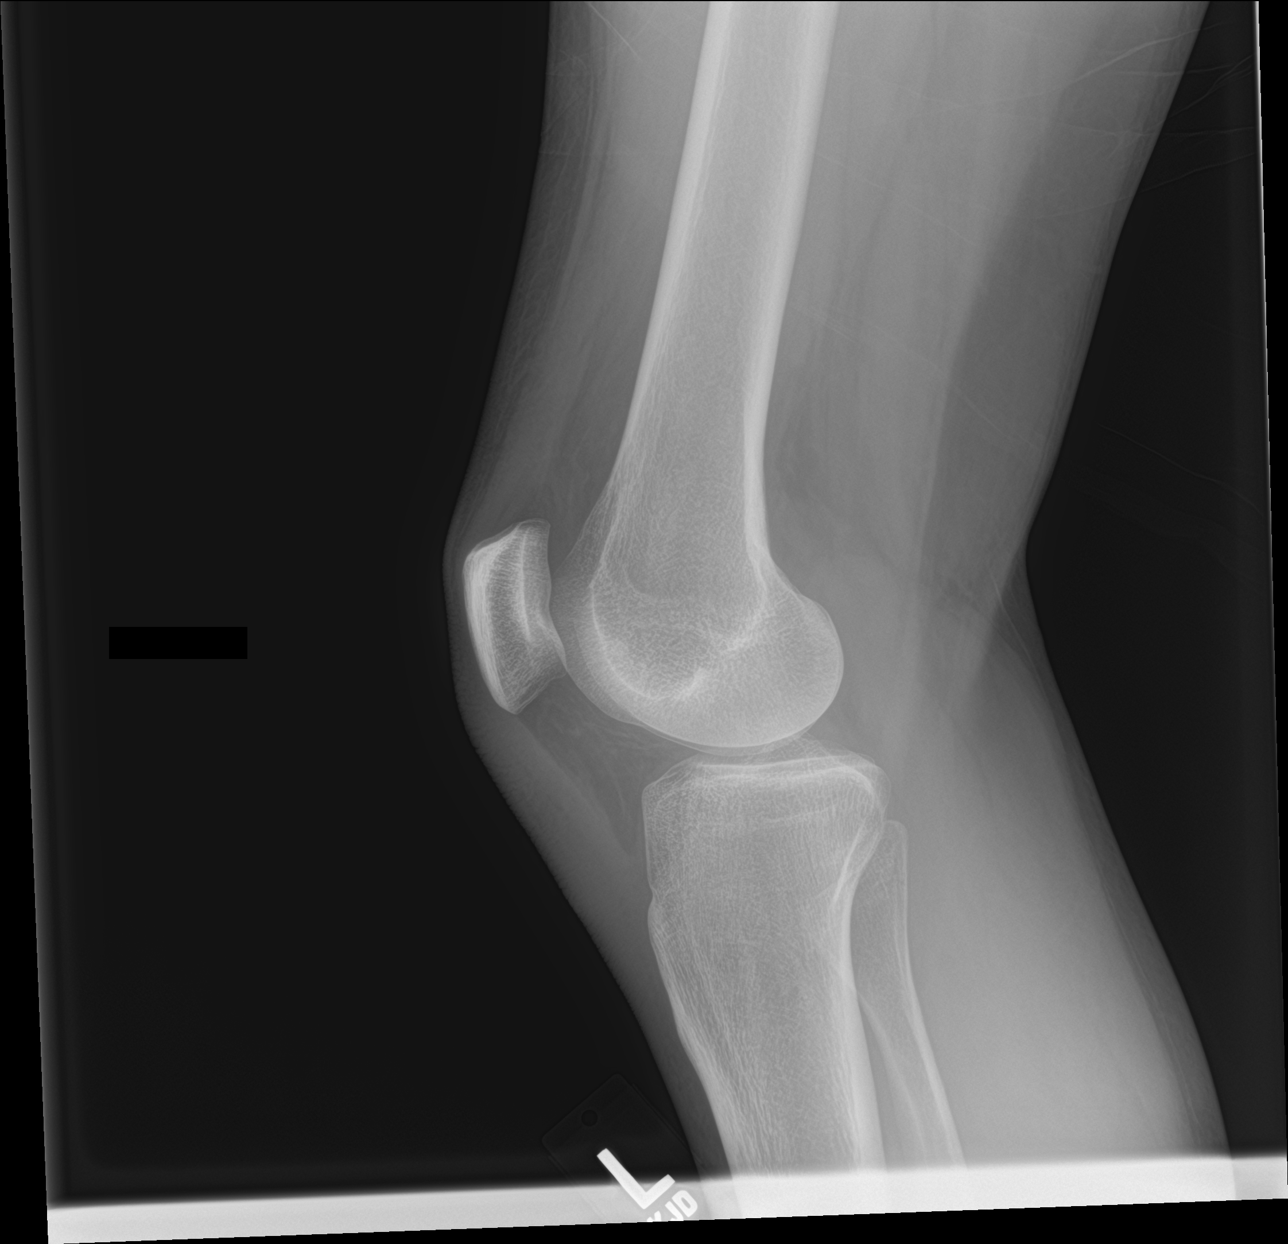

[knee obl (1 of 2)]
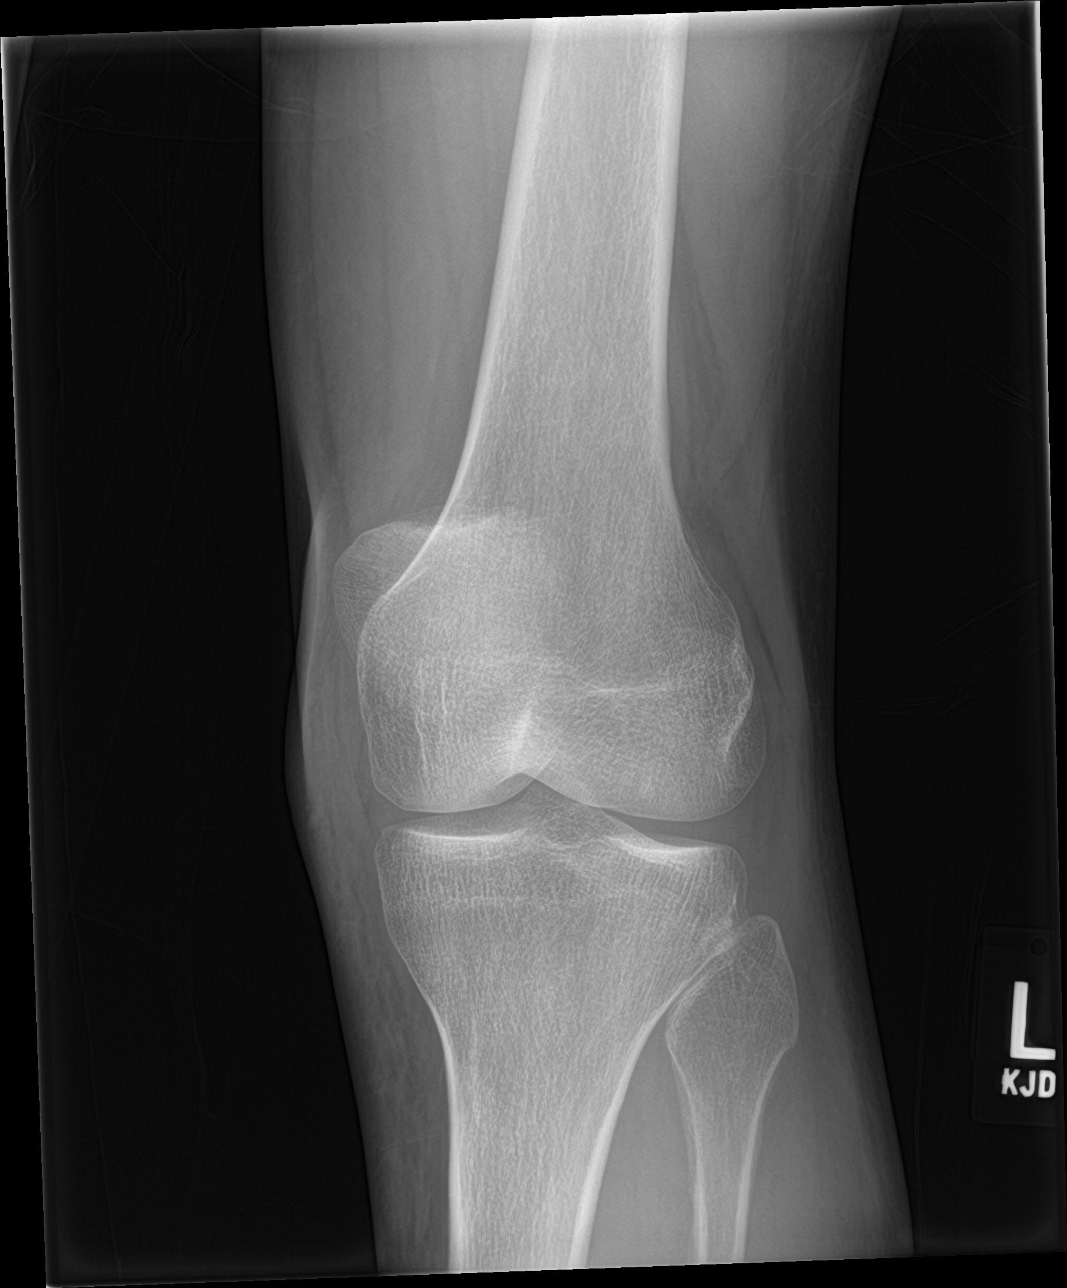

[knee obl (2 of 2)]
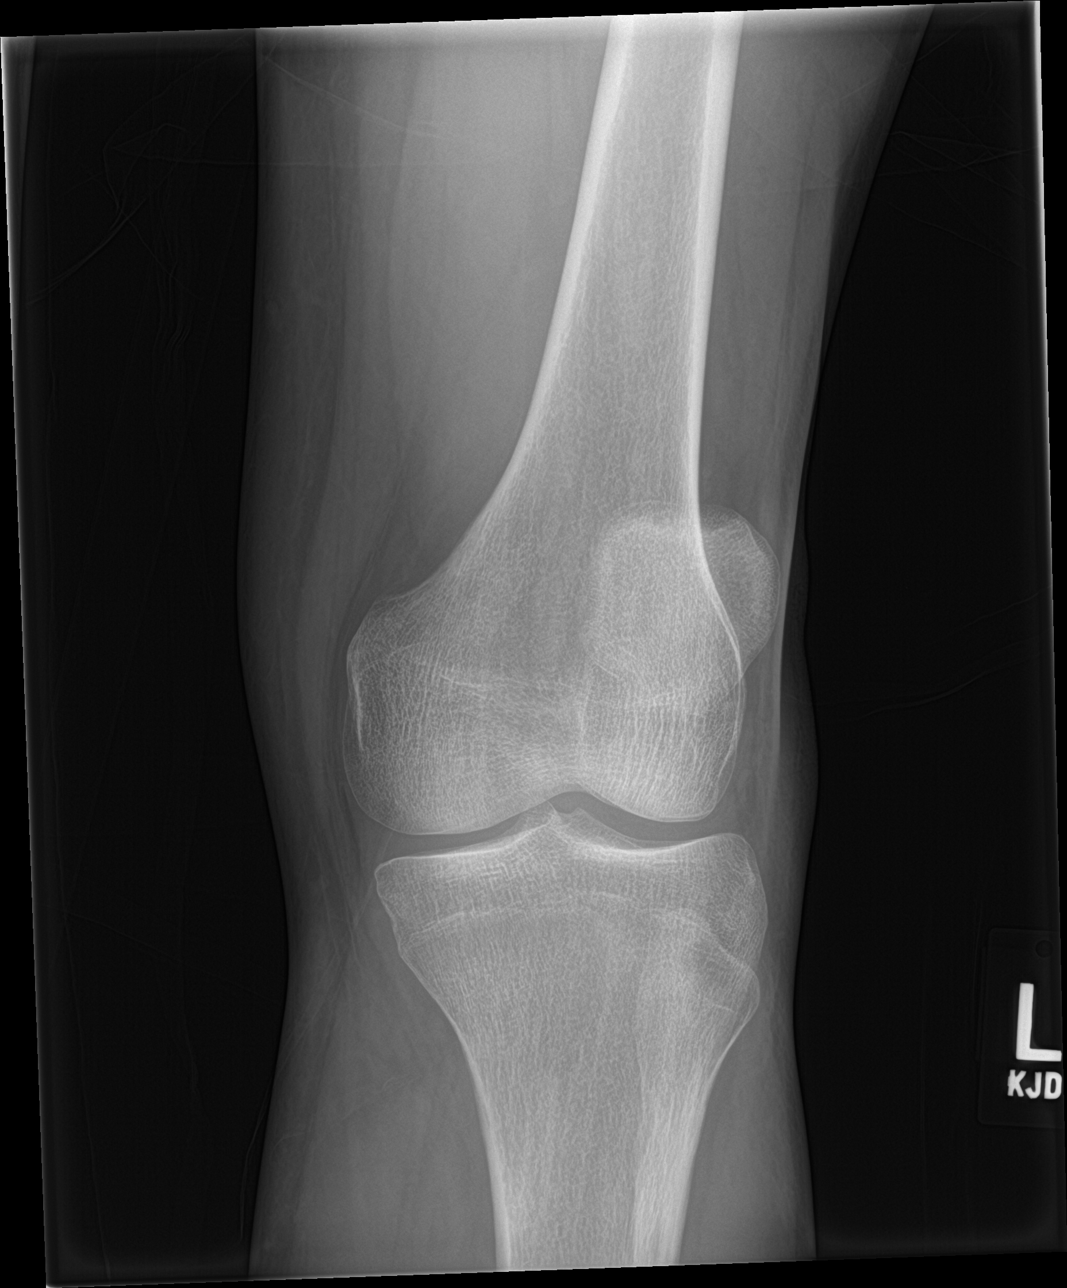

[4 of 4 positions shown; findings below may reference images not displayed]

FINDINGS: No evidence of fracture, dislocation, or joint effusion. No evidence
of arthropathy or other focal bone abnormality. Soft tissues are
unremarkable.
IMPRESSION: Negative.

## 2016-08-30 MED ORDER — DOXYCYCLINE HYCLATE 100 MG PO TABS
100.0000 mg | ORAL_TABLET | Freq: Once | ORAL | Status: AC
  Administered 2016-08-31: 100 mg via ORAL
  Filled 2016-08-30: qty 1

## 2016-08-30 MED ORDER — IBUPROFEN 800 MG PO TABS
800.0000 mg | ORAL_TABLET | Freq: Once | ORAL | Status: AC
  Administered 2016-08-31: 800 mg via ORAL
  Filled 2016-08-30: qty 1

## 2016-08-30 MED ORDER — CEPHALEXIN 250 MG PO CAPS
500.0000 mg | ORAL_CAPSULE | Freq: Once | ORAL | Status: AC
  Administered 2016-08-31: 500 mg via ORAL
  Filled 2016-08-30: qty 2

## 2016-08-30 NOTE — ED Provider Notes (Signed)
CSN: [id number]     Arrival date & time 08/30/16  1927 History   First MD Initiated Contact with Patient 08/30/16 2052     Chief Complaint  Patient presents with  . Knee Pain   (Consider location/radiation/quality/duration/timing/severity/associated sxs/prior Treatment) 30 year old male states that he developed redness and pain to the left knee 2 days ago. Over the past 2 days he has had rather rapid increase in pain, redness, decrease in function of knee and tenderness. No known injury.      Past Medical History:  Diagnosis Date  . Abscess   . Anxiety    valium in use for dentist procedures   . Arthritis   . Headache    not migraines but get bad headaches sometimes   . Inguinal hernia, bilateral   . MRSA (methicillin resistant staph aureus) culture positive    Past Surgical History:  Procedure Laterality Date  . INGUINAL HERNIA REPAIR Bilateral 11/22/2014   Procedure: BILATERAL OPEN INGUINAL HERNIA REPAIR WITH MESH ;  Surgeon: Harriette Bouillonhomas Cornett, MD;  Location: [facility] OR;  Service: General;  Laterality: Bilateral;  . INSERTION OF MESH Bilateral 11/22/2014   Procedure: INSERTION OF MESH;  Surgeon: Harriette Bouillonhomas Cornett, MD;  [facility];  Service: General;  Laterality: Bilateral;  . NO PAST SURGERIES     Family History  Problem Relation Age of Onset  . Arthritis Mother   . Cancer Maternal Grandmother     lung and lymphoma  . Cancer Paternal Grandmother   . Diabetes Paternal Grandmother   . Cancer Paternal Grandfather     lymphoma   Social History  Substance Use Topics  . Smoking status: Current Every Day Smoker    Packs/day: 0.50    Years: 14.00    Types: Cigarettes    Last attempt to quit: 05/13/2015  . Smokeless tobacco: Never Used  . Alcohol use 0.0 oz/week     Comment: several times per month    Review of Systems  Constitutional: Positive for activity change.  HENT: Negative.   Respiratory: Negative.   Musculoskeletal: Positive for arthralgias.  Skin: Positive for  color change.  All other systems reviewed and are negative.   Allergies  Tramadol  Home Medications   Prior to Admission medications   Medication Sig Start Date End Date Taking? Authorizing Provider  cetirizine (ZYRTEC) 10 MG tablet Take 1 tablet (10 mg total) by mouth daily. 02/03/16  Yes Wallis BambergMario Mani, PA-C  doxycycline (VIBRAMYCIN) 100 MG capsule Take 1 capsule (100 mg total) by mouth 2 (two) times daily. 06/14/16   Barrett HenleNicole Elizabeth Nadeau, PA-C  HYDROcodone-acetaminophen (NORCO/VICODIN) 5-325 MG tablet Take 1 tablet by mouth every 4 (four) hours as needed. 06/14/16   Barrett HenleNicole Elizabeth Nadeau, PA-C   Meds Ordered and Administered this Visit  Medications - No data to display  BP 118/69 (BP Location: Left Arm)   Pulse 76   Temp 99.2 F (37.3 C) (Oral)   Resp 16   Ht 5\' 10"  (1.778 m)   Wt 150 lb (68 kg)   SpO2 100%   BMI 21.52 kg/m  No data found.   Physical Exam  Constitutional: He appears well-developed and well-nourished.  Neck: Neck supple.  Cardiovascular: Normal rate.   Pulmonary/Chest: Effort normal.  Musculoskeletal:  Left knee with erythema, increase in warmth, marked tenderness, pain and decreased range of motion.  Nursing note and vitals reviewed.   Urgent Care Course   Clinical Course     Procedures (including critical care time)  Labs  Review Labs Reviewed - No data to display  Imaging Review No results found.   Visual Acuity Review  Right Eye Distance:   Left Eye Distance:   Bilateral Distance:    Right Eye Near:   Left Eye Near:    Bilateral Near:         MDM   1. Acute pain of left knee   2. Cellulitis of left knee    To go to the ED now for treatment of infected knee Cellulitis of the left knee possibly involving the joint. History of MRSA infections. May need IV antibiotics based on evaluation in ED.    Hayden Rasmussenavid Zackeriah Kissler, NP 08/30/16 2104

## 2016-08-30 NOTE — ED Notes (Addendum)
Patient transported to X-Ray 

## 2016-08-30 NOTE — ED Provider Notes (Signed)
By signing my name below, I, Levon HedgerElizabeth Hall, attest that this documentation has been prepared under the direction and in the presence of Kristen N Ward, DO . Electronically Signed: Levon HedgerElizabeth Hall, Scribe. 08/30/2016. 11:58 PM.   TIME SEEN: 11:52 PM   CHIEF COMPLAINT:  Chief Complaint  Patient presents with  . Knee Pain     HPI: Ryan Spencer is a 30 y.o. male with hx of arthritis who presents to the Emergency Department complaining of moderate left knee pain onset two days ago. He notes associated redness and swelling to the Medial area. Pain is exacerbated by bending but he is able to walk, flex and extend his knee. No alleviating factors noted. He states he has had this pain before and was prescribed doxycycline with relief. 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  He denies any fever. No vomiting or diarrhea. He is not diabetic, and HIV. Pt is otherwise in his normal health and has no other complaints at this time. Sent from urgent care for further evaluation.  ROS: See HPI Constitutional: no fever  Eyes: no drainage  ENT: no runny nose   Cardiovascular:  no chest pain  Resp: no SOB  GI: no vomiting GU: no dysuria Integumentary: no rash  Allergy: no hives  Musculoskeletal: no leg swelling  Neurological: no slurred speech ROS otherwise negative  PAST MEDICAL HISTORY/PAST SURGICAL HISTORY:  Past Medical History:  Diagnosis Date  . Abscess   . Anxiety    valium in use for dentist procedures   . Arthritis   . Headache    not migraines but get bad headaches sometimes   . Inguinal hernia, bilateral   . MRSA (methicillin resistant staph aureus) culture positive     MEDICATIONS:  Prior to Admission medications   Medication Sig Start Date End Date Taking? Authorizing Provider  cetirizine (ZYRTEC) 10 MG tablet Take 1 tablet (10 mg total) by mouth daily. 02/03/16   Wallis BambergMario Mani, PA-C  doxycycline (VIBRAMYCIN) 100 MG capsule Take 1  capsule (100 mg total) by mouth 2 (two) times daily. 06/14/16   Barrett HenleNicole Elizabeth Nadeau, PA-C  HYDROcodone-acetaminophen (NORCO/VICODIN) 5-325 MG tablet Take 1 tablet by mouth every 4 (four) hours as needed. 06/14/16   Barrett HenleNicole Elizabeth Nadeau, PA-C    ALLERGIES:  Allergies  Allergen Reactions  . Tramadol Other (See Comments)    Headache    SOCIAL HISTORY:  Social History  Substance Use Topics  . Smoking status: Current Every Day Smoker    Packs/day: 0.50    Years: 14.00    Types: Cigarettes    Last attempt to quit: 05/13/2015  . Smokeless tobacco: Never Used  . Alcohol use 0.0 oz/week     Comment: several times per month    FAMILY HISTORY: Family History  Problem Relation Age of Onset  . Arthritis Mother   . Cancer Maternal Grandmother     lung and lymphoma  . Cancer Paternal Grandmother   . Diabetes Paternal Grandmother   . Cancer Paternal Grandfather     lymphoma    EXAM: BP 112/81 (BP Location: Right Arm)   Pulse 78   Temp 99.1 F (37.3 C) (Oral)   Resp 18   Ht 5\' 10"  (1.778 m)   Wt 146 lb 5 oz (66.4 kg)   SpO2 99%   BMI 20.99 kg/m  CONSTITUTIONAL: Alert and oriented and responds appropriately to questions. Well-appearing; well-nourished HEAD: Normocephalic EYES: Conjunctivae  clear, PERRL ENT: normal nose; no rhinorrhea; moist mucous membranes NECK: Supple, no meningismus, no LAD  CARD: RRR; S1 and S2 appreciated; no murmurs, no clicks, no rubs, no gallops RESP: Normal chest excursion without splinting or tachypnea; breath sounds clear and equal bilaterally; no wheezes, no rhonchi, no rales, no hypoxia or respiratory distress, speaking full sentences ABD/GI: Normal bowel sounds; non-distended; soft, non-tender, no rebound, no guarding, no peritoneal signs BACK:  The back appears normal and is non-tender to palpation, there is no CVA tenderness EXT: Patient is tender to palpation over the medial aspect of the left knee with some mild soft tissue swelling,  erythema and warmth. There is no fluctuance or induration. There is no joint effusion on exam. He has full range of motion in the left knee with minimal pain. 2+ DP pulses bilaterally. He tenderness or bony deformity. Normal ROM in all joints; otherwise extremities are non-tender to palpation; no edema; normal capillary refill; no cyanosis, no calf tenderness or swelling    SKIN: Normal color for age and race; warm; no rash NEURO: Moves all extremities equally, sensation to light touch intact diffusely, cranial nerves II through XII intact PSYCH: The patient's mood and manner are appropriate. Grooming and personal hygiene are appropriate.  MEDICAL DECISION MAKING: Patient here with left leg cellulitis. On my examination he has no joint effusion, full range of motion in this knee and is neurovascularly intact distally. Labs are unremarkable including normal white blood cell count, normal sedimentation rate. Very low suspicion today that this is septic arthritis. He has no joint effusion and I do not feel he needs an arthrocentesis at this time. He is very well-appearing without systemic symptoms. We'll give him ibuprofen. He would like to drive himself home. We'll discharge with prescriptions for doxycycline, Keflex, Vicodin, ibuprofen. Discussed with him at length return precautions. He does have a PCP for follow-up and recommend close follow-up with beginning of next week. He is comfortable with this plan.     At this time, I do not feel there is any life-threatening condition present. I have reviewed and discussed all results (EKG, imaging, lab, urine as appropriate), exam findings with patient/family. I have reviewed nursing notes and appropriate previous records.  I feel the patient is safe to be discharged home without further emergent workup and can continue workup as an outpatient as needed. Discussed usual and customary return precautions. Patient/family verbalize understanding and are comfortable  with this plan.  Outpatient follow-up has been provided. All questions have been answered.   I personally performed the services described in this documentation, which was scribed in my presence. The recorded information has been reviewed and is accurate.    Layla MawKristen N Ward, DO 08/31/16 [phone]

## 2016-08-30 NOTE — ED Triage Notes (Signed)
Pain in his left knee since Friday and said he does has a hx of MRSA. York SpanielSaid he knows it is a abscess and it is red and swollen. No fever but chills. Hurts to touch and walk. Has been taking doxycycline he had left over from his last MRSA infection that he did not finish.

## 2016-08-30 NOTE — ED Triage Notes (Signed)
Pt c/o L knee pain x 2 days. Redness, swelling, and tenderness noted to knee. Denies injury

## 2016-08-30 NOTE — Discharge Instructions (Signed)
To go to the ED now for treatment of infected knee

## 2016-08-31 DIAGNOSIS — F17210 Nicotine dependence, cigarettes, uncomplicated: Secondary | ICD-10-CM | POA: Diagnosis not present

## 2016-08-31 DIAGNOSIS — M25562 Pain in left knee: Secondary | ICD-10-CM | POA: Diagnosis not present

## 2016-08-31 DIAGNOSIS — L03116 Cellulitis of left lower limb: Secondary | ICD-10-CM | POA: Diagnosis not present

## 2016-08-31 LAB — C-REACTIVE PROTEIN: CRP: 3.5 mg/dL — AB (ref <1.0)

## 2016-08-31 MED ORDER — HYDROCODONE-ACETAMINOPHEN 5-325 MG PO TABS: 1.0000 | ORAL_TABLET | Freq: Four times a day (QID) | ORAL | 0 refills | Status: DC | PRN

## 2016-08-31 MED ORDER — CEPHALEXIN 500 MG PO CAPS: 500.0000 mg | ORAL_CAPSULE | Freq: Four times a day (QID) | ORAL | 0 refills | Status: DC

## 2016-08-31 MED ORDER — IBUPROFEN 800 MG PO TABS: 800.0000 mg | ORAL_TABLET | Freq: Three times a day (TID) | ORAL | 0 refills | Status: DC | PRN

## 2016-08-31 MED ORDER — DOXYCYCLINE HYCLATE 100 MG PO CAPS: 100.0000 mg | ORAL_CAPSULE | Freq: Two times a day (BID) | ORAL | 0 refills | Status: DC

## 2016-08-31 MED FILL — DOXYCYCLINE HYCLATE 100 MG: 100 | 7 days supply | Qty: 14 | Fill #0

## 2016-08-31 MED FILL — CEPHALEXIN 500 MG CAPSULE: 500 | 7 days supply | Qty: 28 | Fill #0

## 2016-09-03 DIAGNOSIS — F1120 Opioid dependence, uncomplicated: Secondary | ICD-10-CM | POA: Diagnosis not present

## 2016-09-03 DIAGNOSIS — F1920 Other psychoactive substance dependence, uncomplicated: Secondary | ICD-10-CM | POA: Diagnosis not present

## 2016-09-03 DIAGNOSIS — Z6820 Body mass index (BMI) 20.0-20.9, adult: Secondary | ICD-10-CM | POA: Diagnosis not present

## 2016-09-03 DIAGNOSIS — F19980 Other psychoactive substance use, unspecified with psychoactive substance-induced anxiety disorder: Secondary | ICD-10-CM | POA: Diagnosis not present

## 2016-09-03 DIAGNOSIS — F988 Other specified behavioral and emotional disorders with onset usually occurring in childhood and adolescence: Secondary | ICD-10-CM | POA: Diagnosis not present

## 2016-09-03 DIAGNOSIS — M1990 Unspecified osteoarthritis, unspecified site: Secondary | ICD-10-CM | POA: Diagnosis not present

## 2016-09-03 MED FILL — SUBOXONE 8 MG-2 MG SL FILM: 8-2 | 7 days supply | Qty: 14 | Fill #0

## 2016-09-09 DIAGNOSIS — M1990 Unspecified osteoarthritis, unspecified site: Secondary | ICD-10-CM | POA: Diagnosis not present

## 2016-09-09 DIAGNOSIS — F1120 Opioid dependence, uncomplicated: Secondary | ICD-10-CM | POA: Diagnosis not present

## 2016-09-09 DIAGNOSIS — F988 Other specified behavioral and emotional disorders with onset usually occurring in childhood and adolescence: Secondary | ICD-10-CM | POA: Diagnosis not present

## 2016-09-09 DIAGNOSIS — F1920 Other psychoactive substance dependence, uncomplicated: Secondary | ICD-10-CM | POA: Diagnosis not present

## 2016-09-09 DIAGNOSIS — F19980 Other psychoactive substance use, unspecified with psychoactive substance-induced anxiety disorder: Secondary | ICD-10-CM | POA: Diagnosis not present

## 2016-09-09 DIAGNOSIS — Z6820 Body mass index (BMI) 20.0-20.9, adult: Secondary | ICD-10-CM | POA: Diagnosis not present

## 2016-09-16 DIAGNOSIS — F1120 Opioid dependence, uncomplicated: Secondary | ICD-10-CM | POA: Diagnosis not present

## 2016-09-16 DIAGNOSIS — M1990 Unspecified osteoarthritis, unspecified site: Secondary | ICD-10-CM | POA: Diagnosis not present

## 2016-09-16 DIAGNOSIS — Z6820 Body mass index (BMI) 20.0-20.9, adult: Secondary | ICD-10-CM | POA: Diagnosis not present

## 2016-09-16 DIAGNOSIS — F988 Other specified behavioral and emotional disorders with onset usually occurring in childhood and adolescence: Secondary | ICD-10-CM | POA: Diagnosis not present

## 2016-09-16 DIAGNOSIS — F1920 Other psychoactive substance dependence, uncomplicated: Secondary | ICD-10-CM | POA: Diagnosis not present

## 2016-09-16 DIAGNOSIS — F19980 Other psychoactive substance use, unspecified with psychoactive substance-induced anxiety disorder: Secondary | ICD-10-CM | POA: Diagnosis not present

## 2016-09-16 MED FILL — SUBOXONE 8 MG-2 MG SL FILM: 8-2 | 7 days supply | Qty: 14 | Fill #0

## 2016-09-23 DIAGNOSIS — Z6820 Body mass index (BMI) 20.0-20.9, adult: Secondary | ICD-10-CM | POA: Diagnosis not present

## 2016-09-23 DIAGNOSIS — M1990 Unspecified osteoarthritis, unspecified site: Secondary | ICD-10-CM | POA: Diagnosis not present

## 2016-09-23 DIAGNOSIS — F1920 Other psychoactive substance dependence, uncomplicated: Secondary | ICD-10-CM | POA: Diagnosis not present

## 2016-09-23 DIAGNOSIS — F1120 Opioid dependence, uncomplicated: Secondary | ICD-10-CM | POA: Diagnosis not present

## 2016-09-23 DIAGNOSIS — F19980 Other psychoactive substance use, unspecified with psychoactive substance-induced anxiety disorder: Secondary | ICD-10-CM | POA: Diagnosis not present

## 2016-09-23 DIAGNOSIS — F988 Other specified behavioral and emotional disorders with onset usually occurring in childhood and adolescence: Secondary | ICD-10-CM | POA: Diagnosis not present

## 2016-09-23 MED FILL — SUBOXONE 8 MG-2 MG SL FILM: 8-2 | 7 days supply | Qty: 14 | Fill #0

## 2016-09-27 ENCOUNTER — Encounter (HOSPITAL_COMMUNITY): Payer: Self-pay | Admitting: Emergency Medicine

## 2016-09-27 ENCOUNTER — Ambulatory Visit (HOSPITAL_COMMUNITY)
Admission: EM | Admit: 2016-09-27 | Discharge: 2016-09-27 | Disposition: A | Discharge status: Home / Self Care | Payer: 59 | Attending: Emergency Medicine | Admitting: Emergency Medicine

## 2016-09-27 DIAGNOSIS — L02214 Cutaneous abscess of groin: Secondary | ICD-10-CM | POA: Diagnosis not present

## 2016-09-27 MED ORDER — DOXYCYCLINE HYCLATE 100 MG PO CAPS: 100.0000 mg | ORAL_CAPSULE | Freq: Two times a day (BID) | ORAL | 0 refills | Status: DC

## 2016-09-27 MED ORDER — HYDROCODONE-ACETAMINOPHEN 5-325 MG PO TABS: 1.0000 | ORAL_TABLET | ORAL | 0 refills | Status: DC | PRN

## 2016-09-27 NOTE — Discharge Instructions (Signed)
I didn't get a whole lot of pus out today. Take doxycycline twice a day for 10 days. Use the hydrocodone as needed for pain. Follow-up if this is not improving over the next 2-3 days.

## 2016-09-27 NOTE — ED Triage Notes (Signed)
Here for abscess/ingrown hair in the groin pubic area onset 3 days   Reports he tried to drain it but no success  Getting bigger and more painful  Denies fevers  A&O x4... NAD

## 2016-09-27 NOTE — ED Provider Notes (Signed)
[facility]    CSN: [id number] Arrival date & time: 09/27/16  1629     History   Chief Complaint Chief Complaint  Patient presents with  . Abscess    HPI Ryan HurtMichael V Spencer is a 30 y.o. male.   HPI  He is a 30 year old man here for evaluation of left groin abscess. It started on Friday and has gradually been getting larger. He is warm compresses this morning and it seemed to come to a head, but he was unable to express any pus. He reports feeling a little off today with hot and cold chills. No nausea or vomiting. He does have a history of MRSA sensitive only to doxycycline.  Past Medical History:  Diagnosis Date  . Abscess   . Anxiety    valium in use for dentist procedures   . Arthritis   . Headache    not migraines but get bad headaches sometimes   . Inguinal hernia, bilateral   . MRSA (methicillin resistant staph aureus) culture positive     Patient Active Problem List   Diagnosis Date Noted  . ADD (attention deficit disorder) 08/14/2015  . History of inguinal hernia repair 05/16/2015  . TOBACCO ABUSE 11/19/2008  . Arthralgia 08/07/2008    Past Surgical History:  Procedure Laterality Date  . INGUINAL HERNIA REPAIR Bilateral 11/22/2014   Procedure: BILATERAL OPEN INGUINAL HERNIA REPAIR WITH MESH ;  Surgeon: Harriette Bouillonhomas Cornett, MD;  Location: [facility] OR;  Service: General;  Laterality: Bilateral;  . INSERTION OF MESH Bilateral 11/22/2014   Procedure: INSERTION OF MESH;  Surgeon: Harriette Bouillonhomas Cornett, MD;  Location: [facility] OR;  Service: General;  Laterality: Bilateral;  . NO PAST SURGERIES         Home Medications    Prior to Admission medications   Medication Sig Start Date End Date Taking? Authorizing Provider  doxycycline (VIBRAMYCIN) 100 MG capsule Take 1 capsule (100 mg total) by mouth 2 (two) times daily. 09/27/16   Charm RingsErin J Leigh Blas, MD  HYDROcodone-acetaminophen (NORCO) 5-325 MG tablet Take 1 tablet by mouth every 4 (four) hours as needed for moderate pain. 09/27/16    Charm RingsErin J Loraina Stauffer, MD    Family History Family History  Problem Relation Age of Onset  . Arthritis Mother   . Cancer Maternal Grandmother     lung and lymphoma  . Cancer Paternal Grandmother   . Diabetes Paternal Grandmother   . Cancer Paternal Grandfather     lymphoma    Social History Social History  Substance Use Topics  . Smoking status: Current Every Day Smoker    Packs/day: 0.50    Years: 14.00    Types: Cigarettes    Last attempt to quit: 05/13/2015  . Smokeless tobacco: Never Used  . Alcohol use 0.0 oz/week     Comment: several times per month     Allergies   Tramadol   Review of Systems Review of Systems As in history of present illness  Physical Exam Triage Vital Signs ED Triage Vitals  Enc Vitals Group     BP 09/27/16 1732 124/72     Pulse Rate 09/27/16 1732 67     Resp 09/27/16 1732 18     Temp 09/27/16 1732 99.6 F (37.6 C)     Temp Source 09/27/16 1732 Oral     SpO2 09/27/16 1732 100 %     Weight --      Height --      Head Circumference --  Peak Flow --      Pain Score 09/27/16 1734 5     Pain Loc --      Pain Edu? --      Excl. in GC? --    No data found.   Updated Vital Signs BP 124/72 (BP Location: Right Arm)   Pulse 67   Temp 99.6 F (37.6 C) (Oral)   Resp 18   SpO2 100%   Visual Acuity Right Eye Distance:   Left Eye Distance:   Bilateral Distance:    Right Eye Near:   Left Eye Near:    Bilateral Near:     Physical Exam  Constitutional: He is oriented to person, place, and time. He appears well-developed and well-nourished. No distress.  Cardiovascular: Normal rate.   Pulmonary/Chest: Effort normal.  Neurological: He is alert and oriented to person, place, and time.  Skin:  1 x 2 cm abscess in left groin. Small amount of central fluctuance. There is another 2 cm of surrounding erythema.     UC Treatments / Results  Labs (all labs ordered are listed, but only abnormal results are displayed) Labs Reviewed - No  data to display  EKG  EKG Interpretation None       Radiology No results found.  Procedures .Marland Kitchen.Incision and Drainage Date/Time: 09/27/2016 5:59 PM Performed by: Charm RingsHONIG, Terrianna Holsclaw J Authorized by: Charm RingsHONIG, Eros Montour J   Consent:    Consent obtained:  Verbal   Alternatives discussed:  No treatment Location:    Type:  Abscess   Size:  1x2cm   Location:  Lower extremity   Lower extremity location: Left groin. Pre-procedure details:    Procedure prep: Alcohol. Anesthesia (see MAR for exact dosages):    Anesthesia method:  Local infiltration   Local anesthetic:  Lidocaine 2% w/o epi Procedure type:    Complexity:  Simple Procedure details:    Incision types:  Single straight   Incision depth:  Dermal   Scalpel blade:  11   Wound management:  Probed and deloculated   Drainage:  Bloody and purulent   Drainage amount:  Scant   Wound treatment:  Wound left open   Packing materials:  None Post-procedure details:    Patient tolerance of procedure:  Tolerated well, no immediate complications   (including critical care time)  Medications Ordered in UC Medications - No data to display   Initial Impression / Assessment and Plan / UC Course  I have reviewed the triage vital signs and the nursing notes.  Pertinent labs & imaging results that were available during my care of the patient were reviewed by me and considered in my medical decision making (see chart for details).  Clinical Course     Minimal drainage from the I&D. Treat with doxycycline for 10 days. Hydrocodoen as needed for pain. Follow-up as needed.  Final Clinical Impressions(s) / UC Diagnoses   Final diagnoses:  Abscess of groin, left    New Prescriptions New Prescriptions   DOXYCYCLINE (VIBRAMYCIN) 100 MG CAPSULE    Take 1 capsule (100 mg total) by mouth 2 (two) times daily.   HYDROCODONE-ACETAMINOPHEN (NORCO) 5-325 MG TABLET    Take 1 tablet by mouth every 4 (four) hours as needed for moderate pain.       Charm RingsErin J Jeselle Hiser, MD 09/27/16 1800

## 2016-09-27 NOTE — ED Notes (Signed)
Applied 4x4 gauze and secured w/hypofix 

## 2016-09-28 MED FILL — DOXYCYCLINE HYCLATE 100 MG: 100 | 10 days supply | Qty: 20 | Fill #0

## 2016-09-30 DIAGNOSIS — F1120 Opioid dependence, uncomplicated: Secondary | ICD-10-CM | POA: Diagnosis not present

## 2016-09-30 DIAGNOSIS — F988 Other specified behavioral and emotional disorders with onset usually occurring in childhood and adolescence: Secondary | ICD-10-CM | POA: Diagnosis not present

## 2016-09-30 DIAGNOSIS — F1920 Other psychoactive substance dependence, uncomplicated: Secondary | ICD-10-CM | POA: Diagnosis not present

## 2016-09-30 DIAGNOSIS — F19980 Other psychoactive substance use, unspecified with psychoactive substance-induced anxiety disorder: Secondary | ICD-10-CM | POA: Diagnosis not present

## 2016-09-30 DIAGNOSIS — M1990 Unspecified osteoarthritis, unspecified site: Secondary | ICD-10-CM | POA: Diagnosis not present

## 2016-09-30 DIAGNOSIS — Z6820 Body mass index (BMI) 20.0-20.9, adult: Secondary | ICD-10-CM | POA: Diagnosis not present

## 2016-09-30 MED FILL — SUBOXONE 8 MG-2 MG SL FILM: 8-2 | 14 days supply | Qty: 28 | Fill #0

## 2016-10-16 DIAGNOSIS — Z6820 Body mass index (BMI) 20.0-20.9, adult: Secondary | ICD-10-CM | POA: Diagnosis not present

## 2016-10-16 DIAGNOSIS — F1920 Other psychoactive substance dependence, uncomplicated: Secondary | ICD-10-CM | POA: Diagnosis not present

## 2016-10-16 DIAGNOSIS — F1120 Opioid dependence, uncomplicated: Secondary | ICD-10-CM | POA: Diagnosis not present

## 2016-10-16 DIAGNOSIS — F19980 Other psychoactive substance use, unspecified with psychoactive substance-induced anxiety disorder: Secondary | ICD-10-CM | POA: Diagnosis not present

## 2016-10-16 MED FILL — SUBOXONE 8 MG-2 MG SL FILM: 8-2 | 14 days supply | Qty: 28 | Fill #0

## 2016-10-30 DIAGNOSIS — F19980 Other psychoactive substance use, unspecified with psychoactive substance-induced anxiety disorder: Secondary | ICD-10-CM | POA: Diagnosis not present

## 2016-10-30 DIAGNOSIS — F1920 Other psychoactive substance dependence, uncomplicated: Secondary | ICD-10-CM | POA: Diagnosis not present

## 2016-10-30 DIAGNOSIS — M1990 Unspecified osteoarthritis, unspecified site: Secondary | ICD-10-CM | POA: Diagnosis not present

## 2016-10-30 DIAGNOSIS — Z6820 Body mass index (BMI) 20.0-20.9, adult: Secondary | ICD-10-CM | POA: Diagnosis not present

## 2016-10-30 DIAGNOSIS — F988 Other specified behavioral and emotional disorders with onset usually occurring in childhood and adolescence: Secondary | ICD-10-CM | POA: Diagnosis not present

## 2016-10-30 DIAGNOSIS — F1120 Opioid dependence, uncomplicated: Secondary | ICD-10-CM | POA: Diagnosis not present

## 2016-10-30 MED FILL — SUBOXONE 8 MG-2 MG SL FILM: 8-2 | 14 days supply | Qty: 28 | Fill #0

## 2016-11-13 DIAGNOSIS — F988 Other specified behavioral and emotional disorders with onset usually occurring in childhood and adolescence: Secondary | ICD-10-CM | POA: Diagnosis not present

## 2016-11-13 DIAGNOSIS — F1120 Opioid dependence, uncomplicated: Secondary | ICD-10-CM | POA: Diagnosis not present

## 2016-11-13 DIAGNOSIS — F1920 Other psychoactive substance dependence, uncomplicated: Secondary | ICD-10-CM | POA: Diagnosis not present

## 2016-11-13 DIAGNOSIS — F19980 Other psychoactive substance use, unspecified with psychoactive substance-induced anxiety disorder: Secondary | ICD-10-CM | POA: Diagnosis not present

## 2016-11-13 DIAGNOSIS — M1990 Unspecified osteoarthritis, unspecified site: Secondary | ICD-10-CM | POA: Diagnosis not present

## 2016-11-13 DIAGNOSIS — Z6821 Body mass index (BMI) 21.0-21.9, adult: Secondary | ICD-10-CM | POA: Diagnosis not present

## 2016-11-13 MED FILL — SUBOXONE 8 MG-2 MG SL FILM: 8-2 | 14 days supply | Qty: 28 | Fill #0

## 2016-11-27 DIAGNOSIS — Z6821 Body mass index (BMI) 21.0-21.9, adult: Secondary | ICD-10-CM | POA: Diagnosis not present

## 2016-11-27 DIAGNOSIS — F988 Other specified behavioral and emotional disorders with onset usually occurring in childhood and adolescence: Secondary | ICD-10-CM | POA: Diagnosis not present

## 2016-11-27 DIAGNOSIS — F1920 Other psychoactive substance dependence, uncomplicated: Secondary | ICD-10-CM | POA: Diagnosis not present

## 2016-11-27 DIAGNOSIS — F1120 Opioid dependence, uncomplicated: Secondary | ICD-10-CM | POA: Diagnosis not present

## 2016-11-27 DIAGNOSIS — M1990 Unspecified osteoarthritis, unspecified site: Secondary | ICD-10-CM | POA: Diagnosis not present

## 2016-11-27 MED FILL — SUBOXONE 8 MG-2 MG SL FILM: 8-2 | 7 days supply | Qty: 14 | Fill #0

## 2016-12-02 ENCOUNTER — Telehealth: Payer: Self-pay

## 2016-12-02 NOTE — Telephone Encounter (Signed)
Pt would like a call from WhitehallMani. Has some questions about a treatment program that he is in and would like to see if Urban GibsonMani can give him some direction or has any other options for him. Call back is [phone]

## 2016-12-03 ENCOUNTER — Telehealth: Payer: Self-pay

## 2016-12-03 NOTE — Telephone Encounter (Signed)
Pt is returning Mani's call.  Please advise  [phone]

## 2016-12-03 NOTE — Telephone Encounter (Signed)
Patient is in a suboxone program. He was hoping to be managed by our clinic here. I let the patient know that we do not manage this here. I will be in touch with the patient regarding any recommendations for him.

## 2016-12-03 NOTE — Telephone Encounter (Signed)
I discussed case with PA-Jeffery and PA-Weber. They recommended Dr. Royston SinnerKip Corrington with [facility]. I passed along the information to Lubrizol CorporationMike Lucarelli. Follow up as needed.

## 2016-12-03 NOTE — Telephone Encounter (Signed)
Called patient back and requested a call back. I recommended he leave a message for me with a good time to reach him. Please let me know.

## 2016-12-04 DIAGNOSIS — F19980 Other psychoactive substance use, unspecified with psychoactive substance-induced anxiety disorder: Secondary | ICD-10-CM | POA: Diagnosis not present

## 2016-12-04 DIAGNOSIS — F1120 Opioid dependence, uncomplicated: Secondary | ICD-10-CM | POA: Diagnosis not present

## 2016-12-04 DIAGNOSIS — M1990 Unspecified osteoarthritis, unspecified site: Secondary | ICD-10-CM | POA: Diagnosis not present

## 2016-12-04 DIAGNOSIS — F988 Other specified behavioral and emotional disorders with onset usually occurring in childhood and adolescence: Secondary | ICD-10-CM | POA: Diagnosis not present

## 2016-12-04 DIAGNOSIS — Z6820 Body mass index (BMI) 20.0-20.9, adult: Secondary | ICD-10-CM | POA: Diagnosis not present

## 2016-12-04 DIAGNOSIS — F1920 Other psychoactive substance dependence, uncomplicated: Secondary | ICD-10-CM | POA: Diagnosis not present

## 2016-12-04 MED FILL — SUBOXONE 8 MG-2 MG SL FILM: 8-2 | 7 days supply | Qty: 14 | Fill #0

## 2016-12-10 DIAGNOSIS — M1990 Unspecified osteoarthritis, unspecified site: Secondary | ICD-10-CM | POA: Diagnosis not present

## 2016-12-10 DIAGNOSIS — F988 Other specified behavioral and emotional disorders with onset usually occurring in childhood and adolescence: Secondary | ICD-10-CM | POA: Diagnosis not present

## 2016-12-10 DIAGNOSIS — F1120 Opioid dependence, uncomplicated: Secondary | ICD-10-CM | POA: Diagnosis not present

## 2016-12-10 DIAGNOSIS — F19980 Other psychoactive substance use, unspecified with psychoactive substance-induced anxiety disorder: Secondary | ICD-10-CM | POA: Diagnosis not present

## 2016-12-10 DIAGNOSIS — F1920 Other psychoactive substance dependence, uncomplicated: Secondary | ICD-10-CM | POA: Diagnosis not present

## 2016-12-10 DIAGNOSIS — Z6820 Body mass index (BMI) 20.0-20.9, adult: Secondary | ICD-10-CM | POA: Diagnosis not present

## 2016-12-10 MED FILL — SUBOXONE 8 MG-2 MG SL FILM: 8-2 | 7 days supply | Qty: 14 | Fill #0

## 2016-12-17 DIAGNOSIS — M1990 Unspecified osteoarthritis, unspecified site: Secondary | ICD-10-CM | POA: Diagnosis not present

## 2016-12-17 DIAGNOSIS — F1120 Opioid dependence, uncomplicated: Secondary | ICD-10-CM | POA: Diagnosis not present

## 2016-12-17 DIAGNOSIS — F1920 Other psychoactive substance dependence, uncomplicated: Secondary | ICD-10-CM | POA: Diagnosis not present

## 2016-12-17 DIAGNOSIS — Z6821 Body mass index (BMI) 21.0-21.9, adult: Secondary | ICD-10-CM | POA: Diagnosis not present

## 2016-12-17 DIAGNOSIS — F19980 Other psychoactive substance use, unspecified with psychoactive substance-induced anxiety disorder: Secondary | ICD-10-CM | POA: Diagnosis not present

## 2016-12-17 DIAGNOSIS — F988 Other specified behavioral and emotional disorders with onset usually occurring in childhood and adolescence: Secondary | ICD-10-CM | POA: Diagnosis not present

## 2016-12-17 MED FILL — SUBOXONE 8 MG-2 MG SL FILM: 8-2 | 7 days supply | Qty: 14 | Fill #0

## 2016-12-24 DIAGNOSIS — F1920 Other psychoactive substance dependence, uncomplicated: Secondary | ICD-10-CM | POA: Diagnosis not present

## 2016-12-24 DIAGNOSIS — F988 Other specified behavioral and emotional disorders with onset usually occurring in childhood and adolescence: Secondary | ICD-10-CM | POA: Diagnosis not present

## 2016-12-24 DIAGNOSIS — M1990 Unspecified osteoarthritis, unspecified site: Secondary | ICD-10-CM | POA: Diagnosis not present

## 2016-12-24 DIAGNOSIS — F1120 Opioid dependence, uncomplicated: Secondary | ICD-10-CM | POA: Diagnosis not present

## 2016-12-24 DIAGNOSIS — F19980 Other psychoactive substance use, unspecified with psychoactive substance-induced anxiety disorder: Secondary | ICD-10-CM | POA: Diagnosis not present

## 2016-12-24 DIAGNOSIS — Z6821 Body mass index (BMI) 21.0-21.9, adult: Secondary | ICD-10-CM | POA: Diagnosis not present

## 2016-12-30 DIAGNOSIS — F1120 Opioid dependence, uncomplicated: Secondary | ICD-10-CM | POA: Diagnosis not present

## 2016-12-30 DIAGNOSIS — F19980 Other psychoactive substance use, unspecified with psychoactive substance-induced anxiety disorder: Secondary | ICD-10-CM | POA: Diagnosis not present

## 2016-12-30 DIAGNOSIS — F1920 Other psychoactive substance dependence, uncomplicated: Secondary | ICD-10-CM | POA: Diagnosis not present

## 2016-12-30 DIAGNOSIS — F988 Other specified behavioral and emotional disorders with onset usually occurring in childhood and adolescence: Secondary | ICD-10-CM | POA: Diagnosis not present

## 2016-12-30 DIAGNOSIS — Z6821 Body mass index (BMI) 21.0-21.9, adult: Secondary | ICD-10-CM | POA: Diagnosis not present

## 2016-12-30 DIAGNOSIS — M1990 Unspecified osteoarthritis, unspecified site: Secondary | ICD-10-CM | POA: Diagnosis not present

## 2016-12-30 MED FILL — SUBOXONE 8 MG-2 MG SL FILM: 8-2 | 14 days supply | Qty: 28 | Fill #0

## 2017-01-13 DIAGNOSIS — M1990 Unspecified osteoarthritis, unspecified site: Secondary | ICD-10-CM | POA: Diagnosis not present

## 2017-01-13 DIAGNOSIS — F988 Other specified behavioral and emotional disorders with onset usually occurring in childhood and adolescence: Secondary | ICD-10-CM | POA: Diagnosis not present

## 2017-01-13 DIAGNOSIS — Z6821 Body mass index (BMI) 21.0-21.9, adult: Secondary | ICD-10-CM | POA: Diagnosis not present

## 2017-01-13 DIAGNOSIS — F19980 Other psychoactive substance use, unspecified with psychoactive substance-induced anxiety disorder: Secondary | ICD-10-CM | POA: Diagnosis not present

## 2017-01-13 DIAGNOSIS — F1120 Opioid dependence, uncomplicated: Secondary | ICD-10-CM | POA: Diagnosis not present

## 2017-01-13 DIAGNOSIS — F1920 Other psychoactive substance dependence, uncomplicated: Secondary | ICD-10-CM | POA: Diagnosis not present

## 2017-01-13 MED FILL — SUBOXONE 8 MG-2 MG SL FILM: 8-2 | 14 days supply | Qty: 28 | Fill #0

## 2017-01-27 DIAGNOSIS — F988 Other specified behavioral and emotional disorders with onset usually occurring in childhood and adolescence: Secondary | ICD-10-CM | POA: Diagnosis not present

## 2017-01-27 DIAGNOSIS — F19980 Other psychoactive substance use, unspecified with psychoactive substance-induced anxiety disorder: Secondary | ICD-10-CM | POA: Diagnosis not present

## 2017-01-27 DIAGNOSIS — F1920 Other psychoactive substance dependence, uncomplicated: Secondary | ICD-10-CM | POA: Diagnosis not present

## 2017-01-27 DIAGNOSIS — F1120 Opioid dependence, uncomplicated: Secondary | ICD-10-CM | POA: Diagnosis not present

## 2017-01-27 DIAGNOSIS — M1990 Unspecified osteoarthritis, unspecified site: Secondary | ICD-10-CM | POA: Diagnosis not present

## 2017-01-27 DIAGNOSIS — Z6821 Body mass index (BMI) 21.0-21.9, adult: Secondary | ICD-10-CM | POA: Diagnosis not present

## 2017-01-27 MED FILL — SUBOXONE 8 MG-2 MG SL FILM: 8-2 | 14 days supply | Qty: 28 | Fill #0

## 2017-01-29 MED FILL — traMADol HCL 50 MG TABS: 50 | 4 days supply | Qty: 16 | Fill #0

## 2017-02-10 DIAGNOSIS — Z6821 Body mass index (BMI) 21.0-21.9, adult: Secondary | ICD-10-CM | POA: Diagnosis not present

## 2017-02-10 DIAGNOSIS — F19980 Other psychoactive substance use, unspecified with psychoactive substance-induced anxiety disorder: Secondary | ICD-10-CM | POA: Diagnosis not present

## 2017-02-10 DIAGNOSIS — F1920 Other psychoactive substance dependence, uncomplicated: Secondary | ICD-10-CM | POA: Diagnosis not present

## 2017-02-10 DIAGNOSIS — M1990 Unspecified osteoarthritis, unspecified site: Secondary | ICD-10-CM | POA: Diagnosis not present

## 2017-02-10 DIAGNOSIS — F988 Other specified behavioral and emotional disorders with onset usually occurring in childhood and adolescence: Secondary | ICD-10-CM | POA: Diagnosis not present

## 2017-02-10 DIAGNOSIS — F1120 Opioid dependence, uncomplicated: Secondary | ICD-10-CM | POA: Diagnosis not present

## 2017-02-10 MED FILL — SUBOXONE 8 MG-2 MG SL FILM: 8-2 | 14 days supply | Qty: 28 | Fill #0

## 2017-02-24 DIAGNOSIS — F19980 Other psychoactive substance use, unspecified with psychoactive substance-induced anxiety disorder: Secondary | ICD-10-CM | POA: Diagnosis not present

## 2017-02-24 DIAGNOSIS — Z6821 Body mass index (BMI) 21.0-21.9, adult: Secondary | ICD-10-CM | POA: Diagnosis not present

## 2017-02-24 DIAGNOSIS — F988 Other specified behavioral and emotional disorders with onset usually occurring in childhood and adolescence: Secondary | ICD-10-CM | POA: Diagnosis not present

## 2017-02-24 DIAGNOSIS — F1920 Other psychoactive substance dependence, uncomplicated: Secondary | ICD-10-CM | POA: Diagnosis not present

## 2017-02-24 DIAGNOSIS — F1120 Opioid dependence, uncomplicated: Secondary | ICD-10-CM | POA: Diagnosis not present

## 2017-02-24 DIAGNOSIS — M1990 Unspecified osteoarthritis, unspecified site: Secondary | ICD-10-CM | POA: Diagnosis not present

## 2017-02-24 MED FILL — SUBOXONE 8 MG-2 MG SL FILM: 8-2 | 14 days supply | Qty: 28 | Fill #0

## 2017-03-10 DIAGNOSIS — F1120 Opioid dependence, uncomplicated: Secondary | ICD-10-CM | POA: Diagnosis not present

## 2017-03-10 DIAGNOSIS — F988 Other specified behavioral and emotional disorders with onset usually occurring in childhood and adolescence: Secondary | ICD-10-CM | POA: Diagnosis not present

## 2017-03-10 DIAGNOSIS — M1990 Unspecified osteoarthritis, unspecified site: Secondary | ICD-10-CM | POA: Diagnosis not present

## 2017-03-10 DIAGNOSIS — Z6821 Body mass index (BMI) 21.0-21.9, adult: Secondary | ICD-10-CM | POA: Diagnosis not present

## 2017-03-10 DIAGNOSIS — F1920 Other psychoactive substance dependence, uncomplicated: Secondary | ICD-10-CM | POA: Diagnosis not present

## 2017-03-10 DIAGNOSIS — F19980 Other psychoactive substance use, unspecified with psychoactive substance-induced anxiety disorder: Secondary | ICD-10-CM | POA: Diagnosis not present

## 2017-03-10 MED FILL — SUBOXONE 8 MG-2 MG SL FILM: 8-2 | 28 days supply | Qty: 56 | Fill #0

## 2017-04-07 DIAGNOSIS — F1120 Opioid dependence, uncomplicated: Secondary | ICD-10-CM | POA: Diagnosis not present

## 2017-04-07 DIAGNOSIS — Z6821 Body mass index (BMI) 21.0-21.9, adult: Secondary | ICD-10-CM | POA: Diagnosis not present

## 2017-04-07 DIAGNOSIS — F1920 Other psychoactive substance dependence, uncomplicated: Secondary | ICD-10-CM | POA: Diagnosis not present

## 2017-04-07 DIAGNOSIS — F19980 Other psychoactive substance use, unspecified with psychoactive substance-induced anxiety disorder: Secondary | ICD-10-CM | POA: Diagnosis not present

## 2017-04-07 DIAGNOSIS — F988 Other specified behavioral and emotional disorders with onset usually occurring in childhood and adolescence: Secondary | ICD-10-CM | POA: Diagnosis not present

## 2017-04-07 DIAGNOSIS — M1990 Unspecified osteoarthritis, unspecified site: Secondary | ICD-10-CM | POA: Diagnosis not present

## 2017-04-07 MED FILL — SUBOXONE 8 MG-2 MG SL FILM: 8-2 | 28 days supply | Qty: 56 | Fill #0

## 2017-05-05 DIAGNOSIS — F1920 Other psychoactive substance dependence, uncomplicated: Secondary | ICD-10-CM | POA: Diagnosis not present

## 2017-05-05 DIAGNOSIS — F988 Other specified behavioral and emotional disorders with onset usually occurring in childhood and adolescence: Secondary | ICD-10-CM | POA: Diagnosis not present

## 2017-05-05 DIAGNOSIS — F1120 Opioid dependence, uncomplicated: Secondary | ICD-10-CM | POA: Diagnosis not present

## 2017-05-05 DIAGNOSIS — Z6821 Body mass index (BMI) 21.0-21.9, adult: Secondary | ICD-10-CM | POA: Diagnosis not present

## 2017-05-05 DIAGNOSIS — F19980 Other psychoactive substance use, unspecified with psychoactive substance-induced anxiety disorder: Secondary | ICD-10-CM | POA: Diagnosis not present

## 2017-05-05 DIAGNOSIS — M1990 Unspecified osteoarthritis, unspecified site: Secondary | ICD-10-CM | POA: Diagnosis not present

## 2017-05-05 MED FILL — SUBOXONE 8 MG-2 MG SL FILM: 8-2 | 28 days supply | Qty: 56 | Fill #0

## 2017-06-02 DIAGNOSIS — F1120 Opioid dependence, uncomplicated: Secondary | ICD-10-CM | POA: Diagnosis not present

## 2017-06-02 DIAGNOSIS — Z6822 Body mass index (BMI) 22.0-22.9, adult: Secondary | ICD-10-CM | POA: Diagnosis not present

## 2017-06-02 DIAGNOSIS — F19980 Other psychoactive substance use, unspecified with psychoactive substance-induced anxiety disorder: Secondary | ICD-10-CM | POA: Diagnosis not present

## 2017-06-02 DIAGNOSIS — M1990 Unspecified osteoarthritis, unspecified site: Secondary | ICD-10-CM | POA: Diagnosis not present

## 2017-06-02 DIAGNOSIS — F1920 Other psychoactive substance dependence, uncomplicated: Secondary | ICD-10-CM | POA: Diagnosis not present

## 2017-06-02 MED FILL — SUBOXONE 8 MG-2 MG SL FILM: 8-2 | 28 days supply | Qty: 42 | Fill #0

## 2017-06-30 DIAGNOSIS — Z6822 Body mass index (BMI) 22.0-22.9, adult: Secondary | ICD-10-CM | POA: Diagnosis not present

## 2017-06-30 DIAGNOSIS — F19980 Other psychoactive substance use, unspecified with psychoactive substance-induced anxiety disorder: Secondary | ICD-10-CM | POA: Diagnosis not present

## 2017-06-30 DIAGNOSIS — M1990 Unspecified osteoarthritis, unspecified site: Secondary | ICD-10-CM | POA: Diagnosis not present

## 2017-06-30 DIAGNOSIS — F1120 Opioid dependence, uncomplicated: Secondary | ICD-10-CM | POA: Diagnosis not present

## 2017-06-30 DIAGNOSIS — F1920 Other psychoactive substance dependence, uncomplicated: Secondary | ICD-10-CM | POA: Diagnosis not present

## 2017-07-19 ENCOUNTER — Ambulatory Visit (INDEPENDENT_AMBULATORY_CARE_PROVIDER_SITE_OTHER): Payer: 59

## 2017-07-19 ENCOUNTER — Encounter: Payer: Self-pay | Admitting: Family Medicine

## 2017-07-19 ENCOUNTER — Ambulatory Visit (INDEPENDENT_AMBULATORY_CARE_PROVIDER_SITE_OTHER): Payer: 59 | Admitting: Family Medicine

## 2017-07-19 VITALS — BP 104/68 | HR 63 | Temp 98.0°F | Resp 16 | Ht 71.26 in | Wt 161.0 lb

## 2017-07-19 DIAGNOSIS — M25531 Pain in right wrist: Secondary | ICD-10-CM

## 2017-07-19 DIAGNOSIS — S60211A Contusion of right wrist, initial encounter: Secondary | ICD-10-CM

## 2017-07-19 DIAGNOSIS — S60511A Abrasion of right hand, initial encounter: Secondary | ICD-10-CM

## 2017-07-19 DIAGNOSIS — S6991XA Unspecified injury of right wrist, hand and finger(s), initial encounter: Secondary | ICD-10-CM | POA: Diagnosis not present

## 2017-07-19 IMAGING — DX DG WRIST COMPLETE 3+V*R*
4 series · 4 of 4 positions shown · non-contrast
Comparison: Right finger radiograph [DATE].

CLINICAL DATA: 30-year-old male with history of fall on
outstretched hand yesterday complaining of wrist pain.

EXAM:
RIGHT WRIST - COMPLETE 3+ VIEW

[wrist pa]
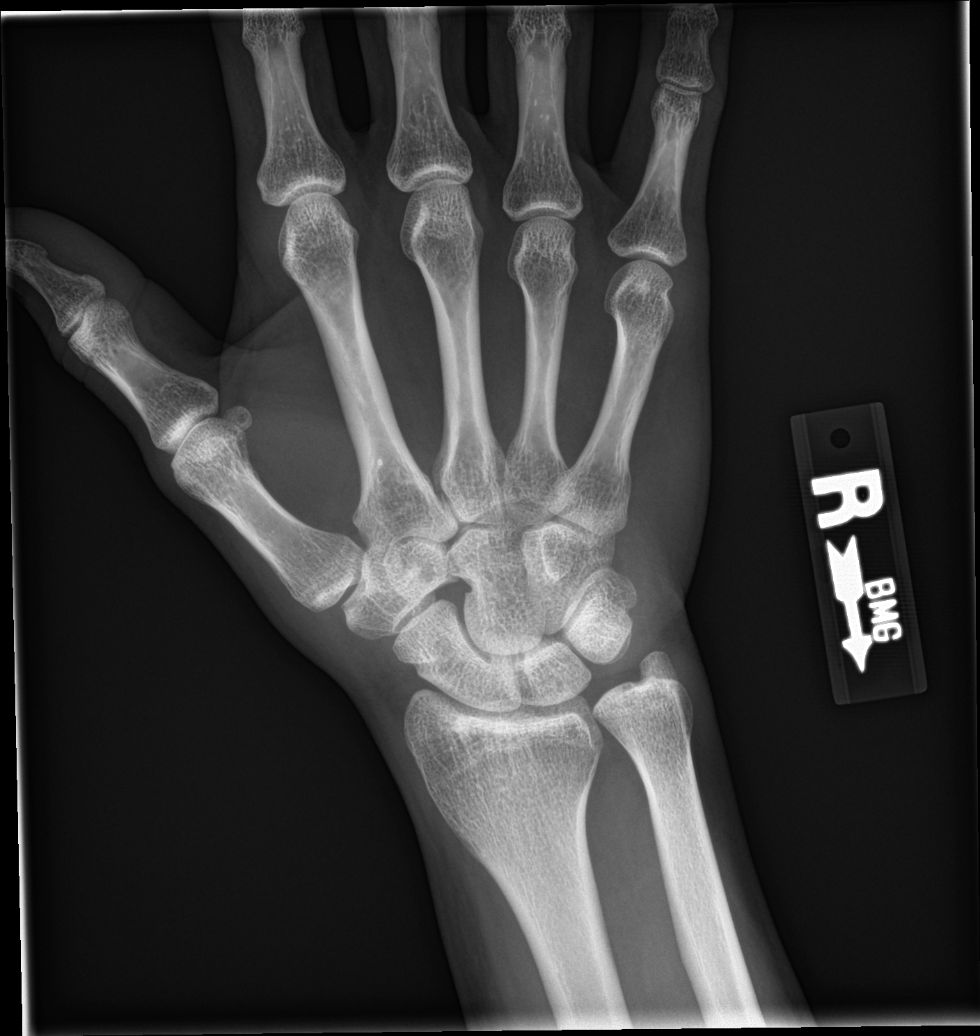

[wrist obl]
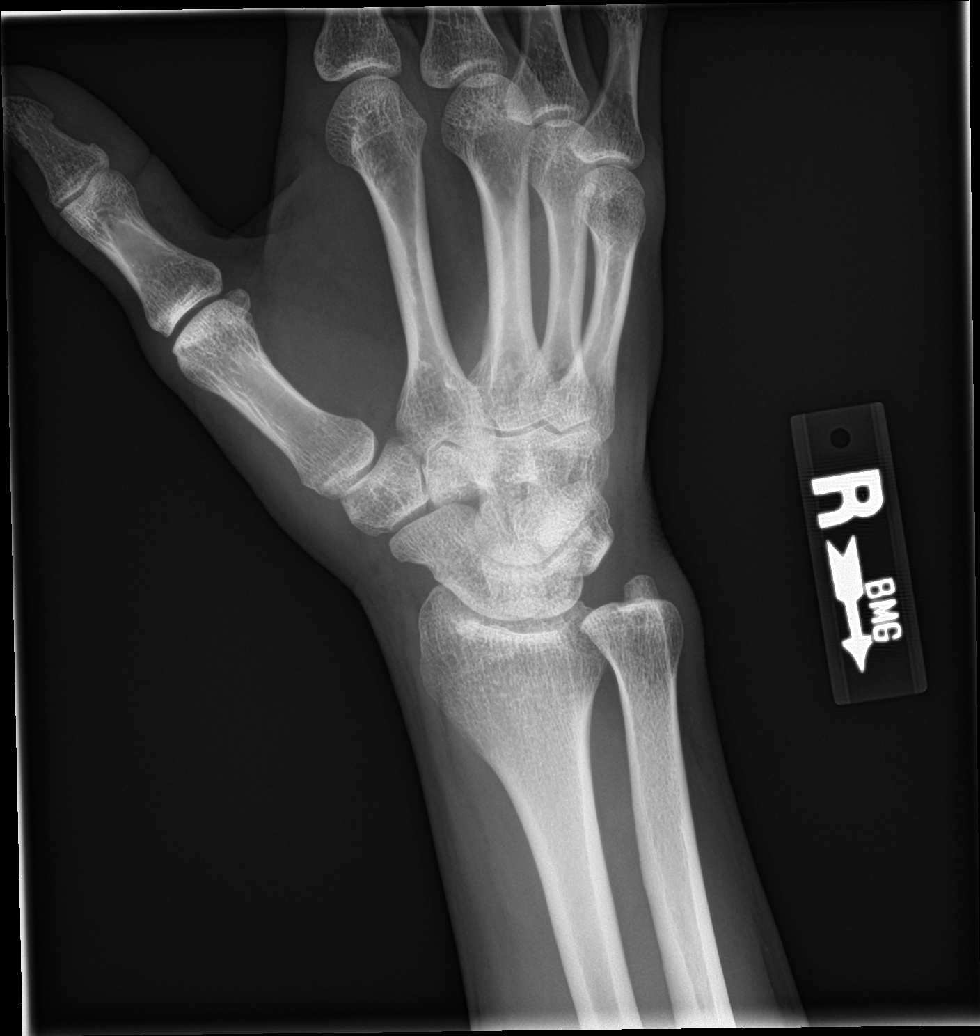

[wrist lat]
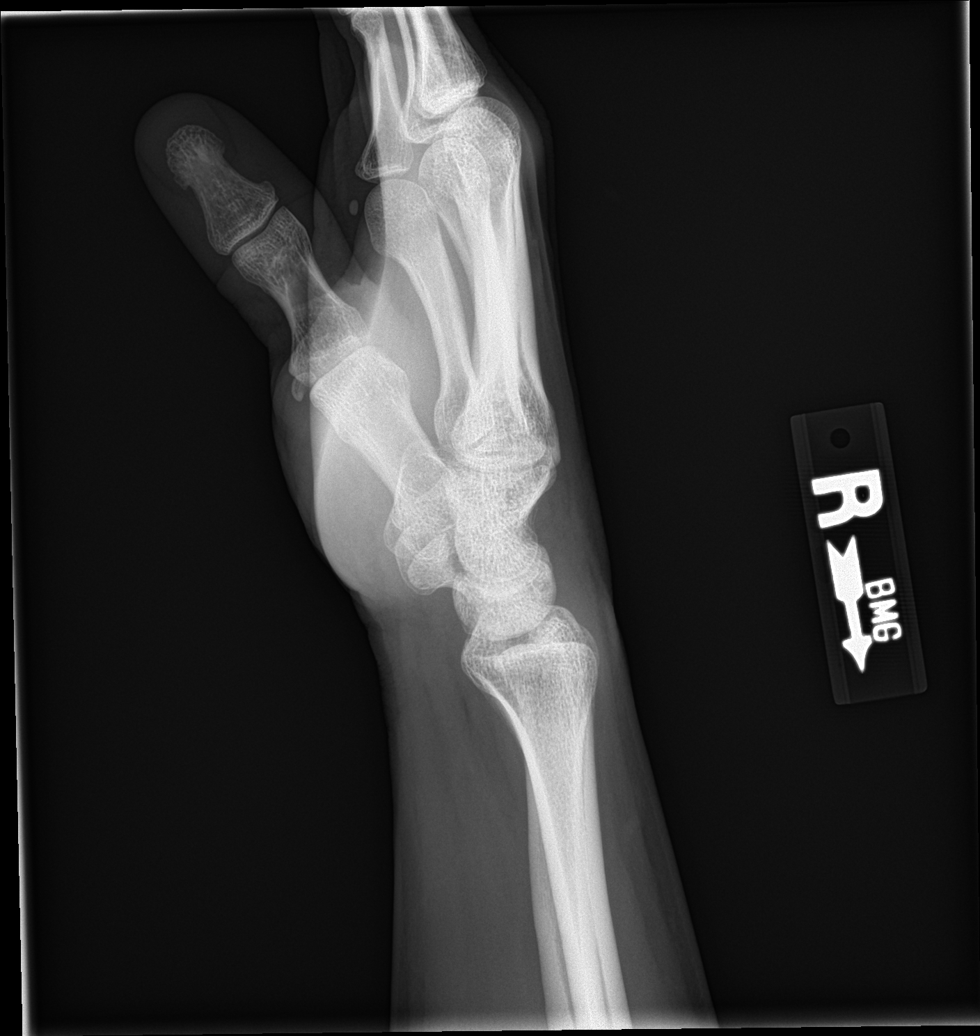

[wrist navicular]
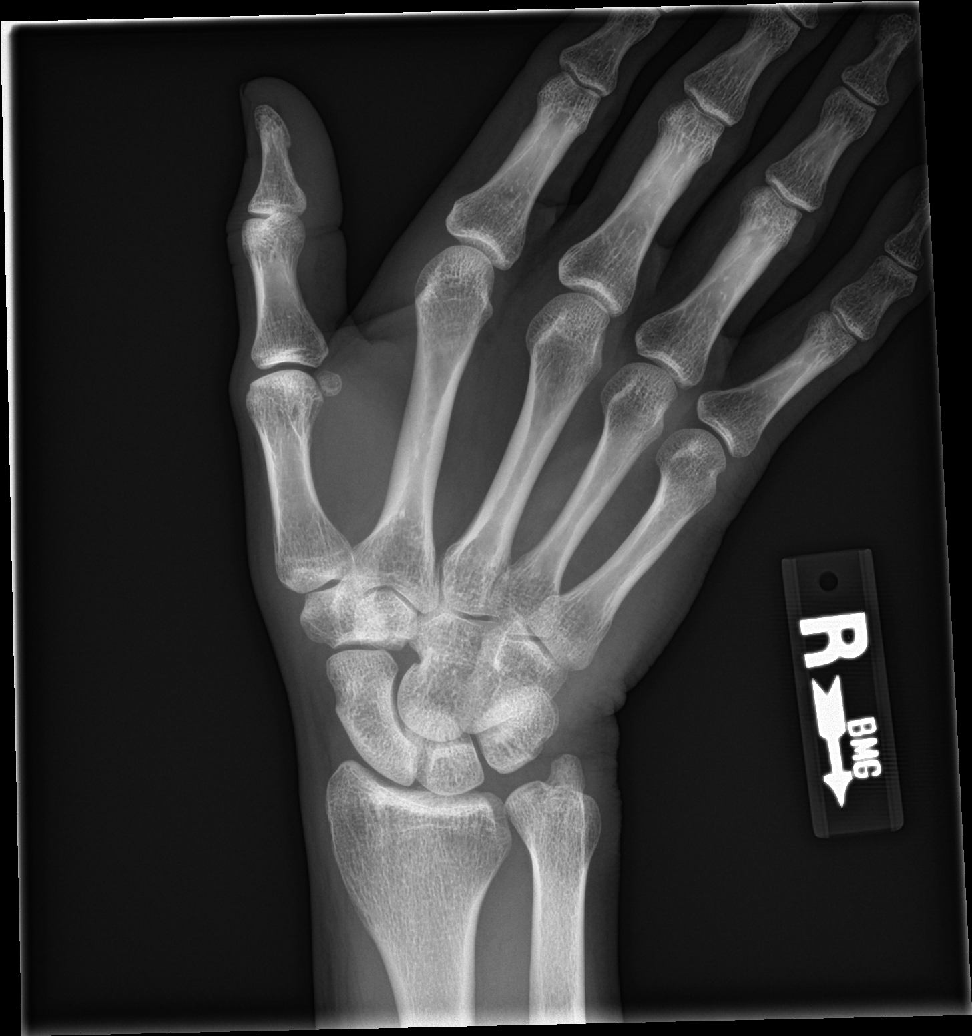

[4 of 4 positions shown; findings below may reference images not displayed]

FINDINGS: There is no evidence of fracture or dislocation. There is no
evidence of arthropathy or other focal bone abnormality. Soft
tissues are unremarkable.
IMPRESSION: Negative.

## 2017-07-19 NOTE — Patient Instructions (Addendum)
Initial x-rays looked okay today. However based on the location of your soreness, will need to wear the brace for the next 2 weeks as a possible scaphoid fracture or hamate fracture could be present that we are not seeing on initial imaging. Over-the-counter Tylenol or ibuprofen is okay if needed, Recheck in 2 weeks, sooner if worsening symptoms.  Keep abrasion on hand clean with soap and water cleansing, bandage over area if needed.  Abrasion An abrasion is a cut or scrape on the outer surface of your skin. An abrasion does not extend through all of the layers of your skin. It is important to care for your abrasion properly to prevent infection. What are the causes? Most abrasions are caused by falling on or gliding across the ground or another surface. When your skin rubs on something, the outer and inner layer of skin rubs off. What are the signs or symptoms? A cut or scrape is the main symptom of this condition. The scrape may be bleeding, or it may appear red or pink. If there was an associated fall, there may be an underlying bruise. How is this diagnosed? An abrasion is diagnosed with a physical exam. How is this treated? Treatment for this condition depends on how large and deep the abrasion is. Usually, your abrasion will be cleaned with water and mild soap. This removes any dirt or debris that may be stuck. An antibiotic ointment may be applied to the abrasion to help prevent infection. A bandage (dressing) may be placed on the abrasion to keep it clean. You may also need a tetanus shot. Follow these instructions at home: Medicines  Take or apply medicines only as directed by your health care provider.  If you were prescribed an antibiotic ointment, finish all of it even if you start to feel better. Wound care  Clean the wound with mild soap and water 2-3 times per day or as directed by your health care provider. Pat your wound dry with a clean towel. Do not rub it.  There are many  different ways to close and cover a wound. Follow instructions from your health care provider about: ? Wound care. ? Dressing changes and removal.  Check your wound every day for signs of infection. Watch for: ? Redness, swelling, or pain. ? Fluid, blood, or pus. General instructions   Keep the dressing dry as directed by your health care provider. Do not take baths, swim, use a hot tub, or do anything that would put your wound underwater until your health care provider approves.  If there is swelling, raise (elevate) the injured area above the level of your heart while you are sitting or lying down.  Keep all follow-up visits as directed by your health care provider. This is important. Contact a health care provider if:  You received a tetanus shot and you have swelling, severe pain, redness, or bleeding at the injection site.  Your pain is not controlled with medicine.  You have increased redness, swelling, or pain at the site of your wound. Get help right away if:  You have a red streak going away from your wound.  You have a fever.  You have fluid, blood, or pus coming from your wound.  You notice a bad smell coming from your wound or your dressing. This information is not intended to replace advice given to you by your health care provider. Make sure you discuss any questions you have with your health care provider. Document Released: 07/22/2005 Document  Revised: 06/12/2016 Document Reviewed: 10/10/2014 Elsevier Interactive Patient Education  2017 [employer]    Wrist Splint A splint is a medical device that keeps an injured part of your body from moving. A splint supports your wrist like a cast, but it is more flexible. It can be removed or loosened. The supporting part of a splint does not completely surround your wrist. It is held in place with an elastic band or straps. You may need a wrist splint if you have hurt your wrist or if you have a condition that causes  swelling. Depending on the type of wrist problem you have, your splint may extend up your arm, onto your hand, or around your thumb. The wrist splint may be worn to:  Support your wrist.  Protect your injury.  Prevent further injury.  Prevent movement.  Reduce pain.  Promote healing. RISKS AND COMPLICATIONS The most dangerous complication of wearing a splint is having a reduced blood supply to your wrist or hand. This can happen if there is a lot of swelling or if the splint is too tight. This results in a condition called compartment syndrome and can cause permanent damage. Symptoms include:  Pain that is getting worse.  Tingling and numbness.  Changes in skin color (paleness or a bluish color).  Cold fingers. Other complications of wearing a splint can include:   Skin irritation that can cause: ? Itching. ? Rash. ? Skin sores. ? Skin infection.  Wrist stiffness. This can occur if you have worn a splint for a long time. HOW TO USE YOUR WRIST SPLINT Your wrist splint should be tight enough to support your wrist without cutting off your blood supply. How long you need to wear the splint depends on the type of wrist problem you have. Your health care provider will instruct you on how to wear your wrist splint and for how long.  Follow all your health care provider's instructions.  Take medicine only as directed by your health care provider.  Keep your wrist elevated above the level of your heart while resting.  Ice may help reduce pain and swelling. ? Place ice in a plastic bag. ? Place a towel between your splint and the bag. ? Leave the ice on for 20 minutes, 2-3 times a day.  Do not get your splint wet.  Do not push objects under your splint to scratch your skin.  Loosen your splint if it feels too tight. Consult your health care provider if you have questions about how tight to wear the splint.  Keep all follow-up visits as directed by your health care provider.  This is important. SEEK MEDICAL CARE IF:  You have wrist pain or swelling that does not go away.  The skin around or under your splint becomes red, itchy, or moist.  You have chills or fever.  Your splint feels too tight or too loose.  Your splint gets damaged. SEEK IMMEDIATE MEDICAL CARE IF: You have symptoms of compartment syndrome. These include:   Pain that is getting worse.  Tingling and numbness.  Changes in skin color (paleness or a bluish color).  Cold fingers. MAKE SURE YOU:  Understand these instructions.  Will watch your condition.  Will get help right away if you are not doing well or get worse. This information is not intended to replace advice given to you by your health care provider. Make sure you discuss any questions you have with your health care provider. Document Released: 09/24/2006 Document  Revised: 11/02/2014 Document Reviewed: 01/23/2014 Elsevier Interactive Patient Education  2017 [employer].    IF you received an x-ray today, you will receive an invoice from [facility]. Please contact [facility] at [phone] with questions or concerns regarding your invoice.   IF you received labwork today, you will receive an invoice from [place]. Please contact [facility] at [phone] with questions or concerns regarding your invoice.   Our billing staff will not be able to assist you with questions regarding bills from these companies.  You will be contacted with the lab results as soon as they are available. The fastest way to get your results is to activate your My Chart account. Instructions are located on the last page of this paperwork. If you have not heard from [place] regarding the results in 2 weeks, please contact this office.

## 2017-07-19 NOTE — Progress Notes (Signed)
Subjective:  By signing my name below, I, Stann Ore, attest that this documentation has been prepared under the direction and in the presence of Meredith Staggers, MD. Electronically Signed: Stann Ore, Scribe. 07/19/2017 , 2:47 PM .  Patient was seen in Room 12 .   Patient ID: Ryan Spencer, male    DOB: 1986-09-20, 31 y.o.   MRN: [id number] Chief Complaint  Patient presents with  . Wrist Pain    pt had a fall yesterday and landed on his right wrist. Pt states he ha had pain every since.    HPI Ryan Spencer is a 31 y.o. male  Patient was long boarding in the streets, riding around his neighborhood yesterday. He was going down a hill and stepped off too fast, landing on his right hand. He had pain at the base of his thumb, and whole wrist has been hurting. He did hit his elbow, but denies any pain in his right elbow or shoulder. He is right hand dominant. He mentions some numbness in his fingers earlier today.   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   Patient Active Problem List   Diagnosis Date Noted  . ADD (attention deficit disorder) 08/14/2015  . History of inguinal hernia repair 05/16/2015  . TOBACCO ABUSE 11/19/2008  . Arthralgia 08/07/2008   Past Medical History:  Diagnosis Date  . Abscess   . Anxiety    valium in use for dentist procedures   . Arthritis   . Headache    not migraines but get bad headaches sometimes   . Inguinal hernia, bilateral   . MRSA (methicillin resistant staph aureus) culture positive    Past Surgical History:  Procedure Laterality Date  . INGUINAL HERNIA REPAIR Bilateral 11/22/2014   Procedure: BILATERAL OPEN INGUINAL HERNIA REPAIR WITH MESH ;  Surgeon: Harriette Bouillon, MD;  Location: [facility] OR;  Service: General;  Laterality: Bilateral;  . INSERTION OF MESH Bilateral 11/22/2014   Procedure: INSERTION OF MESH;  Surgeon: Harriette Bouillon, MD;  Location: [facility] OR;  Service: General;  Laterality:  Bilateral;  . NO PAST SURGERIES     Allergies  Allergen Reactions  . Tramadol Other (See Comments)    Headache   Prior to Admission medications   Not on File   Social History   Social History  . Marital status: Married    Spouse name: N/A  . Number of children: N/A  . 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 Smoking status: Current Every Day Smoker    Packs/day: 0.50    Years: 14.00    Types: Cigarettes    Last attempt to quit: 05/13/2015  . Smokeless tobacco: Never Used  . Alcohol use 0.0 oz/week     Comment: several times per month  . Drug use: No  . Sexual activity: Yes   Other Topics Concern  . Not on file   Social History Narrative   ** Merged History Encounter **       Review of Systems  Constitutional: Negative for fatigue and unexpected weight change.  Eyes: Negative for visual disturbance.  Respiratory: Negative for cough, chest tightness and shortness of breath.   Cardiovascular: Negative for chest pain, palpitations and leg swelling.  Gastrointestinal: Negative for abdominal pain and blood in stool.  Musculoskeletal: Positive for arthralgias, joint swelling and myalgias.  Skin: Negative for rash and  wound.  Neurological: Negative for dizziness, light-headedness and headaches.       Objective:   Physical Exam  Constitutional: He is oriented to person, place, and time. He appears well-developed and well-nourished. No distress.  HENT:  Head: Normocephalic and atraumatic.  Eyes: Pupils are equal, round, and reactive to light. EOM are normal.  Neck: Neck supple.  Cardiovascular: Normal rate.   Pulmonary/Chest: Effort normal. No respiratory distress.  Musculoskeletal: Normal range of motion.  Right shoulder: pain free ROM Right elbow: radial head non tender, pain free ROM, no bony tenderness including radial head Right arm: ulna and ulnar styloid non tender, minimal tenderness at distal  radius, DRUJ non tender Right wrist: slightly tender at hook of hamate, some discomfort at hypothenar eminence Right thumb: tender along scaphoid, as well as base of the first metacarpal Right hand: NVI distally  Neurological: He is alert and oriented to person, place, and time.  Skin: Skin is warm and dry.  Psychiatric: He has a normal mood and affect. His behavior is normal.  Nursing note and vitals reviewed.   Vitals:   07/19/17 1427  BP: 104/68  Pulse: 63  Resp: 16  Temp: 98 F (36.7 C)  TempSrc: Oral  SpO2: 99%  Weight: 161 lb (73 kg)  Height: 5' 11.26" (1.81 m)   Dg Wrist Complete Right  Result Date: 07/19/2017 CLINICAL DATA:  31 year old male with history of fall on outstretched hand yesterday complaining of wrist pain. EXAM: RIGHT WRIST - COMPLETE 3+ VIEW COMPARISON:  Right finger radiograph 04/04/2015. FINDINGS: There is no evidence of fracture or dislocation. There is no evidence of arthropathy or other focal bone abnormality. Soft tissues are unremarkable. IMPRESSION: Negative. Electronically Signed   By: Trudie Reed M.D.   On: 07/19/2017 15:05       Assessment & Plan:   Ryan Spencer is a 30 y.o. male Right wrist pain - Plan: DG Wrist Complete Right, Thumb spica  Contusion of right wrist, initial encounter - Plan: Thumb spica  Abrasion of right hand, initial encounter  FOOSH injury skateboarding yesterday to dominant hand. Tenderness to palpation over scaphoid and hook of hamate without apparent fracture on initial imaging. Few abrasions on the volar hand without signs of infection or deep components, no foreign bodies visible.   - Thumb spica splint for 2 weeks and repeat exam and x-ray as occult scaphoid or hook of hamate fracture possible.  -Tylenol/Motrin over-the-counter as needed, wound care discussed for abrasion, RTC precautions given.  No orders of the defined types were placed in this encounter.  Patient Instructions   Initial x-rays looked  okay today. However based on the location of your soreness, will need to wear the brace for the next 2 weeks as a possible scaphoid fracture or hamate fracture could be present that we are not seeing on initial imaging. Over-the-counter Tylenol or ibuprofen is okay if needed, Recheck in 2 weeks, sooner if worsening symptoms.  Keep abrasion on hand clean with soap and water cleansing, bandage over area if needed.  Abrasion An abrasion is a cut or scrape on the outer surface of your skin. An abrasion does not extend through all of the layers of your skin. It is important to care for your abrasion properly to prevent infection. What are the causes? Most abrasions are caused by falling on or gliding across the ground or another surface. When your skin rubs on something, the outer and inner layer of skin rubs off. What are the  signs or symptoms? A cut or scrape is the main symptom of this condition. The scrape may be bleeding, or it may appear red or pink. If there was an associated fall, there may be an underlying bruise. How is this diagnosed? An abrasion is diagnosed with a physical exam. How is this treated? Treatment for this condition depends on how large and deep the abrasion is. Usually, your abrasion will be cleaned with water and mild soap. This removes any dirt or debris that may be stuck. An antibiotic ointment may be applied to the abrasion to help prevent infection. A bandage (dressing) may be placed on the abrasion to keep it clean. You may also need a tetanus shot. Follow these instructions at home: Medicines  Take or apply medicines only as directed by your health care provider.  If you were prescribed an antibiotic ointment, finish all of it even if you start to feel better. Wound care  Clean the wound with mild soap and water 2-3 times per day or as directed by your health care provider. Pat your wound dry with a clean towel. Do not rub it.  There are many different ways to  close and cover a wound. Follow instructions from your health care provider about: ? Wound care. ? Dressing changes and removal.  Check your wound every day for signs of infection. Watch for: ? Redness, swelling, or pain. ? Fluid, blood, or pus. General instructions   Keep the dressing dry as directed by your health care provider. Do not take baths, swim, use a hot tub, or do anything that would put your wound underwater until your health care provider approves.  If there is swelling, raise (elevate) the injured area above the level of your heart while you are sitting or lying down.  Keep all follow-up visits as directed by your health care provider. This is important. Contact a health care provider if:  You received a tetanus shot and you have swelling, severe pain, redness, or bleeding at the injection site.  Your pain is not controlled with medicine.  You have increased redness, swelling, or pain at the site of your wound. Get help right away if:  You have a red streak going away from your wound.  You have a fever.  You have fluid, blood, or pus coming from your wound.  You notice a bad smell coming from your wound or your dressing. This information is not intended to replace advice given to you by your health care provider. Make sure you discuss any questions you have with your health care provider. Document Released: 07/22/2005 Document Revised: 06/12/2016 Document Reviewed: 10/10/2014 Elsevier Interactive Patient Education  2017 Elsevier Inc.    Wrist Splint A splint is a medical device that keeps an injured part of your body from moving. A splint supports your wrist like a cast, but it is more flexible. It can be removed or loosened. The supporting part of a splint does not completely surround your wrist. It is held in place with an elastic band or straps. You may need a wrist splint if you have hurt your wrist or if you have a condition that causes swelling. Depending  on the type of wrist problem you have, your splint may extend up your arm, onto your hand, or around your thumb. The wrist splint may be worn to:  Support your wrist.  Protect your injury.  Prevent further injury.  Prevent movement.  Reduce pain.  Promote healing. RISKS AND COMPLICATIONS The  most dangerous complication of wearing a splint is having a reduced blood supply to your wrist or hand. This can happen if there is a lot of swelling or if the splint is too tight. This results in a condition called compartment syndrome and can cause permanent damage. Symptoms include:  Pain that is getting worse.  Tingling and numbness.  Changes in skin color (paleness or a bluish color).  Cold fingers. Other complications of wearing a splint can include:   Skin irritation that can cause: ? Itching. ? Rash. ? Skin sores. ? Skin infection.  Wrist stiffness. This can occur if you have worn a splint for a long time. HOW TO USE YOUR WRIST SPLINT Your wrist splint should be tight enough to support your wrist without cutting off your blood supply. How long you need to wear the splint depends on the type of wrist problem you have. Your health care provider will instruct you on how to wear your wrist splint and for how long.  Follow all your health care provider's instructions.  Take medicine only as directed by your health care provider.  Keep your wrist elevated above the level of your heart while resting.  Ice may help reduce pain and swelling. ? Place ice in a plastic bag. ? Place a towel between your splint and the bag. ? Leave the ice on for 20 minutes, 2-3 times a day.  Do not get your splint wet.  Do not push objects under your splint to scratch your skin.  Loosen your splint if it feels too tight. Consult your health care provider if you have questions about how tight to wear the splint.  Keep all follow-up visits as directed by your health care provider. This is  important. SEEK MEDICAL CARE IF:  You have wrist pain or swelling that does not go away.  The skin around or under your splint becomes red, itchy, or moist.  You have chills or fever.  Your splint feels too tight or too loose.  Your splint gets damaged. SEEK IMMEDIATE MEDICAL CARE IF: You have symptoms of compartment syndrome. These include:   Pain that is getting worse.  Tingling and numbness.  Changes in skin color (paleness or a bluish color).  Cold fingers. MAKE SURE YOU:  Understand these instructions.  Will watch your condition.  Will get help right away if you are not doing well or get worse. This information is not intended to replace advice given to you by your health care provider. Make sure you discuss any questions you have with your health care provider. Document Released: 09/24/2006 Document Revised: 11/02/2014 Document Reviewed: 01/23/2014 Elsevier Interactive Patient Education  2017 [employer].    IF you received an x-ray today, you will receive an invoice from [facility]. Please contact [facility] at [phone] with questions or concerns regarding your invoice.   IF you received labwork today, you will receive an invoice from [place]. Please contact [facility] at [phone] with questions or concerns regarding your invoice.   Our billing staff will not be able to assist you with questions regarding bills from these companies.  You will be contacted with the lab results as soon as they are available. The fastest way to get your results is to activate your My Chart account. Instructions are located on the last page of this paperwork. If you have not heard from [place] regarding the results in 2 weeks, please contact this office.       I personally performed  the services described in this documentation, which was scribed in my presence. The recorded information has been reviewed and considered for accuracy and completeness, addended by  me as needed, and agree with information above.  Signed,   Meredith Staggers, MD Primary Care at [facility].  07/19/17 3:26 PM

## 2017-07-28 DIAGNOSIS — F19980 Other psychoactive substance use, unspecified with psychoactive substance-induced anxiety disorder: Secondary | ICD-10-CM | POA: Diagnosis not present

## 2017-07-28 DIAGNOSIS — F988 Other specified behavioral and emotional disorders with onset usually occurring in childhood and adolescence: Secondary | ICD-10-CM | POA: Diagnosis not present

## 2017-07-28 DIAGNOSIS — F1120 Opioid dependence, uncomplicated: Secondary | ICD-10-CM | POA: Diagnosis not present

## 2017-07-28 DIAGNOSIS — F1920 Other psychoactive substance dependence, uncomplicated: Secondary | ICD-10-CM | POA: Diagnosis not present

## 2017-07-28 DIAGNOSIS — Z6822 Body mass index (BMI) 22.0-22.9, adult: Secondary | ICD-10-CM | POA: Diagnosis not present

## 2017-07-28 DIAGNOSIS — M1990 Unspecified osteoarthritis, unspecified site: Secondary | ICD-10-CM | POA: Diagnosis not present

## 2017-07-28 MED FILL — SUBOXONE 8 MG-2 MG SL FILM: 8-2 | 28 days supply | Qty: 42 | Fill #0

## 2017-08-03 ENCOUNTER — Encounter: Payer: Self-pay | Admitting: Family Medicine

## 2017-08-03 ENCOUNTER — Ambulatory Visit (INDEPENDENT_AMBULATORY_CARE_PROVIDER_SITE_OTHER): Payer: 59

## 2017-08-03 ENCOUNTER — Ambulatory Visit (INDEPENDENT_AMBULATORY_CARE_PROVIDER_SITE_OTHER): Payer: 59 | Admitting: Family Medicine

## 2017-08-03 VITALS — BP 104/68 | HR 65 | Temp 98.5°F | Resp 16 | Ht 71.26 in | Wt 162.6 lb

## 2017-08-03 DIAGNOSIS — M79644 Pain in right finger(s): Secondary | ICD-10-CM

## 2017-08-03 DIAGNOSIS — S6991XA Unspecified injury of right wrist, hand and finger(s), initial encounter: Secondary | ICD-10-CM | POA: Diagnosis not present

## 2017-08-03 DIAGNOSIS — M25531 Pain in right wrist: Secondary | ICD-10-CM

## 2017-08-03 DIAGNOSIS — M79641 Pain in right hand: Secondary | ICD-10-CM | POA: Diagnosis not present

## 2017-08-03 IMAGING — DX DG FINGER THUMB 2+V*R*
3 series · 3 of 3 positions shown · non-contrast
Comparison: Radiographs [DATE].

CLINICAL DATA: Right thumb pain after fall.

EXAM:
RIGHT THUMB 2+V

[finger ap]
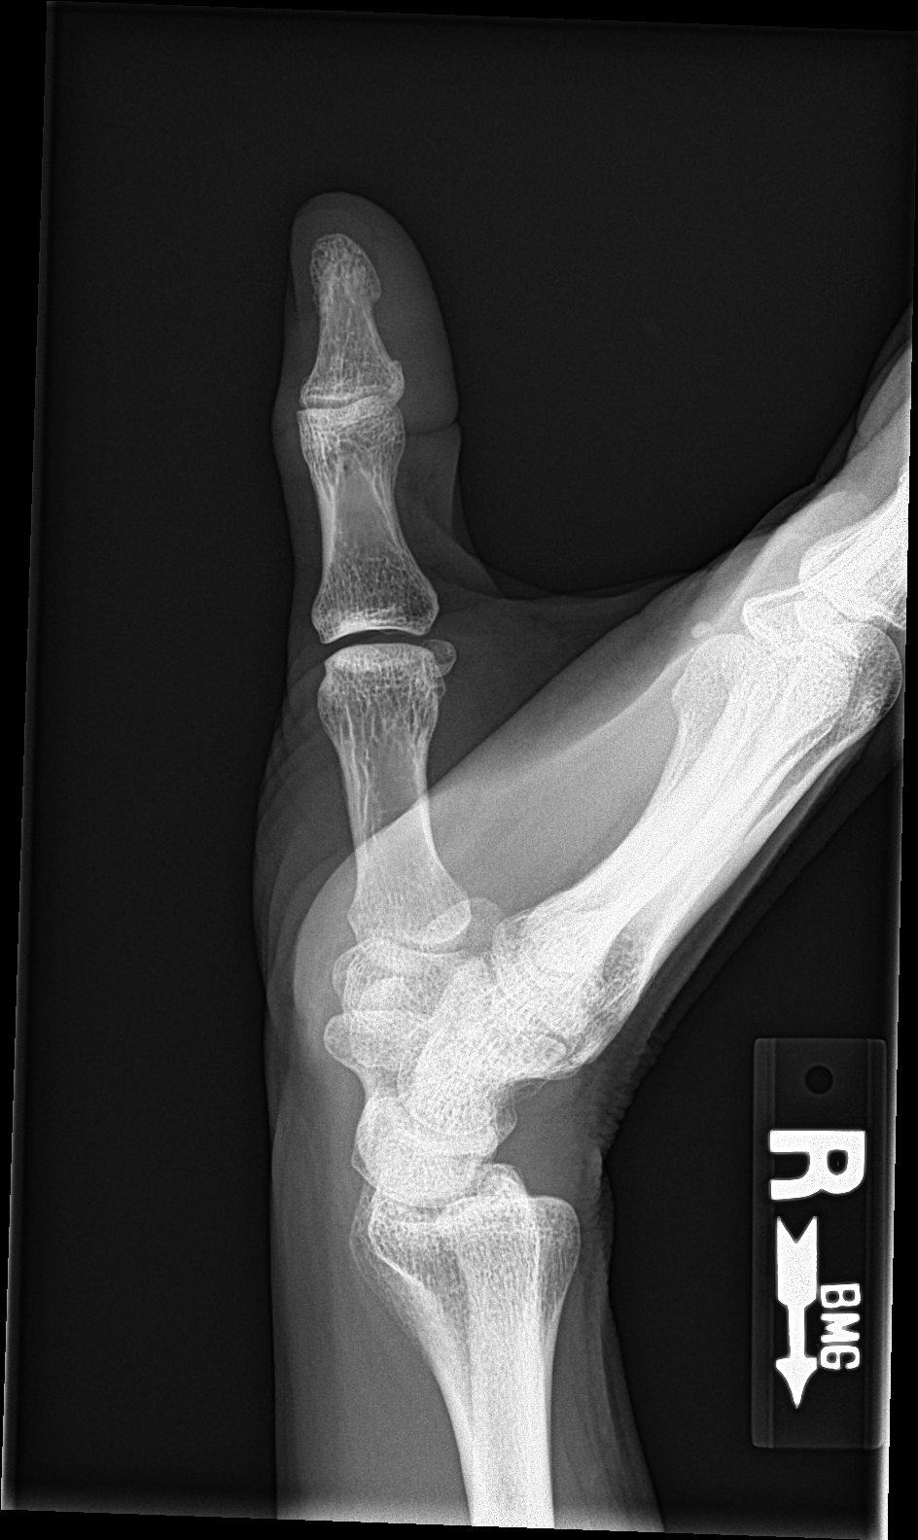

[finger obl]
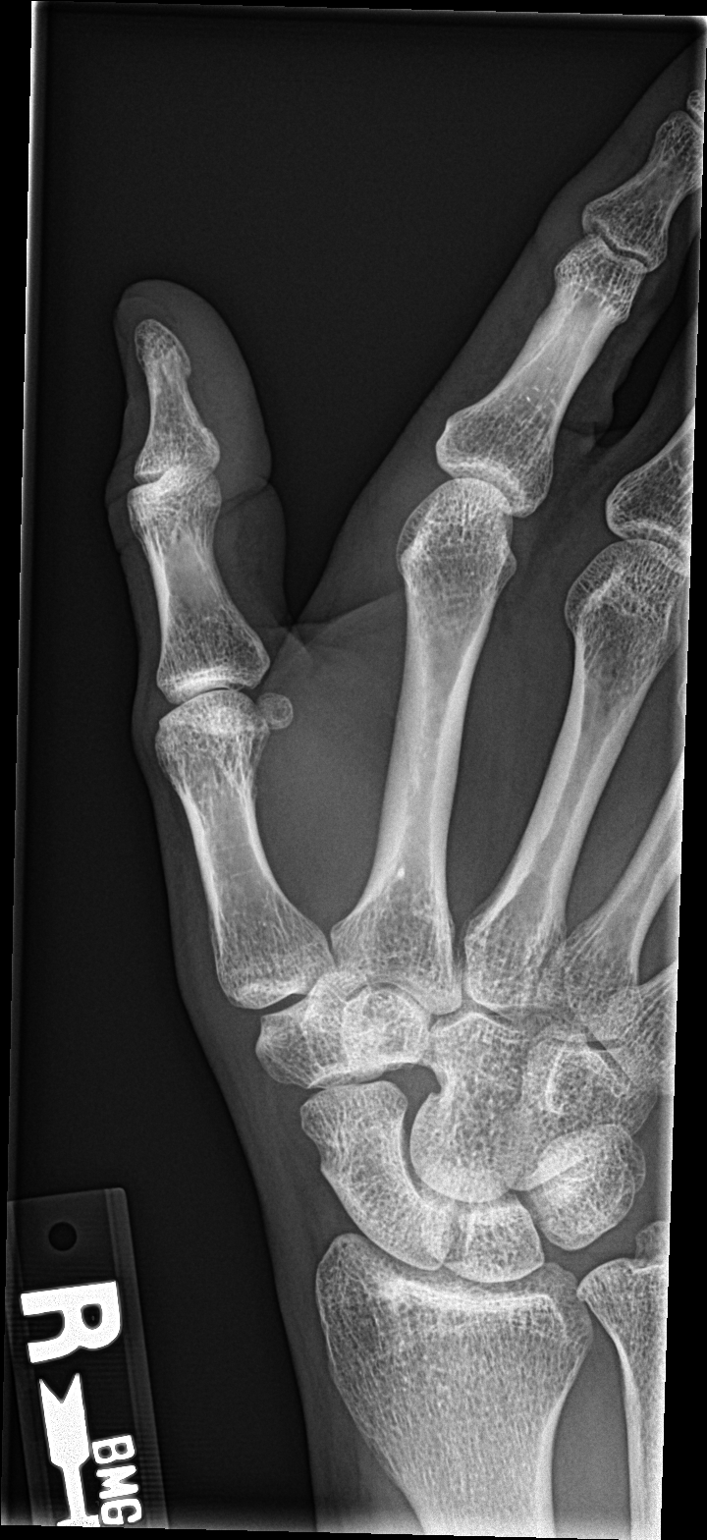

[finger lat]
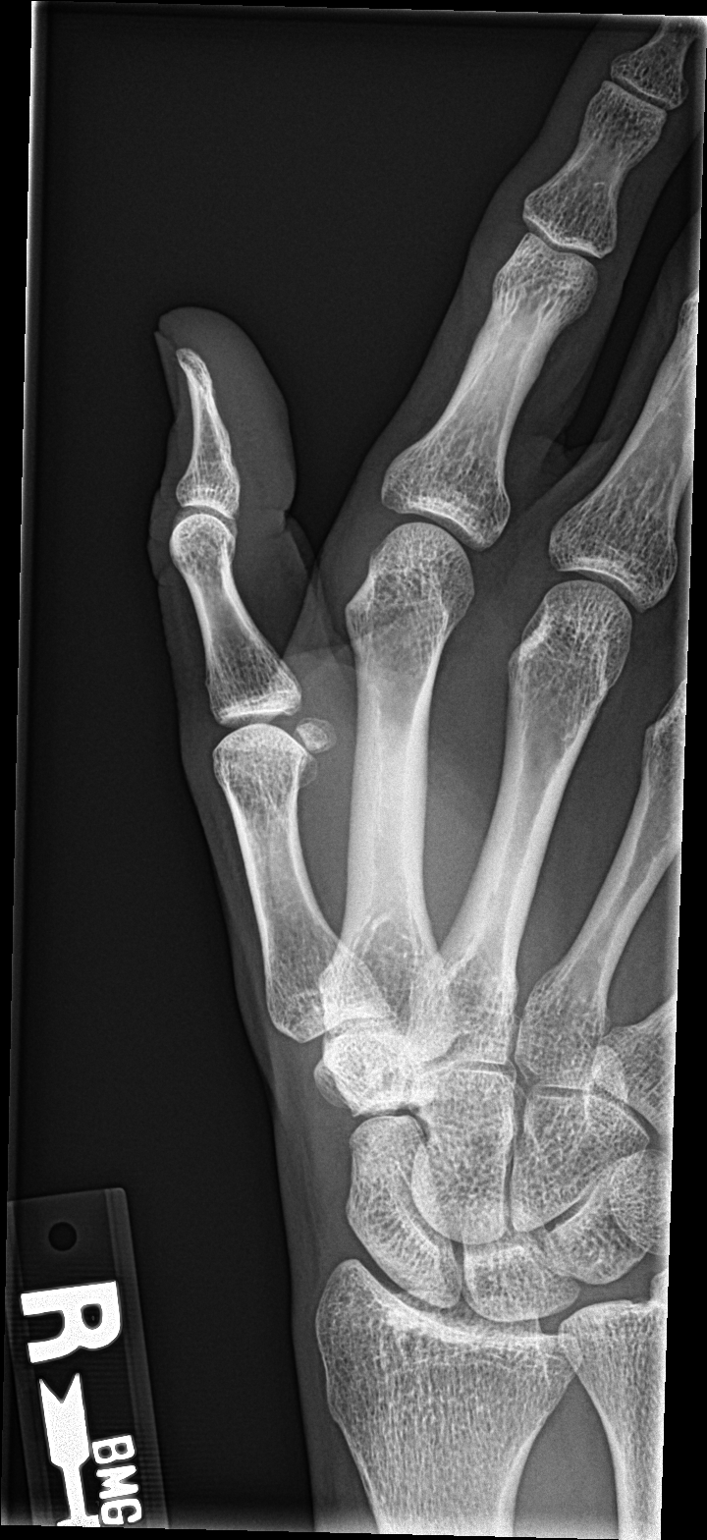

[3 of 3 positions shown; findings below may reference images not displayed]

FINDINGS: There is no evidence of fracture or dislocation. There is no
evidence of arthropathy or other focal bone abnormality. Soft
tissues are unremarkable
IMPRESSION: Normal right thumb.

## 2017-08-03 IMAGING — DX DG WRIST COMPLETE 3+V*R*
4 series · 4 of 4 positions shown · non-contrast
Comparison: Radiographs [DATE].

CLINICAL DATA: Right wrist pain after fall.

EXAM:
RIGHT WRIST - COMPLETE 3+ VIEW

[wrist pa]
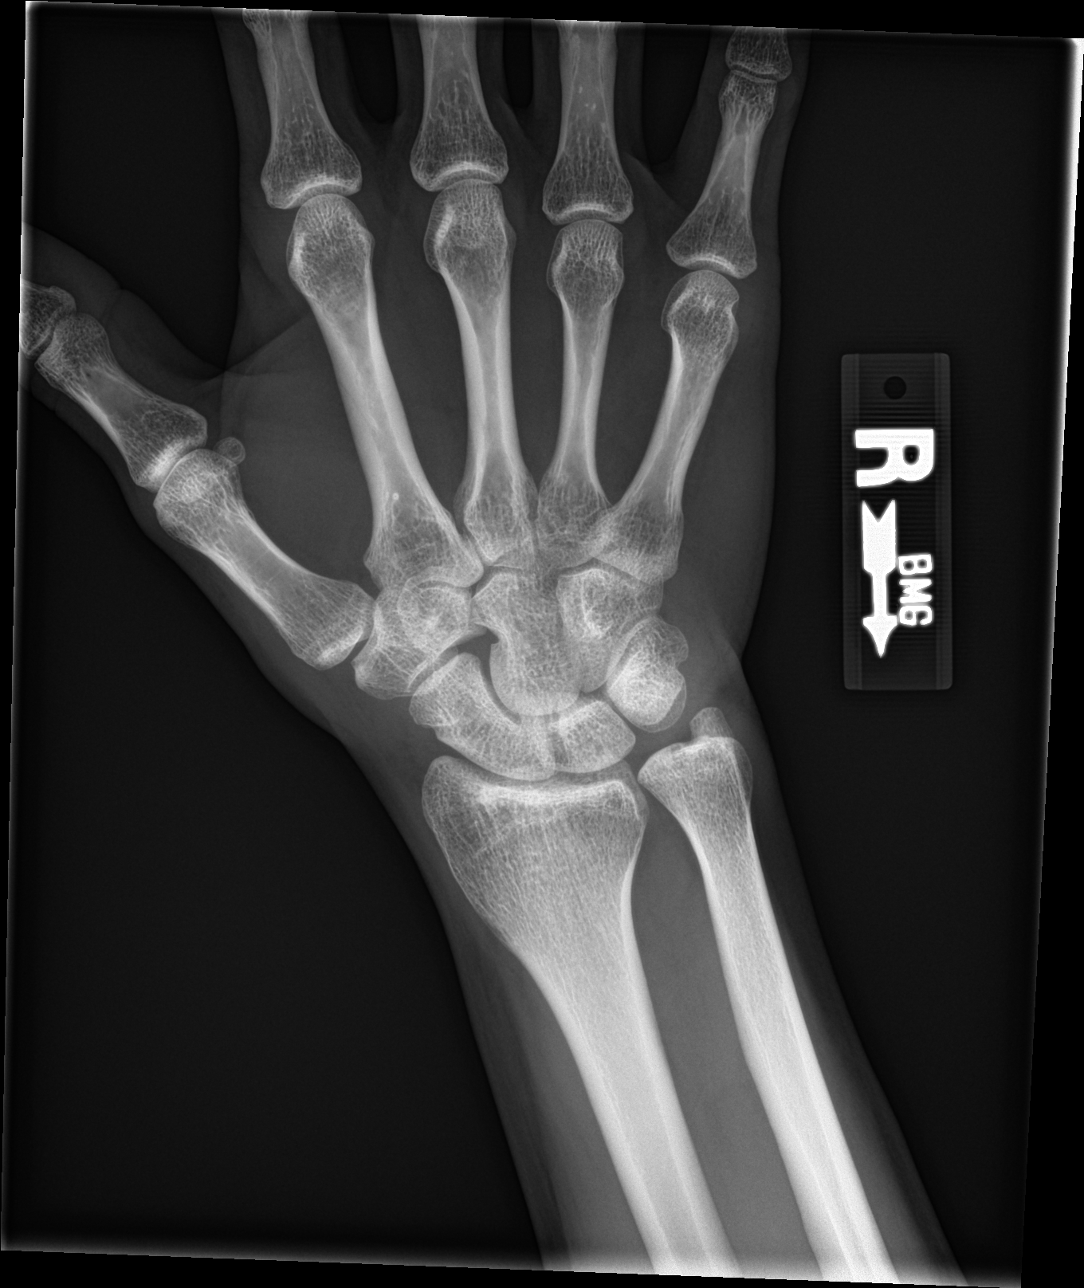

[wrist obl]
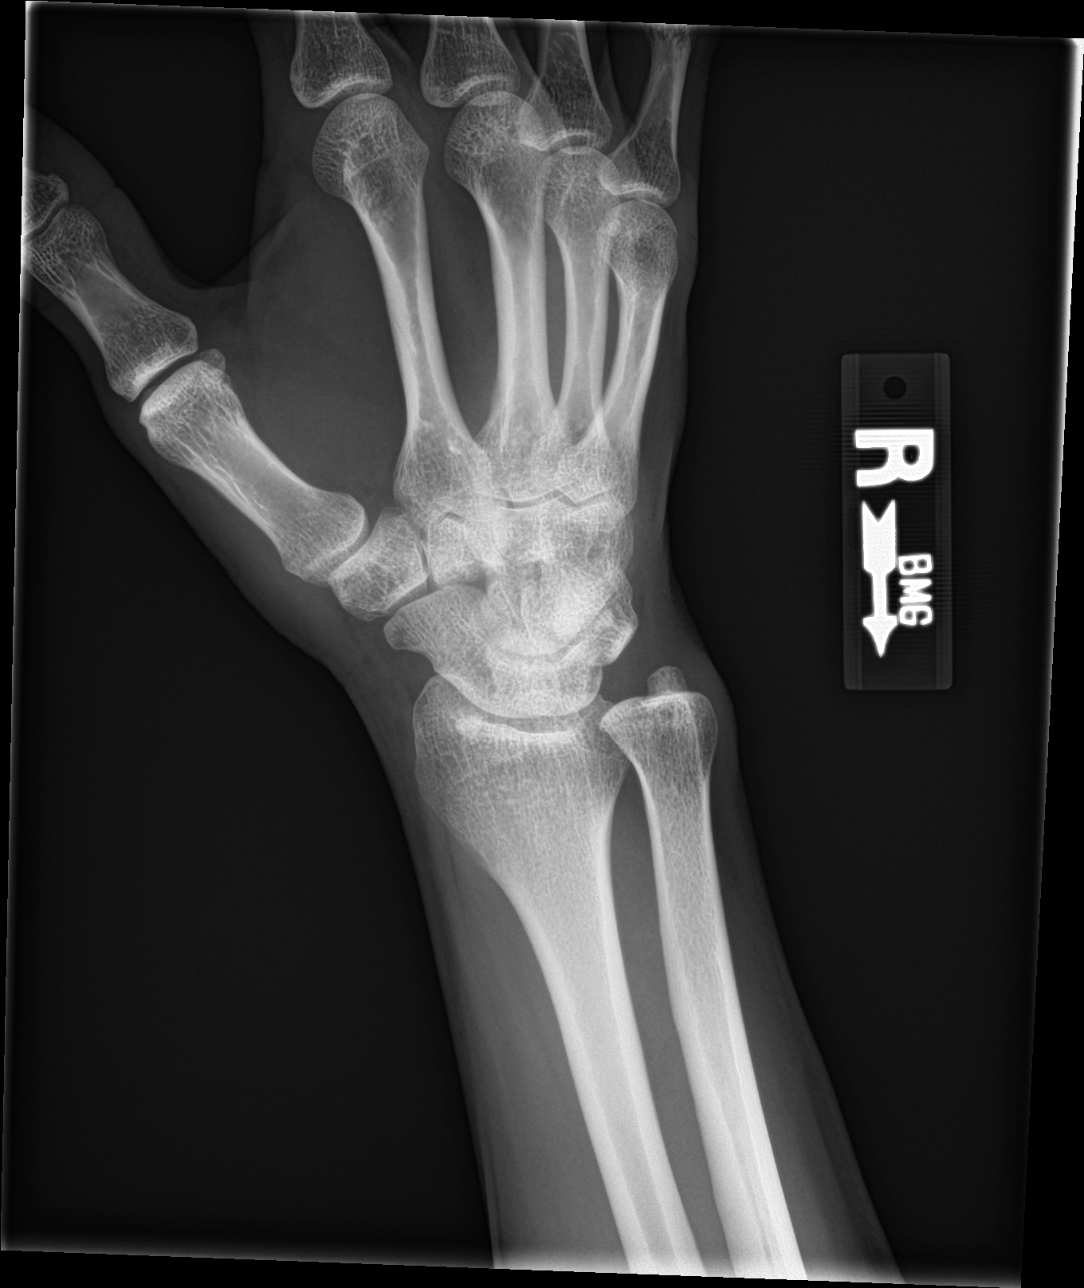

[wrist lat]
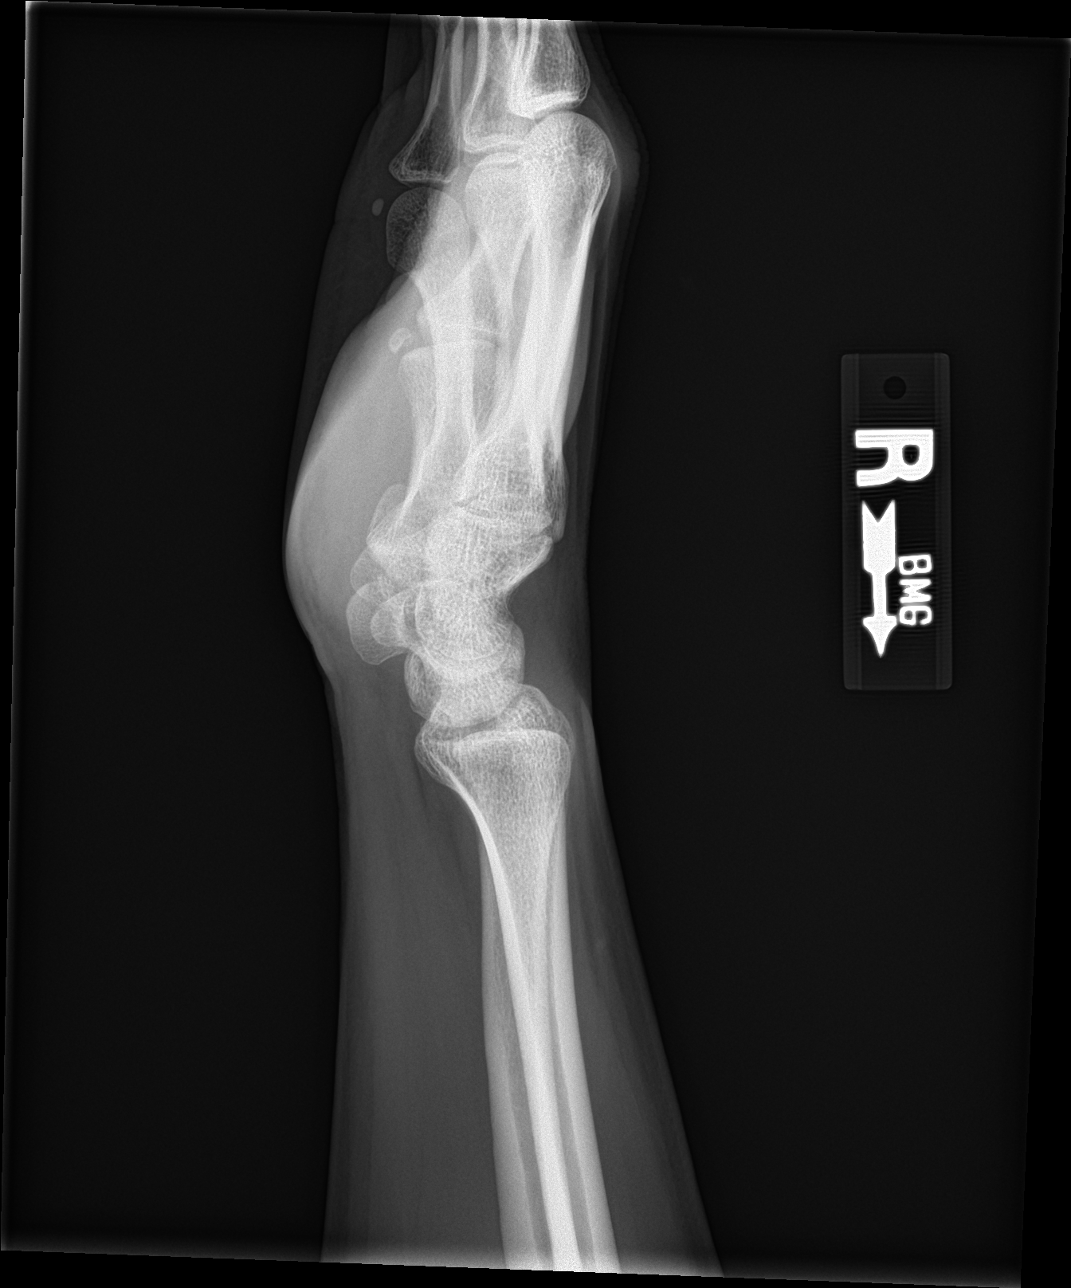

[wrist navicular]
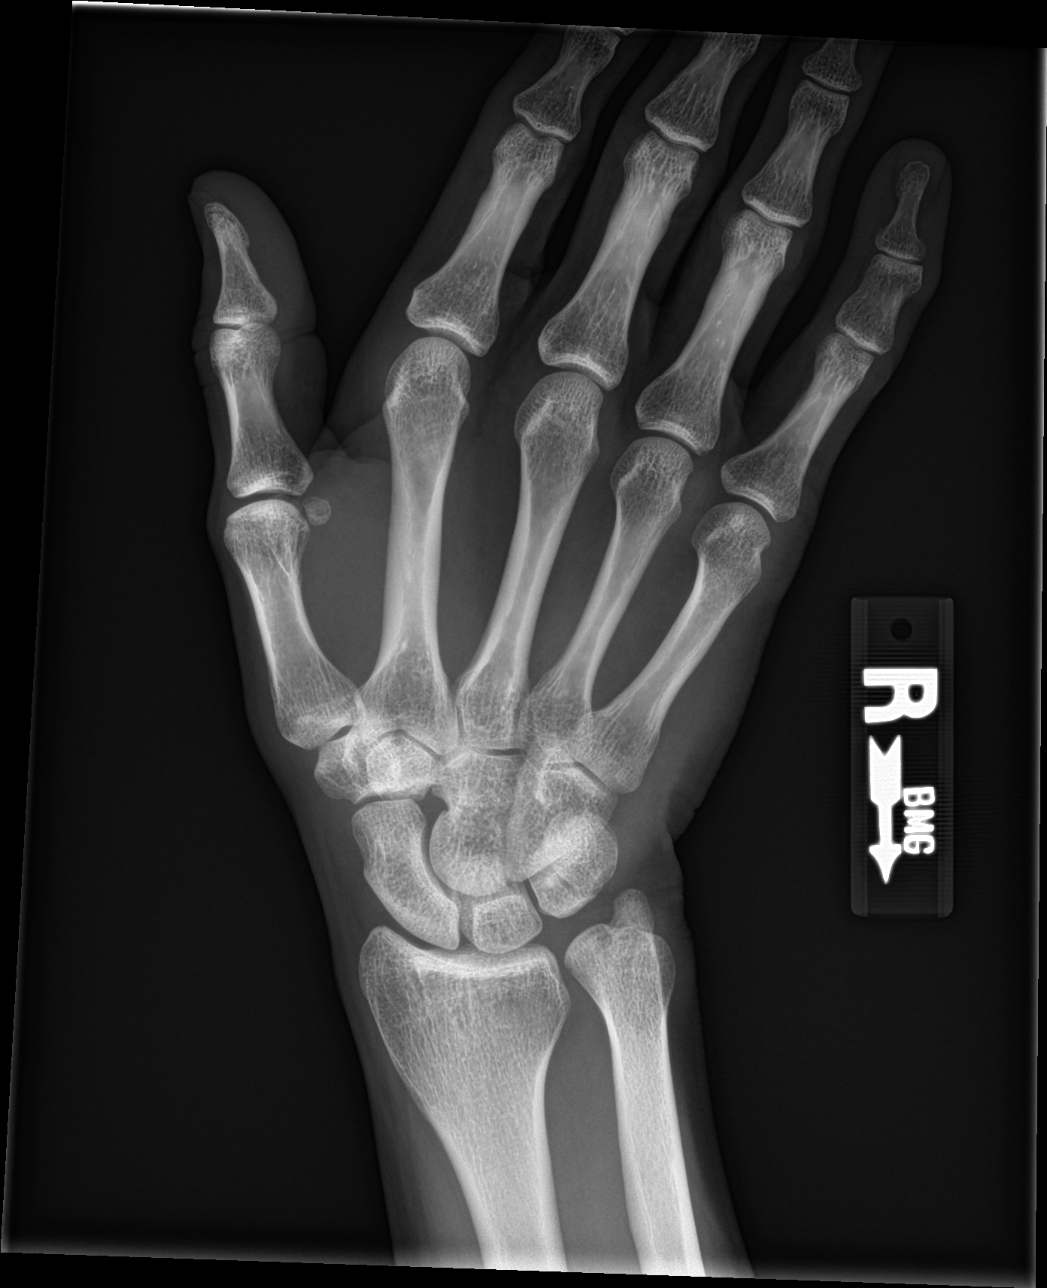

[4 of 4 positions shown; findings below may reference images not displayed]

FINDINGS: There is no evidence of fracture or dislocation. There is no
evidence of arthropathy or other focal bone abnormality. Soft
tissues are unremarkable.
IMPRESSION: Normal right wrist.

## 2017-08-03 NOTE — Patient Instructions (Addendum)
Repeat x-rays today are reassuring, no apparent fractures. Some of your discomfort may be due to decrease use of hand in the wrist. I will refer you to physical therapy to try to see if we can loosen up some of that stiffness and help with some of the soreness. If symptoms are worsening with physical therapy, I can refer you to a hand specialist. Between now and physical therapy, try coming out of brace 2-3 times per day for gentle range of motion at wrist and thumb.  IF you received an x-ray today, you will receive an invoice from [facility]. Please contact [facility] at [phone] with questions or concerns regarding your invoice.   IF you received labwork today, you will receive an invoice from [place]. Please contact [facility] at [phone] with questions or concerns regarding your invoice.   Our billing staff will not be able to assist you with questions regarding bills from these companies.  You will be contacted with the lab results as soon as they are available. The fastest way to get your results is to activate your My Chart account. Instructions are located on the last page of this paperwork. If you have not heard from [place] regarding the results in 2 weeks, please contact this office.

## 2017-08-03 NOTE — Progress Notes (Signed)
Subjective:  By signing my name below, I, Ryan Spencer, attest that this documentation has been prepared under the direction and in the presence of Shade Flood, MD Electronically Signed: [name], ED Scribe 08/03/2017 at 6:20 PM.   Patient ID: Ryan Spencer, male    DOB: 12-25-1985, 30 y.o.   MRN: [id number]  Chief Complaint  Patient presents with  . Follow-up    wrist feels a little better but feels like he is becoming dependent on the brace    HPI Ryan Spencer is a 31 y.o. male who presents to Primary Care at [facility] for follow-up on R wrist pain. Seen 9/24. At that time he had fallen off a long board the day prior, fallen on outstretched R hand. Pt had pain at base of thumb, including scaphoid and hook of hamate. XR was negative for fracture. Abrasions but no foreign bodies. Placed in thumb spica splint. Here for repeat exam.   Today, pt presents with some stiffness, sharp pains at base of thumb and soreness in thumb overall for the past few days. He suspects that he is becoming dependent on his brace, noting that pain is worse after initial movement following removing the brace.   Patient Active Problem List   Diagnosis Date Noted  . ADD (attention deficit disorder) 08/14/2015  . History of inguinal hernia repair 05/16/2015  . TOBACCO ABUSE 11/19/2008  . Arthralgia 08/07/2008   Past Medical History:  Diagnosis Date  . Abscess   . Anxiety    valium in use for dentist procedures   . Arthritis   . Headache    not migraines but get bad headaches sometimes   . Inguinal hernia, bilateral   . MRSA (methicillin resistant staph aureus) culture positive    Past Surgical History:  Procedure Laterality Date  . INGUINAL HERNIA REPAIR Bilateral 11/22/2014   Procedure: BILATERAL OPEN INGUINAL HERNIA REPAIR WITH MESH ;  Surgeon: Harriette Bouillon, MD;  Location: [facility] OR;  Service: General;  Laterality: Bilateral;  . INSERTION OF MESH Bilateral 11/22/2014   Procedure: INSERTION  OF MESH;  Surgeon: Harriette Bouillon, MD;  Location: [facility] OR;  Service: General;  Laterality: Bilateral;  . NO PAST SURGERIES     Allergies  Allergen Reactions  . Tramadol Other (See Comments)    Headache   Prior to Admission medications   Not on File   Social History   Social History  . Marital status: Married    Spouse name: N/A  . Number of children: N/A  . 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 Smoking status: Current Every Day Smoker    Packs/day: 0.50    Years: 14.00    Types: Cigarettes    Last attempt to quit: 05/13/2015  . Smokeless tobacco: Never Used  . Alcohol use 0.0 oz/week     Comment: several times per month  . Drug use: No  . Sexual activity: Yes   Other Topics Concern  . Not on file   Social History Narrative   ** Merged History Encounter **       Review of Systems  Musculoskeletal: Positive for arthralgias.      Objective:   Physical Exam  Constitutional: He is oriented to person, place, and time. He appears well-developed and well-nourished. No distress.  HENT:  Head: Normocephalic and atraumatic.  Eyes: Conjunctivae and EOM are normal.  Neck: Neck supple. No tracheal  deviation present.  Cardiovascular: Normal rate.   Pulmonary/Chest: Effort normal. No respiratory distress.  Musculoskeletal: Normal range of motion.  R hand: Pulling sensation with flexion of wrist at radial aspect of muscles. Tenderness along distal thumb metacarpal. No soft tissue swelling. MCP and IP are nontender. Slight volar wrist tenderness. ROM overall intact at hand and wrist.  Neurological: He is alert and oriented to person, place, and time.  Skin: Skin is warm and dry.  Psychiatric: He has a normal mood and affect. His behavior is normal.  Nursing note and vitals reviewed.  Vitals:   08/03/17 1723  BP: 104/68  Pulse: 65  Resp: 16  Temp: 98.5 F (36.9 C)  SpO2: 100%  Weight: 162 lb 9.6 oz (73.8 kg)   Height: 5' 11.26" (1.81 m)  Dg Wrist Complete Right  Result Date: 08/03/2017 CLINICAL DATA:  Right wrist pain after fall. EXAM: RIGHT WRIST - COMPLETE 3+ VIEW COMPARISON:  Radiographs of July 19, 2017. FINDINGS: There is no evidence of fracture or dislocation. There is no evidence of arthropathy or other focal bone abnormality. Soft tissues are unremarkable. IMPRESSION: Normal right wrist. Electronically Signed   By: Lupita Raider, M.D.   On: 08/03/2017 18:52   Dg Finger Thumb Right  Result Date: 08/03/2017 CLINICAL DATA:  Right thumb pain after fall. EXAM: RIGHT THUMB 2+V COMPARISON:  Radiographs of July 19, 2017. FINDINGS: There is no evidence of fracture or dislocation. There is no evidence of arthropathy or other focal bone abnormality. Soft tissues are unremarkable IMPRESSION: Normal right thumb. Electronically Signed   By: Lupita Raider, M.D.   On: 08/03/2017 18:54       Assessment & Plan:   Ryan Spencer is a 31 y.o. male Right wrist pain - Plan: DG Wrist Complete Right, Ambulatory referral to Physical Therapy  Pain of right thumb - Plan: DG Finger Thumb Right, Ambulatory referral to Physical Therapy  Fall with skateboarding as above. Initially did have some slight scaphoid, as well as hamate tenderness, but minimal symptoms at present and Repeat x-rays today are reassuring without fracture. Suspect some of his discomfort is due to stiffness from wearing brace.    -Refer to hand physical therapy, can try gentle range of motion outside of brace at present.  - If difficulty with therapy, or increasing pain, referred to hand surgeon.   Patient Instructions   Repeat x-rays today are reassuring, no apparent fractures. Some of your discomfort may be due to decrease use of hand in the wrist. I will refer you to physical therapy to try to see if we can loosen up some of that stiffness and help with some of the soreness. If symptoms are worsening with physical therapy, I can  refer you to a hand specialist. Between now and physical therapy, try coming out of brace 2-3 times per day for gentle range of motion at wrist and thumb.  IF you received an x-ray today, you will receive an invoice from [facility]. Please contact [facility] at [phone] with questions or concerns regarding your invoice.   IF you received labwork today, you will receive an invoice from [place]. Please contact [facility] at [phone] with questions or concerns regarding your invoice.   Our billing staff will not be able to assist you with questions regarding bills from these companies.  You will be contacted with the lab results as soon as they are available. The fastest way to get your results is to activate your My  Chart account. Instructions are located on the last page of this paperwork. If you have not heard from [place] regarding the results in 2 weeks, please contact this office.       I personally performed the services described in this documentation, which was scribed in my presence. The recorded information has been reviewed and considered for accuracy and completeness, addended by me as needed, and agree with information above.  Signed,   Meredith Staggers, MD Primary Care at [facility].  08/06/17 6:48 PM

## 2017-08-25 DIAGNOSIS — F1120 Opioid dependence, uncomplicated: Secondary | ICD-10-CM | POA: Diagnosis not present

## 2017-08-25 DIAGNOSIS — Z6822 Body mass index (BMI) 22.0-22.9, adult: Secondary | ICD-10-CM | POA: Diagnosis not present

## 2017-08-25 DIAGNOSIS — M1990 Unspecified osteoarthritis, unspecified site: Secondary | ICD-10-CM | POA: Diagnosis not present

## 2017-08-25 DIAGNOSIS — F19980 Other psychoactive substance use, unspecified with psychoactive substance-induced anxiety disorder: Secondary | ICD-10-CM | POA: Diagnosis not present

## 2017-08-25 DIAGNOSIS — F988 Other specified behavioral and emotional disorders with onset usually occurring in childhood and adolescence: Secondary | ICD-10-CM | POA: Diagnosis not present

## 2017-08-25 DIAGNOSIS — F1920 Other psychoactive substance dependence, uncomplicated: Secondary | ICD-10-CM | POA: Diagnosis not present

## 2017-08-25 MED FILL — SUBOXONE 8 MG-2 MG SL FILM: 8-2 | 28 days supply | Qty: 42 | Fill #0

## 2017-09-23 DIAGNOSIS — F988 Other specified behavioral and emotional disorders with onset usually occurring in childhood and adolescence: Secondary | ICD-10-CM | POA: Diagnosis not present

## 2017-09-23 DIAGNOSIS — F1920 Other psychoactive substance dependence, uncomplicated: Secondary | ICD-10-CM | POA: Diagnosis not present

## 2017-09-23 DIAGNOSIS — F19980 Other psychoactive substance use, unspecified with psychoactive substance-induced anxiety disorder: Secondary | ICD-10-CM | POA: Diagnosis not present

## 2017-09-23 DIAGNOSIS — M1990 Unspecified osteoarthritis, unspecified site: Secondary | ICD-10-CM | POA: Diagnosis not present

## 2017-09-23 DIAGNOSIS — Z6822 Body mass index (BMI) 22.0-22.9, adult: Secondary | ICD-10-CM | POA: Diagnosis not present

## 2017-09-23 DIAGNOSIS — F1120 Opioid dependence, uncomplicated: Secondary | ICD-10-CM | POA: Diagnosis not present

## 2017-09-23 MED FILL — SUBOXONE 8 MG-2 MG SL FILM: 8-2 | 28 days supply | Qty: 42 | Fill #0

## 2017-10-21 DIAGNOSIS — F1120 Opioid dependence, uncomplicated: Secondary | ICD-10-CM | POA: Diagnosis not present

## 2017-10-21 DIAGNOSIS — M1990 Unspecified osteoarthritis, unspecified site: Secondary | ICD-10-CM | POA: Diagnosis not present

## 2017-10-21 DIAGNOSIS — Z6822 Body mass index (BMI) 22.0-22.9, adult: Secondary | ICD-10-CM | POA: Diagnosis not present

## 2017-10-21 DIAGNOSIS — F19980 Other psychoactive substance use, unspecified with psychoactive substance-induced anxiety disorder: Secondary | ICD-10-CM | POA: Diagnosis not present

## 2017-10-21 DIAGNOSIS — F988 Other specified behavioral and emotional disorders with onset usually occurring in childhood and adolescence: Secondary | ICD-10-CM | POA: Diagnosis not present

## 2017-10-21 DIAGNOSIS — F1920 Other psychoactive substance dependence, uncomplicated: Secondary | ICD-10-CM | POA: Diagnosis not present

## 2017-10-21 MED FILL — SUBOXONE 8 MG-2 MG SL FILM: 8-2 | 28 days supply | Qty: 42 | Fill #0

## 2017-11-18 DIAGNOSIS — Z6823 Body mass index (BMI) 23.0-23.9, adult: Secondary | ICD-10-CM | POA: Diagnosis not present

## 2017-11-18 DIAGNOSIS — F1920 Other psychoactive substance dependence, uncomplicated: Secondary | ICD-10-CM | POA: Diagnosis not present

## 2017-11-18 DIAGNOSIS — F19980 Other psychoactive substance use, unspecified with psychoactive substance-induced anxiety disorder: Secondary | ICD-10-CM | POA: Diagnosis not present

## 2017-11-18 DIAGNOSIS — F1120 Opioid dependence, uncomplicated: Secondary | ICD-10-CM | POA: Diagnosis not present

## 2017-11-18 DIAGNOSIS — M1990 Unspecified osteoarthritis, unspecified site: Secondary | ICD-10-CM | POA: Diagnosis not present

## 2017-11-18 DIAGNOSIS — F988 Other specified behavioral and emotional disorders with onset usually occurring in childhood and adolescence: Secondary | ICD-10-CM | POA: Diagnosis not present

## 2017-11-18 MED FILL — SUBOXONE 8 MG-2 MG SL FILM: 8-2 | 28 days supply | Qty: 28 | Fill #0

## 2017-12-16 DIAGNOSIS — F988 Other specified behavioral and emotional disorders with onset usually occurring in childhood and adolescence: Secondary | ICD-10-CM | POA: Diagnosis not present

## 2017-12-16 DIAGNOSIS — Z6823 Body mass index (BMI) 23.0-23.9, adult: Secondary | ICD-10-CM | POA: Diagnosis not present

## 2017-12-16 DIAGNOSIS — F1120 Opioid dependence, uncomplicated: Secondary | ICD-10-CM | POA: Diagnosis not present

## 2017-12-16 DIAGNOSIS — F19980 Other psychoactive substance use, unspecified with psychoactive substance-induced anxiety disorder: Secondary | ICD-10-CM | POA: Diagnosis not present

## 2017-12-16 DIAGNOSIS — F1920 Other psychoactive substance dependence, uncomplicated: Secondary | ICD-10-CM | POA: Diagnosis not present

## 2017-12-16 DIAGNOSIS — M1990 Unspecified osteoarthritis, unspecified site: Secondary | ICD-10-CM | POA: Diagnosis not present

## 2017-12-16 MED FILL — SUBOXONE 8 MG-2 MG SL FILM: 8-2 | 28 days supply | Qty: 28 | Fill #0

## 2018-01-13 DIAGNOSIS — Z6823 Body mass index (BMI) 23.0-23.9, adult: Secondary | ICD-10-CM | POA: Diagnosis not present

## 2018-01-13 DIAGNOSIS — M1990 Unspecified osteoarthritis, unspecified site: Secondary | ICD-10-CM | POA: Diagnosis not present

## 2018-01-13 DIAGNOSIS — F1120 Opioid dependence, uncomplicated: Secondary | ICD-10-CM | POA: Diagnosis not present

## 2018-01-13 DIAGNOSIS — F1920 Other psychoactive substance dependence, uncomplicated: Secondary | ICD-10-CM | POA: Diagnosis not present

## 2018-01-13 DIAGNOSIS — F988 Other specified behavioral and emotional disorders with onset usually occurring in childhood and adolescence: Secondary | ICD-10-CM | POA: Diagnosis not present

## 2018-01-13 DIAGNOSIS — F19980 Other psychoactive substance use, unspecified with psychoactive substance-induced anxiety disorder: Secondary | ICD-10-CM | POA: Diagnosis not present

## 2018-01-13 MED FILL — SUBOXONE 8 MG-2 MG SL FILM: 8-2 | 28 days supply | Qty: 28 | Fill #0

## 2018-02-10 DIAGNOSIS — F19980 Other psychoactive substance use, unspecified with psychoactive substance-induced anxiety disorder: Secondary | ICD-10-CM | POA: Diagnosis not present

## 2018-02-10 DIAGNOSIS — M1990 Unspecified osteoarthritis, unspecified site: Secondary | ICD-10-CM | POA: Diagnosis not present

## 2018-02-10 DIAGNOSIS — Z6823 Body mass index (BMI) 23.0-23.9, adult: Secondary | ICD-10-CM | POA: Diagnosis not present

## 2018-02-10 DIAGNOSIS — F1920 Other psychoactive substance dependence, uncomplicated: Secondary | ICD-10-CM | POA: Diagnosis not present

## 2018-02-10 DIAGNOSIS — F1120 Opioid dependence, uncomplicated: Secondary | ICD-10-CM | POA: Diagnosis not present

## 2018-02-10 DIAGNOSIS — F988 Other specified behavioral and emotional disorders with onset usually occurring in childhood and adolescence: Secondary | ICD-10-CM | POA: Diagnosis not present

## 2018-02-10 MED FILL — BUPRENORPHINE HCL-NALOXONE: 8-2 | 28 days supply | Qty: 28 | Fill #0

## 2018-03-10 DIAGNOSIS — M1990 Unspecified osteoarthritis, unspecified site: Secondary | ICD-10-CM | POA: Diagnosis not present

## 2018-03-10 DIAGNOSIS — F1920 Other psychoactive substance dependence, uncomplicated: Secondary | ICD-10-CM | POA: Diagnosis not present

## 2018-03-10 DIAGNOSIS — F1120 Opioid dependence, uncomplicated: Secondary | ICD-10-CM | POA: Diagnosis not present

## 2018-03-10 DIAGNOSIS — F988 Other specified behavioral and emotional disorders with onset usually occurring in childhood and adolescence: Secondary | ICD-10-CM | POA: Diagnosis not present

## 2018-03-10 DIAGNOSIS — Z6823 Body mass index (BMI) 23.0-23.9, adult: Secondary | ICD-10-CM | POA: Diagnosis not present

## 2018-03-10 DIAGNOSIS — F19980 Other psychoactive substance use, unspecified with psychoactive substance-induced anxiety disorder: Secondary | ICD-10-CM | POA: Diagnosis not present

## 2018-03-10 MED FILL — BUPRENORPHINE HCL-NALOXONE: 8-2 | 28 days supply | Qty: 28 | Fill #0

## 2018-04-07 DIAGNOSIS — F1920 Other psychoactive substance dependence, uncomplicated: Secondary | ICD-10-CM | POA: Diagnosis not present

## 2018-04-07 DIAGNOSIS — M1990 Unspecified osteoarthritis, unspecified site: Secondary | ICD-10-CM | POA: Diagnosis not present

## 2018-04-07 DIAGNOSIS — F988 Other specified behavioral and emotional disorders with onset usually occurring in childhood and adolescence: Secondary | ICD-10-CM | POA: Diagnosis not present

## 2018-04-07 DIAGNOSIS — Z6823 Body mass index (BMI) 23.0-23.9, adult: Secondary | ICD-10-CM | POA: Diagnosis not present

## 2018-04-07 DIAGNOSIS — F19980 Other psychoactive substance use, unspecified with psychoactive substance-induced anxiety disorder: Secondary | ICD-10-CM | POA: Diagnosis not present

## 2018-04-07 DIAGNOSIS — F1120 Opioid dependence, uncomplicated: Secondary | ICD-10-CM | POA: Diagnosis not present

## 2018-04-07 MED FILL — BUPRENORPHINE HCL-NALOXONE: 8-2 | 28 days supply | Qty: 28 | Fill #0

## 2018-04-15 ENCOUNTER — Ambulatory Visit (INDEPENDENT_AMBULATORY_CARE_PROVIDER_SITE_OTHER): Payer: 59 | Admitting: Urgent Care

## 2018-04-15 ENCOUNTER — Encounter: Payer: Self-pay | Admitting: Urgent Care

## 2018-04-15 VITALS — BP 134/74 | HR 63 | Temp 98.0°F | Resp 16 | Ht 71.0 in | Wt 166.0 lb

## 2018-04-15 DIAGNOSIS — H02843 Edema of right eye, unspecified eyelid: Secondary | ICD-10-CM

## 2018-04-15 DIAGNOSIS — L03211 Cellulitis of face: Secondary | ICD-10-CM | POA: Diagnosis not present

## 2018-04-15 DIAGNOSIS — H0289 Other specified disorders of eyelid: Secondary | ICD-10-CM

## 2018-04-15 MED ORDER — DOXYCYCLINE HYCLATE 100 MG PO CAPS: 100.0000 mg | ORAL_CAPSULE | Freq: Two times a day (BID) | ORAL | 0 refills | Status: DC

## 2018-04-15 MED FILL — DOXYCYCLINE HYCLATE 100 MG: 100 | 10 days supply | Qty: 20 | Fill #0

## 2018-04-15 NOTE — Progress Notes (Signed)
    MRN: [id number] DOB: 09/15/1986  Subjective:   Ryan Spencer is a 32 y.o. male presenting for 2-day history of worsening right eyelid swelling pain.  Patient reports that his symptoms started out with a little bump over his eyebrow and worsen after he tried to pick at it.  Today he states that he woke up with his eyes shut.  Denies eye pain, drainage of pus or bleeding, fever, nausea, vomiting, belly pain, blurred vision, photosensitivity, eye pain with eye movement, eye trauma.  He has a history of positive culture to MRSA.  Has previously tried Bactrim with failed response and has done well with doxycycline.  Ryan Spencer currently has no medications in their medication list. Also is allergic to tramadol.  Ryan Spencer  has a past medical history of Abscess, Anxiety, Arthritis, Headache, Inguinal hernia, bilateral, and MRSA (methicillin resistant staph aureus) culture positive. Also  has a past surgical history that includes No past surgeries; Inguinal hernia repair (Bilateral, 11/22/2014); and Insertion of mesh (Bilateral, 11/22/2014).  Objective:   Vitals: BP 134/74   Pulse 63   Temp 98 F (36.7 C) (Oral)   Resp 16   Ht 5\' 11"  (1.803 m)   Wt 166 lb (75.3 kg)   SpO2 100%   BMI 23.15 kg/m    Visual Acuity Screening   Right eye Left eye Both eyes  Without correction: 20/20-1 20/15-1 20/13-1  With correction:       Physical Exam  Constitutional: He is oriented to person, place, and time. He appears well-developed and well-nourished.  Eyes: Pupils are equal, round, and reactive to light. EOM are normal. Right conjunctiva is not injected. Right conjunctiva has no hemorrhage. Left conjunctiva is not injected. Left conjunctiva has no hemorrhage.    Cardiovascular: Normal rate.  Pulmonary/Chest: Effort normal.  Neurological: He is alert and oriented to person, place, and time.      Assessment and Plan :   Cellulitis of face  Pain and swelling of eyelid of right eye  Counseled  patient on differential which includes cellulitis versus preseptal cellulitis.  Patient has done very well with doxycycline in the past, reports that he had a very similar episode a while back and did well with doxycycline.  Consider using cefdinir if no improvement. Counseled on ER and return to clinic precautions.  Patient is in agreement with treatment plan.  Wallis BambergMario Pandora Mccrackin, PA-C Primary Care at [facility] [phone] 04/15/2018  3:16 PM

## 2018-04-15 NOTE — Patient Instructions (Addendum)
Go the [facility] on Sunday if you are still having symptoms or are worse. Otherwise, you can follow up with [place] on Monday.   Cellulitis, Adult Cellulitis is a skin infection. The infected area is usually red and tender. This condition occurs most often in the arms and lower legs. The infection can travel to the muscles, blood, and underlying tissue and become serious. It is very important to get treated for this condition. What are the causes? Cellulitis is caused by bacteria. The bacteria enter through a break in the skin, such as a cut, burn, insect bite, open sore, or crack. What increases the risk? This condition is more likely to occur in people who:  Have a weak defense system (immune system).  Have open wounds on the skin such as cuts, burns, bites, and scrapes. Bacteria can enter the body through these open wounds.  Are older.  Have diabetes.  Have a type of long-lasting (chronic) liver disease (cirrhosis) or kidney disease.  Use IV drugs.  What are the signs or symptoms? Symptoms of this condition include:  Redness, streaking, or spotting on the skin.  Swollen area of the skin.  Tenderness or pain when an area of the skin is touched.  Warm skin.  Fever.  Chills.  Blisters.  How is this diagnosed? This condition is diagnosed based on a medical history and physical exam. You may also have tests, including:  Blood tests.  Lab tests.  Imaging tests.  How is this treated? Treatment for this condition may include:  Medicines, such as antibiotic medicines or antihistamines.  Supportive care, such as rest and application of cold or warm cloths (cold or warm compresses) to the skin.  Hospital care, if the condition is severe.  The infection usually gets better within 1-2 days of treatment. Follow these instructions at home:  Take over-the-counter and prescription medicines only as told by your health care provider.  If you were prescribed an  antibiotic medicine, take it as told by your health care provider. Do not stop taking the antibiotic even if you start to feel better.  Drink enough fluid to keep your urine clear or pale yellow.  Do not touch or rub the infected area.  Raise (elevate) the infected area above the level of your heart while you are sitting or lying down.  Apply warm or cold compresses to the affected area as told by your health care provider.  Keep all follow-up visits as told by your health care provider. This is important. These visits let your health care provider make sure a more serious infection is not developing. Contact a health care provider if:  You have a fever.  Your symptoms do not improve within 1-2 days of starting treatment.  Your bone or joint underneath the infected area becomes painful after the skin has healed.  Your infection returns in the same area or another area.  You notice a swollen bump in the infected area.  You develop new symptoms.  You have a general ill feeling (malaise) with muscle aches and pains. Get help right away if:  Your symptoms get worse.  You feel very sleepy.  You develop vomiting or diarrhea that persists.  You notice red streaks coming from the infected area.  Your red area gets larger or turns dark in color. This information is not intended to replace advice given to you by your health care provider. Make sure you discuss any questions you have with your health care  provider. Document Released: 07/22/2005 Document Revised: 02/20/2016 Document Reviewed: 08/21/2015 Elsevier Interactive Patient Education  2018 [employer]     IF you received an x-ray today, you will receive an invoice from [facility]. Please contact [facility] at [phone] with questions or concerns regarding your invoice.   IF you received labwork today, you will receive an invoice from [facility]. Please contact [facility] at [phone] with questions  or concerns regarding your invoice.   Our billing staff will not be able to assist you with questions regarding bills from these companies.  You will be contacted with the lab results as soon as they are available. The fastest way to get your results is to activate your My Chart account. Instructions are located on the last page of this paperwork. If you have not heard from us regarding the results in 2 weeks, please contact this office.

## 2018-04-23 ENCOUNTER — Ambulatory Visit (INDEPENDENT_AMBULATORY_CARE_PROVIDER_SITE_OTHER): Payer: Self-pay | Admitting: Family Medicine

## 2018-04-23 ENCOUNTER — Encounter: Payer: Self-pay | Admitting: Family Medicine

## 2018-04-23 VITALS — BP 120/58 | HR 73 | Temp 98.6°F | Resp 16 | Wt 165.4 lb

## 2018-04-23 DIAGNOSIS — Z0000 Encounter for general adult medical examination without abnormal findings: Secondary | ICD-10-CM

## 2018-04-23 DIAGNOSIS — L0390 Cellulitis, unspecified: Secondary | ICD-10-CM

## 2018-04-23 NOTE — Progress Notes (Signed)
Ryan Spencer is a 32 y.o. male who presents today with concerns of need for an employee annual physical exam. Patient reports that they have a primary care provider who manages their care and chronic health conditions. Patient denies any acute conditions that need to be addressed today this visit. He was recently treated for right eye cellulitis that is persisting and present today despite day 8 of doxycycline.  Review of Systems  Constitutional: Negative for chills, fever and malaise/fatigue.  HENT: Negative for congestion, ear discharge, ear pain, sinus pain and sore throat.   Eyes: Negative.   Respiratory: Negative for cough, sputum production and shortness of breath.   Cardiovascular: Negative.  Negative for chest pain.  Gastrointestinal: Negative for abdominal pain, diarrhea, nausea and vomiting.  Genitourinary: Negative for dysuria, frequency, hematuria and urgency.  Musculoskeletal: Negative for myalgias.  Skin: Negative.   Neurological: Negative for headaches.  Endo/Heme/Allergies: Negative.   Psychiatric/Behavioral: Negative.     O: Vitals:   04/23/18 1152  BP: (!) 120/58  Pulse: 73  Resp: 16  Temp: 98.6 F (37 C)  SpO2: 98%     Physical Exam  Constitutional: He is oriented to person, place, and time. Vital signs are normal. He appears well-developed and well-nourished. He is active.  Non-toxic appearance. He does not have a sickly appearance.  HENT:  Head: Normocephalic.  Right Ear: Hearing, tympanic membrane, external ear and ear canal normal.  Left Ear: Hearing, tympanic membrane, external ear and ear canal normal.  Nose: Nose normal.  Mouth/Throat: Uvula is midline and oropharynx is clear and moist.  Eyes:    Fluctuant center with erythremic border- tender and warm to touch  Neck: Normal range of motion. Neck supple.  Cardiovascular: Normal rate, regular rhythm, normal heart sounds and normal pulses.  Pulmonary/Chest: Effort normal and breath sounds normal.   Abdominal: Soft. Bowel sounds are normal.  Musculoskeletal: Normal range of motion.  Lymphadenopathy:       Head (right side): No submental and no submandibular adenopathy present.       Head (left side): No submental and no submandibular adenopathy present.    He has no cervical adenopathy.  Neurological: He is alert and oriented to person, place, and time.  Psychiatric: He has a normal mood and affect. His speech is normal and behavior is normal. Cognition and memory are normal.  PHQ-9- negative  Vitals reviewed.  A: 1. Physical exam   2. Cellulitis, unspecified cellulitis site      P: Exam findings, diagnosis etiology and medication use and indications reviewed with patient. Follow- Up and discharge instructions provided. No emergent/urgent issues found on exam.  Patient verbalized understanding of information provided and agrees with plan of care (POC), all questions answered.  1. Physical exam  2. Cellulitis, unspecified cellulitis site Advised to f/u with PCP if no appointment available to return here for eval and treatment

## 2018-04-23 NOTE — Patient Instructions (Signed)

## 2018-05-05 DIAGNOSIS — Z6823 Body mass index (BMI) 23.0-23.9, adult: Secondary | ICD-10-CM | POA: Diagnosis not present

## 2018-05-05 DIAGNOSIS — F1120 Opioid dependence, uncomplicated: Secondary | ICD-10-CM | POA: Diagnosis not present

## 2018-05-05 DIAGNOSIS — F988 Other specified behavioral and emotional disorders with onset usually occurring in childhood and adolescence: Secondary | ICD-10-CM | POA: Diagnosis not present

## 2018-05-05 DIAGNOSIS — F19980 Other psychoactive substance use, unspecified with psychoactive substance-induced anxiety disorder: Secondary | ICD-10-CM | POA: Diagnosis not present

## 2018-05-05 DIAGNOSIS — M1990 Unspecified osteoarthritis, unspecified site: Secondary | ICD-10-CM | POA: Diagnosis not present

## 2018-05-05 DIAGNOSIS — F1920 Other psychoactive substance dependence, uncomplicated: Secondary | ICD-10-CM | POA: Diagnosis not present

## 2018-05-05 MED FILL — BUPRENORPHINE HCL-NALOXONE: 8-2 | 28 days supply | Qty: 28 | Fill #0

## 2018-06-02 DIAGNOSIS — F1120 Opioid dependence, uncomplicated: Secondary | ICD-10-CM | POA: Diagnosis not present

## 2018-06-02 DIAGNOSIS — F19980 Other psychoactive substance use, unspecified with psychoactive substance-induced anxiety disorder: Secondary | ICD-10-CM | POA: Diagnosis not present

## 2018-06-02 DIAGNOSIS — F1920 Other psychoactive substance dependence, uncomplicated: Secondary | ICD-10-CM | POA: Diagnosis not present

## 2018-06-02 DIAGNOSIS — F988 Other specified behavioral and emotional disorders with onset usually occurring in childhood and adolescence: Secondary | ICD-10-CM | POA: Diagnosis not present

## 2018-06-02 DIAGNOSIS — M1990 Unspecified osteoarthritis, unspecified site: Secondary | ICD-10-CM | POA: Diagnosis not present

## 2018-06-02 DIAGNOSIS — Z6823 Body mass index (BMI) 23.0-23.9, adult: Secondary | ICD-10-CM | POA: Diagnosis not present

## 2018-06-02 MED FILL — BUPRENORPHINE HCL-NALOXONE: 8-2 | 28 days supply | Qty: 28 | Fill #0

## 2018-06-30 DIAGNOSIS — Z6823 Body mass index (BMI) 23.0-23.9, adult: Secondary | ICD-10-CM | POA: Diagnosis not present

## 2018-06-30 DIAGNOSIS — M1990 Unspecified osteoarthritis, unspecified site: Secondary | ICD-10-CM | POA: Diagnosis not present

## 2018-06-30 DIAGNOSIS — F1920 Other psychoactive substance dependence, uncomplicated: Secondary | ICD-10-CM | POA: Diagnosis not present

## 2018-06-30 DIAGNOSIS — F988 Other specified behavioral and emotional disorders with onset usually occurring in childhood and adolescence: Secondary | ICD-10-CM | POA: Diagnosis not present

## 2018-06-30 DIAGNOSIS — F1120 Opioid dependence, uncomplicated: Secondary | ICD-10-CM | POA: Diagnosis not present

## 2018-06-30 DIAGNOSIS — F19980 Other psychoactive substance use, unspecified with psychoactive substance-induced anxiety disorder: Secondary | ICD-10-CM | POA: Diagnosis not present

## 2018-06-30 MED FILL — BUPRENORPHINE HCL-NALOXONE: 8-2 | 28 days supply | Qty: 28 | Fill #0

## 2018-07-28 DIAGNOSIS — M1990 Unspecified osteoarthritis, unspecified site: Secondary | ICD-10-CM | POA: Diagnosis not present

## 2018-07-28 DIAGNOSIS — F19980 Other psychoactive substance use, unspecified with psychoactive substance-induced anxiety disorder: Secondary | ICD-10-CM | POA: Diagnosis not present

## 2018-07-28 DIAGNOSIS — Z6823 Body mass index (BMI) 23.0-23.9, adult: Secondary | ICD-10-CM | POA: Diagnosis not present

## 2018-07-28 DIAGNOSIS — F1920 Other psychoactive substance dependence, uncomplicated: Secondary | ICD-10-CM | POA: Diagnosis not present

## 2018-07-28 DIAGNOSIS — F1120 Opioid dependence, uncomplicated: Secondary | ICD-10-CM | POA: Diagnosis not present

## 2018-07-28 DIAGNOSIS — F988 Other specified behavioral and emotional disorders with onset usually occurring in childhood and adolescence: Secondary | ICD-10-CM | POA: Diagnosis not present

## 2018-07-28 MED FILL — SUBOXONE 8 MG-2 MG SL FILM: 8-2 | 28 days supply | Qty: 28 | Fill #0

## 2018-08-25 DIAGNOSIS — F1120 Opioid dependence, uncomplicated: Secondary | ICD-10-CM | POA: Diagnosis not present

## 2018-08-25 DIAGNOSIS — F988 Other specified behavioral and emotional disorders with onset usually occurring in childhood and adolescence: Secondary | ICD-10-CM | POA: Diagnosis not present

## 2018-08-25 DIAGNOSIS — Z6823 Body mass index (BMI) 23.0-23.9, adult: Secondary | ICD-10-CM | POA: Diagnosis not present

## 2018-08-25 DIAGNOSIS — F1920 Other psychoactive substance dependence, uncomplicated: Secondary | ICD-10-CM | POA: Diagnosis not present

## 2018-08-25 DIAGNOSIS — M1990 Unspecified osteoarthritis, unspecified site: Secondary | ICD-10-CM | POA: Diagnosis not present

## 2018-08-25 DIAGNOSIS — F19980 Other psychoactive substance use, unspecified with psychoactive substance-induced anxiety disorder: Secondary | ICD-10-CM | POA: Diagnosis not present

## 2018-08-25 MED FILL — BUPRENORPHINE HCL-NALOXONE: 8-2 | 28 days supply | Qty: 21 | Fill #0

## 2018-09-15 DIAGNOSIS — F988 Other specified behavioral and emotional disorders with onset usually occurring in childhood and adolescence: Secondary | ICD-10-CM | POA: Diagnosis not present

## 2018-09-15 DIAGNOSIS — F1120 Opioid dependence, uncomplicated: Secondary | ICD-10-CM | POA: Diagnosis not present

## 2018-09-15 DIAGNOSIS — Z6823 Body mass index (BMI) 23.0-23.9, adult: Secondary | ICD-10-CM | POA: Diagnosis not present

## 2018-09-15 DIAGNOSIS — F1920 Other psychoactive substance dependence, uncomplicated: Secondary | ICD-10-CM | POA: Diagnosis not present

## 2018-09-15 DIAGNOSIS — F19980 Other psychoactive substance use, unspecified with psychoactive substance-induced anxiety disorder: Secondary | ICD-10-CM | POA: Diagnosis not present

## 2018-09-15 DIAGNOSIS — M1990 Unspecified osteoarthritis, unspecified site: Secondary | ICD-10-CM | POA: Diagnosis not present

## 2018-09-23 MED FILL — BUPRENORPHINE HCL-NALOXONE: 8-2 | 28 days supply | Qty: 21 | Fill #0

## 2018-10-20 DIAGNOSIS — F19980 Other psychoactive substance use, unspecified with psychoactive substance-induced anxiety disorder: Secondary | ICD-10-CM | POA: Diagnosis not present

## 2018-10-20 DIAGNOSIS — F1920 Other psychoactive substance dependence, uncomplicated: Secondary | ICD-10-CM | POA: Diagnosis not present

## 2018-10-20 DIAGNOSIS — Z6823 Body mass index (BMI) 23.0-23.9, adult: Secondary | ICD-10-CM | POA: Diagnosis not present

## 2018-10-20 DIAGNOSIS — F1120 Opioid dependence, uncomplicated: Secondary | ICD-10-CM | POA: Diagnosis not present

## 2018-10-20 DIAGNOSIS — F988 Other specified behavioral and emotional disorders with onset usually occurring in childhood and adolescence: Secondary | ICD-10-CM | POA: Diagnosis not present

## 2018-10-20 DIAGNOSIS — M1990 Unspecified osteoarthritis, unspecified site: Secondary | ICD-10-CM | POA: Diagnosis not present

## 2018-11-17 DIAGNOSIS — F19980 Other psychoactive substance use, unspecified with psychoactive substance-induced anxiety disorder: Secondary | ICD-10-CM | POA: Diagnosis not present

## 2018-11-17 DIAGNOSIS — M1990 Unspecified osteoarthritis, unspecified site: Secondary | ICD-10-CM | POA: Diagnosis not present

## 2018-11-17 DIAGNOSIS — Z6823 Body mass index (BMI) 23.0-23.9, adult: Secondary | ICD-10-CM | POA: Diagnosis not present

## 2018-11-17 DIAGNOSIS — F988 Other specified behavioral and emotional disorders with onset usually occurring in childhood and adolescence: Secondary | ICD-10-CM | POA: Diagnosis not present

## 2018-11-17 DIAGNOSIS — F1120 Opioid dependence, uncomplicated: Secondary | ICD-10-CM | POA: Diagnosis not present

## 2018-11-17 DIAGNOSIS — F1920 Other psychoactive substance dependence, uncomplicated: Secondary | ICD-10-CM | POA: Diagnosis not present

## 2018-11-17 MED FILL — BUPRENORPHINE HCL-NALOXONE: 8-2 | 84 days supply | Qty: 21 | Fill #0

## 2018-12-15 DIAGNOSIS — F1920 Other psychoactive substance dependence, uncomplicated: Secondary | ICD-10-CM | POA: Diagnosis not present

## 2018-12-15 DIAGNOSIS — Z6822 Body mass index (BMI) 22.0-22.9, adult: Secondary | ICD-10-CM | POA: Diagnosis not present

## 2018-12-15 DIAGNOSIS — F19980 Other psychoactive substance use, unspecified with psychoactive substance-induced anxiety disorder: Secondary | ICD-10-CM | POA: Diagnosis not present

## 2018-12-15 DIAGNOSIS — F1120 Opioid dependence, uncomplicated: Secondary | ICD-10-CM | POA: Diagnosis not present

## 2018-12-15 DIAGNOSIS — F988 Other specified behavioral and emotional disorders with onset usually occurring in childhood and adolescence: Secondary | ICD-10-CM | POA: Diagnosis not present

## 2018-12-15 DIAGNOSIS — M1990 Unspecified osteoarthritis, unspecified site: Secondary | ICD-10-CM | POA: Diagnosis not present

## 2019-01-12 DIAGNOSIS — F988 Other specified behavioral and emotional disorders with onset usually occurring in childhood and adolescence: Secondary | ICD-10-CM | POA: Diagnosis not present

## 2019-01-12 DIAGNOSIS — Z6822 Body mass index (BMI) 22.0-22.9, adult: Secondary | ICD-10-CM | POA: Diagnosis not present

## 2019-01-12 DIAGNOSIS — F19980 Other psychoactive substance use, unspecified with psychoactive substance-induced anxiety disorder: Secondary | ICD-10-CM | POA: Diagnosis not present

## 2019-01-12 DIAGNOSIS — F1120 Opioid dependence, uncomplicated: Secondary | ICD-10-CM | POA: Diagnosis not present

## 2019-01-12 DIAGNOSIS — F1920 Other psychoactive substance dependence, uncomplicated: Secondary | ICD-10-CM | POA: Diagnosis not present

## 2019-01-12 DIAGNOSIS — M1990 Unspecified osteoarthritis, unspecified site: Secondary | ICD-10-CM | POA: Diagnosis not present

## 2019-01-12 MED FILL — BUPRENORPHINE HCL-NALOXONE: 8-2 | 28 days supply | Qty: 21 | Fill #0

## 2019-02-09 DIAGNOSIS — F988 Other specified behavioral and emotional disorders with onset usually occurring in childhood and adolescence: Secondary | ICD-10-CM | POA: Diagnosis not present

## 2019-02-09 DIAGNOSIS — Z6822 Body mass index (BMI) 22.0-22.9, adult: Secondary | ICD-10-CM | POA: Diagnosis not present

## 2019-02-09 DIAGNOSIS — F1920 Other psychoactive substance dependence, uncomplicated: Secondary | ICD-10-CM | POA: Diagnosis not present

## 2019-02-09 DIAGNOSIS — M1990 Unspecified osteoarthritis, unspecified site: Secondary | ICD-10-CM | POA: Diagnosis not present

## 2019-02-09 DIAGNOSIS — F19980 Other psychoactive substance use, unspecified with psychoactive substance-induced anxiety disorder: Secondary | ICD-10-CM | POA: Diagnosis not present

## 2019-02-09 DIAGNOSIS — F1120 Opioid dependence, uncomplicated: Secondary | ICD-10-CM | POA: Diagnosis not present

## 2019-02-09 MED FILL — BUPRENORPHINE HCL-NALOXONE: 8-2 | 28 days supply | Qty: 21 | Fill #0

## 2019-03-09 DIAGNOSIS — F1120 Opioid dependence, uncomplicated: Secondary | ICD-10-CM | POA: Diagnosis not present

## 2019-03-09 DIAGNOSIS — F988 Other specified behavioral and emotional disorders with onset usually occurring in childhood and adolescence: Secondary | ICD-10-CM | POA: Diagnosis not present

## 2019-03-09 DIAGNOSIS — F1920 Other psychoactive substance dependence, uncomplicated: Secondary | ICD-10-CM | POA: Diagnosis not present

## 2019-03-09 DIAGNOSIS — Z6825 Body mass index (BMI) 25.0-25.9, adult: Secondary | ICD-10-CM | POA: Diagnosis not present

## 2019-03-09 DIAGNOSIS — F19980 Other psychoactive substance use, unspecified with psychoactive substance-induced anxiety disorder: Secondary | ICD-10-CM | POA: Diagnosis not present

## 2019-03-09 DIAGNOSIS — M1990 Unspecified osteoarthritis, unspecified site: Secondary | ICD-10-CM | POA: Diagnosis not present

## 2019-03-09 MED FILL — BUPRENORPHINE HCL-NALOXONE: 8-2 | 28 days supply | Qty: 21 | Fill #0

## 2019-04-06 DIAGNOSIS — F988 Other specified behavioral and emotional disorders with onset usually occurring in childhood and adolescence: Secondary | ICD-10-CM | POA: Diagnosis not present

## 2019-04-06 DIAGNOSIS — M1990 Unspecified osteoarthritis, unspecified site: Secondary | ICD-10-CM | POA: Diagnosis not present

## 2019-04-06 DIAGNOSIS — Z6825 Body mass index (BMI) 25.0-25.9, adult: Secondary | ICD-10-CM | POA: Diagnosis not present

## 2019-04-06 DIAGNOSIS — F19980 Other psychoactive substance use, unspecified with psychoactive substance-induced anxiety disorder: Secondary | ICD-10-CM | POA: Diagnosis not present

## 2019-04-06 DIAGNOSIS — F1120 Opioid dependence, uncomplicated: Secondary | ICD-10-CM | POA: Diagnosis not present

## 2019-04-06 DIAGNOSIS — F1920 Other psychoactive substance dependence, uncomplicated: Secondary | ICD-10-CM | POA: Diagnosis not present

## 2019-04-06 MED FILL — BUPRENORPHINE HCL-NALOXONE: 8-2 | 28 days supply | Qty: 21 | Fill #0

## 2019-04-25 ENCOUNTER — Other Ambulatory Visit (INDEPENDENT_AMBULATORY_CARE_PROVIDER_SITE_OTHER): Payer: 59

## 2019-04-25 ENCOUNTER — Ambulatory Visit (INDEPENDENT_AMBULATORY_CARE_PROVIDER_SITE_OTHER): Payer: 59 | Admitting: Internal Medicine

## 2019-04-25 ENCOUNTER — Encounter: Payer: Self-pay | Admitting: Internal Medicine

## 2019-04-25 ENCOUNTER — Other Ambulatory Visit: Payer: Self-pay

## 2019-04-25 VITALS — BP 120/62 | HR 66 | Temp 98.0°F | Ht 71.0 in | Wt 160.0 lb

## 2019-04-25 DIAGNOSIS — Z0000 Encounter for general adult medical examination without abnormal findings: Secondary | ICD-10-CM

## 2019-04-25 DIAGNOSIS — R5382 Chronic fatigue, unspecified: Secondary | ICD-10-CM | POA: Diagnosis not present

## 2019-04-25 LAB — COMPREHENSIVE METABOLIC PANEL
ALT: 9 U/L (ref 0–53)
AST: 16 U/L (ref 0–37)
Albumin: 4.8 g/dL (ref 3.5–5.2)
Alkaline Phosphatase: 67 U/L (ref 39–117)
BUN: 10 mg/dL (ref 6–23)
CO2: 29 mEq/L (ref 19–32)
Calcium: 9.7 mg/dL (ref 8.4–10.5)
Chloride: 104 mEq/L (ref 96–112)
Creatinine, Ser: 0.86 mg/dL (ref 0.40–1.50)
GFR: 101.45 mL/min (ref >60.00)
Glucose, Bld: 77 mg/dL (ref 70–99)
Potassium: 3.7 mEq/L (ref 3.5–5.1)
Sodium: 140 mEq/L (ref 135–145)
Total Bilirubin: 0.4 mg/dL (ref 0.2–1.2)
Total Protein: 7 g/dL (ref 6.0–8.3)

## 2019-04-25 LAB — CBC WITH DIFFERENTIAL/PLATELET
Basophils Absolute: 0 10*3/uL (ref 0.0–0.1)
Basophils Relative: 1 % (ref 0.0–3.0)
Eosinophils Absolute: 0.5 10*3/uL (ref 0.0–0.7)
Eosinophils Relative: 8.9 % — ABNORMAL HIGH (ref 0.0–5.0)
HCT: 40.8 % (ref 39.0–52.0)
Hemoglobin: 13.7 g/dL (ref 13.0–17.0)
Lymphocytes Relative: 44.6 % (ref 12.0–46.0)
Lymphs Abs: 2.3 10*3/uL (ref 0.7–4.0)
MCHC: 33.7 g/dL (ref 30.0–36.0)
MCV: 91 fl (ref 78.0–100.0)
Monocytes Absolute: 0.4 10*3/uL (ref 0.1–1.0)
Monocytes Relative: 8.7 % (ref 3.0–12.0)
Neutro Abs: 1.9 10*3/uL (ref 1.4–7.7)
Neutrophils Relative %: 36.8 % — ABNORMAL LOW (ref 43.0–77.0)
Platelets: 217 10*3/uL (ref 150.0–400.0)
RBC: 4.48 Mil/uL (ref 4.22–5.81)
RDW: 13.3 % (ref 11.5–15.5)
WBC: 5.1 10*3/uL (ref 4.0–10.5)

## 2019-04-25 LAB — LIPID PANEL
Cholesterol: 164 mg/dL (ref 0–200)
HDL: 59 mg/dL (ref >39.00)
LDL Cholesterol: 90 mg/dL (ref 0–99)
NonHDL: 105.14
Total CHOL/HDL Ratio: 3
Triglycerides: 77 mg/dL (ref 0.0–149.0)
VLDL: 15.4 mg/dL (ref 0.0–40.0)

## 2019-04-25 LAB — TSH: TSH: 1.22 u[IU]/mL (ref 0.35–4.50)

## 2019-04-25 NOTE — Patient Instructions (Signed)

## 2019-04-25 NOTE — Progress Notes (Signed)
Subjective:  Patient ID: Ryan Spencer, male    DOB: 03/25/1986  Age: 33 y.o. MRN: [id number]  CC: Annual Exam  NEW TO ME  HPI [name] presents for a CPX.  He complains of chronic, intermittent fatigue for several years.  He otherwise feels well and offers no other complaints.   History Brees has a past medical history of Abscess, Anxiety, Arthritis, Headache, Inguinal hernia, bilateral, and MRSA (methicillin resistant staph aureus) culture positive.   He has a past surgical history that includes No past surgeries; Inguinal hernia repair (Bilateral, 11/22/2014); and Insertion of mesh (Bilateral, 11/22/2014).   His family history includes Arthritis in his mother; Cancer in his maternal grandmother, paternal grandfather, and paternal grandmother; Diabetes in his paternal grandmother.He reports that he has been smoking e-cigarettes. He has a 7.00 pack-year smoking history. He has never used smokeless tobacco. He reports current alcohol use of about 5.0 standard drinks of alcohol per week. He reports that he does not use drugs.  Outpatient Medications Prior to Visit  Medication Sig Dispense Refill  . Buprenorphine HCl-Naloxone HCl 8-2 MG FILM     . doxycycline (VIBRAMYCIN) 100 MG capsule Take 1 capsule (100 mg total) by mouth 2 (two) times daily. 20 capsule 0   No facility-administered medications prior to visit.     ROS Review of Systems  Constitutional: Positive for fatigue. Negative for appetite change, chills, diaphoresis and unexpected weight change.  HENT: Negative.   Eyes: Negative for visual disturbance.  Respiratory: Negative for cough and shortness of breath.   Cardiovascular: Negative for chest pain and palpitations.  Gastrointestinal: Negative for abdominal pain, constipation, diarrhea, nausea and vomiting.  Endocrine: Negative.  Negative for cold intolerance and heat intolerance.  Genitourinary: Negative.  Negative for difficulty urinating, scrotal  swelling, testicular pain and urgency.  Musculoskeletal: Negative.  Negative for myalgias.  Skin: Negative.  Negative for rash.  Neurological: Negative.  Negative for dizziness, weakness, light-headedness and headaches.  Hematological: Negative for adenopathy. Does not bruise/bleed easily.  Psychiatric/Behavioral: Negative.  Negative for sleep disturbance. The patient is not nervous/anxious.     Objective:  BP 120/62 (BP Location: Left Arm, Patient Position: Sitting, Cuff Size: Normal)   Pulse 66   Temp 98 F (36.7 C) (Oral)   Ht 5\' 11"  (1.803 m)   Wt 160 lb (72.6 kg)   SpO2 98%   BMI 22.32 kg/m   Physical Exam Vitals signs reviewed.  Constitutional:      Appearance: He is not ill-appearing or diaphoretic.  HENT:     Nose: Nose normal. No congestion or rhinorrhea.     Mouth/Throat:     Mouth: Mucous membranes are moist.  Eyes:     General: No scleral icterus.    Conjunctiva/sclera: Conjunctivae normal.  Neck:     Musculoskeletal: Normal range of motion. No neck rigidity.  Cardiovascular:     Rate and Rhythm: Normal rate and regular rhythm.     Heart sounds: No murmur. No gallop.   Pulmonary:     Effort: Pulmonary effort is normal.     Breath sounds: No stridor. No wheezing, rhonchi or rales.  Abdominal:     General: Abdomen is flat. Bowel sounds are normal.     Palpations: There is no hepatomegaly, splenomegaly or mass.     Tenderness: There is no abdominal tenderness. There is no guarding.     Hernia: No hernia is present.  Musculoskeletal: Normal range of motion.     Right  lower leg: No edema.     Left lower leg: No edema.  Lymphadenopathy:     Cervical: No cervical adenopathy.  Skin:    General: Skin is warm and dry.     Coloration: Skin is not pale.  Neurological:     General: No focal deficit present.     Mental Status: He is oriented to person, place, and time.  Psychiatric:        Mood and Affect: Mood normal.        Behavior: Behavior normal.       Lab Results  Component Value Date   WBC 5.1 04/25/2019   HGB 13.7 04/25/2019   HCT 40.8 04/25/2019   PLT 217.0 04/25/2019   GLUCOSE 77 04/25/2019   CHOL 164 04/25/2019   TRIG 77.0 04/25/2019   HDL 59.00 04/25/2019   LDLCALC 90 04/25/2019   ALT 9 04/25/2019   AST 16 04/25/2019   NA 140 04/25/2019   K 3.7 04/25/2019   CL 104 04/25/2019   CREATININE 0.86 04/25/2019   BUN 10 04/25/2019   CO2 29 04/25/2019   TSH 1.22 04/25/2019     Assessment & Plan:   Casimiro NeedleMichael was seen today for annual exam.  Diagnoses and all orders for this visit:  Routine general medical examination at a health care facility-exam completed, labs reviewed, vaccines reviewed, patient education material was given. -     Lipid panel; Future -     HIV Antibody (routine testing w rflx); Future  Chronic fatigue - Based on the lack of any other symptoms, normal exam, and normal labs I think there is a benign nature to the fatigue.  He will let me know if he develops any new or worsening symptoms. -     CBC with Differential/Platelet; Future -     Comprehensive metabolic panel; Future -     TSH; Future   I have discontinued Despina AriasMichael V. Duhe's doxycycline. I am also having him maintain his Buprenorphine HCl-Naloxone HCl.  No orders of the defined types were placed in this encounter.    Follow-up: Return if symptoms worsen or fail to improve.  Sanda Lingerhomas Joeangel Jeanpaul, MD

## 2019-04-26 ENCOUNTER — Encounter: Payer: Self-pay | Admitting: Internal Medicine

## 2019-04-26 LAB — HIV ANTIBODY (ROUTINE TESTING W REFLEX): HIV 1&2 Ab, 4th Generation: NONREACTIVE

## 2019-05-08 DIAGNOSIS — F19980 Other psychoactive substance use, unspecified with psychoactive substance-induced anxiety disorder: Secondary | ICD-10-CM | POA: Diagnosis not present

## 2019-05-08 DIAGNOSIS — M1990 Unspecified osteoarthritis, unspecified site: Secondary | ICD-10-CM | POA: Diagnosis not present

## 2019-05-08 DIAGNOSIS — F1120 Opioid dependence, uncomplicated: Secondary | ICD-10-CM | POA: Diagnosis not present

## 2019-05-08 DIAGNOSIS — Z6822 Body mass index (BMI) 22.0-22.9, adult: Secondary | ICD-10-CM | POA: Diagnosis not present

## 2019-05-08 DIAGNOSIS — F988 Other specified behavioral and emotional disorders with onset usually occurring in childhood and adolescence: Secondary | ICD-10-CM | POA: Diagnosis not present

## 2019-05-08 DIAGNOSIS — F1920 Other psychoactive substance dependence, uncomplicated: Secondary | ICD-10-CM | POA: Diagnosis not present

## 2019-05-08 MED FILL — BUPRENORPHINE HCL-NALOXONE: 8-2 | 28 days supply | Qty: 21 | Fill #0

## 2019-06-05 DIAGNOSIS — M1990 Unspecified osteoarthritis, unspecified site: Secondary | ICD-10-CM | POA: Diagnosis not present

## 2019-06-05 DIAGNOSIS — F19980 Other psychoactive substance use, unspecified with psychoactive substance-induced anxiety disorder: Secondary | ICD-10-CM | POA: Diagnosis not present

## 2019-06-05 DIAGNOSIS — F988 Other specified behavioral and emotional disorders with onset usually occurring in childhood and adolescence: Secondary | ICD-10-CM | POA: Diagnosis not present

## 2019-06-05 DIAGNOSIS — F1920 Other psychoactive substance dependence, uncomplicated: Secondary | ICD-10-CM | POA: Diagnosis not present

## 2019-06-05 DIAGNOSIS — Z6822 Body mass index (BMI) 22.0-22.9, adult: Secondary | ICD-10-CM | POA: Diagnosis not present

## 2019-06-05 DIAGNOSIS — F1120 Opioid dependence, uncomplicated: Secondary | ICD-10-CM | POA: Diagnosis not present

## 2019-06-05 MED FILL — BUPRENORPHINE HCL-NALOXONE: 8-2 | 28 days supply | Qty: 21 | Fill #0

## 2019-06-30 DIAGNOSIS — F19980 Other psychoactive substance use, unspecified with psychoactive substance-induced anxiety disorder: Secondary | ICD-10-CM | POA: Diagnosis not present

## 2019-06-30 DIAGNOSIS — Z6822 Body mass index (BMI) 22.0-22.9, adult: Secondary | ICD-10-CM | POA: Diagnosis not present

## 2019-06-30 DIAGNOSIS — M1990 Unspecified osteoarthritis, unspecified site: Secondary | ICD-10-CM | POA: Diagnosis not present

## 2019-06-30 DIAGNOSIS — F1920 Other psychoactive substance dependence, uncomplicated: Secondary | ICD-10-CM | POA: Diagnosis not present

## 2019-06-30 DIAGNOSIS — F1120 Opioid dependence, uncomplicated: Secondary | ICD-10-CM | POA: Diagnosis not present

## 2019-06-30 DIAGNOSIS — F988 Other specified behavioral and emotional disorders with onset usually occurring in childhood and adolescence: Secondary | ICD-10-CM | POA: Diagnosis not present

## 2019-06-30 MED FILL — BUPRENORPHINE HCL-NALOXONE: 8-2 | 28 days supply | Qty: 21 | Fill #0

## 2019-07-28 DIAGNOSIS — F1920 Other psychoactive substance dependence, uncomplicated: Secondary | ICD-10-CM | POA: Diagnosis not present

## 2019-07-28 DIAGNOSIS — F1120 Opioid dependence, uncomplicated: Secondary | ICD-10-CM | POA: Diagnosis not present

## 2019-07-28 DIAGNOSIS — F19980 Other psychoactive substance use, unspecified with psychoactive substance-induced anxiety disorder: Secondary | ICD-10-CM | POA: Diagnosis not present

## 2019-07-28 DIAGNOSIS — F988 Other specified behavioral and emotional disorders with onset usually occurring in childhood and adolescence: Secondary | ICD-10-CM | POA: Diagnosis not present

## 2019-07-28 DIAGNOSIS — Z6822 Body mass index (BMI) 22.0-22.9, adult: Secondary | ICD-10-CM | POA: Diagnosis not present

## 2019-07-28 DIAGNOSIS — M1990 Unspecified osteoarthritis, unspecified site: Secondary | ICD-10-CM | POA: Diagnosis not present

## 2019-07-28 MED FILL — BUPRENORPHINE HCL-NALOXONE: 8-2 | 28 days supply | Qty: 21 | Fill #0

## 2019-08-21 ENCOUNTER — Ambulatory Visit: Payer: 59 | Admitting: Internal Medicine

## 2019-08-25 DIAGNOSIS — F1120 Opioid dependence, uncomplicated: Secondary | ICD-10-CM | POA: Diagnosis not present

## 2019-08-25 DIAGNOSIS — M1990 Unspecified osteoarthritis, unspecified site: Secondary | ICD-10-CM | POA: Diagnosis not present

## 2019-08-25 DIAGNOSIS — Z6822 Body mass index (BMI) 22.0-22.9, adult: Secondary | ICD-10-CM | POA: Diagnosis not present

## 2019-08-25 DIAGNOSIS — F1920 Other psychoactive substance dependence, uncomplicated: Secondary | ICD-10-CM | POA: Diagnosis not present

## 2019-08-25 DIAGNOSIS — F988 Other specified behavioral and emotional disorders with onset usually occurring in childhood and adolescence: Secondary | ICD-10-CM | POA: Diagnosis not present

## 2019-08-25 DIAGNOSIS — F19980 Other psychoactive substance use, unspecified with psychoactive substance-induced anxiety disorder: Secondary | ICD-10-CM | POA: Diagnosis not present

## 2019-08-25 MED FILL — BUPRENORPHINE HCL-NALOXONE: 8-2 | 28 days supply | Qty: 21 | Fill #0

## 2019-08-28 ENCOUNTER — Encounter: Payer: Self-pay | Admitting: Internal Medicine

## 2019-08-28 ENCOUNTER — Other Ambulatory Visit (INDEPENDENT_AMBULATORY_CARE_PROVIDER_SITE_OTHER): Payer: 59

## 2019-08-28 ENCOUNTER — Ambulatory Visit (INDEPENDENT_AMBULATORY_CARE_PROVIDER_SITE_OTHER): Payer: 59 | Admitting: Internal Medicine

## 2019-08-28 ENCOUNTER — Other Ambulatory Visit: Payer: Self-pay

## 2019-08-28 VITALS — BP 116/64 | HR 89 | Temp 98.2°F | Resp 16 | Ht 71.0 in | Wt 162.0 lb

## 2019-08-28 DIAGNOSIS — R42 Dizziness and giddiness: Secondary | ICD-10-CM | POA: Insufficient documentation

## 2019-08-28 DIAGNOSIS — R200 Anesthesia of skin: Secondary | ICD-10-CM

## 2019-08-28 DIAGNOSIS — R002 Palpitations: Secondary | ICD-10-CM | POA: Insufficient documentation

## 2019-08-28 LAB — CBC WITH DIFFERENTIAL/PLATELET
Basophils Absolute: 0 10*3/uL (ref 0.0–0.1)
Basophils Relative: 0.8 % (ref 0.0–3.0)
Eosinophils Absolute: 0.5 10*3/uL (ref 0.0–0.7)
Eosinophils Relative: 9 % — ABNORMAL HIGH (ref 0.0–5.0)
HCT: 41.5 % (ref 39.0–52.0)
Hemoglobin: 13.9 g/dL (ref 13.0–17.0)
Lymphocytes Relative: 52.2 % — ABNORMAL HIGH (ref 12.0–46.0)
Lymphs Abs: 2.8 10*3/uL (ref 0.7–4.0)
MCHC: 33.5 g/dL (ref 30.0–36.0)
MCV: 90.6 fl (ref 78.0–100.0)
Monocytes Absolute: 0.4 10*3/uL (ref 0.1–1.0)
Monocytes Relative: 7.5 % (ref 3.0–12.0)
Neutro Abs: 1.6 10*3/uL (ref 1.4–7.7)
Neutrophils Relative %: 30.5 % — ABNORMAL LOW (ref 43.0–77.0)
Platelets: 239 10*3/uL (ref 150.0–400.0)
RBC: 4.58 Mil/uL (ref 4.22–5.81)
RDW: 13 % (ref 11.5–15.5)
WBC: 5.4 10*3/uL (ref 4.0–10.5)

## 2019-08-28 LAB — BASIC METABOLIC PANEL
BUN: 13 mg/dL (ref 6–23)
CO2: 28 mEq/L (ref 19–32)
Calcium: 9.9 mg/dL (ref 8.4–10.5)
Chloride: 104 mEq/L (ref 96–112)
Creatinine, Ser: 0.91 mg/dL (ref 0.40–1.50)
GFR: 94.86 mL/min (ref >60.00)
Glucose, Bld: 109 mg/dL — ABNORMAL HIGH (ref 70–99)
Potassium: 4.3 mEq/L (ref 3.5–5.1)
Sodium: 141 mEq/L (ref 135–145)

## 2019-08-28 LAB — TSH: TSH: 1.97 u[IU]/mL (ref 0.35–4.50)

## 2019-08-28 LAB — FOLATE: Folate: 11.1 ng/mL (ref >5.9)

## 2019-08-28 LAB — VITAMIN B12: Vitamin B-12: 436 pg/mL (ref 211–911)

## 2019-08-28 NOTE — Progress Notes (Signed)
Subjective:  Patient ID: Ryan Spencer, male    DOB: Feb 06, 1986  Age: 33 y.o. MRN: [id number]  CC: Palpitations   HPI PADEN SENGER presents for f/up - He complains of a several week history of palpitations that occur at rest.  He describes a sensation that his heart rate is fast but not irregular.  He has also had intermittent episodes of dizziness, lightheadedness, and near syncope.  He tells me that all of his symptoms improve with activity.  He also complains of numbness and tingling in his fingers.  Outpatient Medications Prior to Visit  Medication Sig Dispense Refill  . Buprenorphine HCl-Naloxone HCl 8-2 MG FILM      No facility-administered medications prior to visit.     ROS Review of Systems  Constitutional: Negative.  Negative for diaphoresis and fatigue.  HENT: Negative.   Eyes: Negative for photophobia, pain and visual disturbance.  Respiratory: Negative for cough, chest tightness, shortness of breath and wheezing.   Cardiovascular: Positive for palpitations. Negative for chest pain and leg swelling.  Gastrointestinal: Negative for abdominal pain, diarrhea, nausea and vomiting.  Endocrine: Negative.   Genitourinary: Negative.  Negative for difficulty urinating.  Musculoskeletal: Negative.  Negative for arthralgias, back pain and neck pain.  Skin: Negative.  Negative for color change and rash.  Neurological: Positive for dizziness, light-headedness and numbness. Negative for tremors, seizures, syncope, speech difficulty, weakness and headaches.       He complains of numbness and tingling symmetrically in both hands and fingers.  Hematological: Negative for adenopathy. Does not bruise/bleed easily.  Psychiatric/Behavioral: Negative for decreased concentration, dysphoric mood and sleep disturbance. The patient is not nervous/anxious.     Objective:  BP 116/64 (BP Location: Left Arm, Patient Position: Sitting, Cuff Size: Large)   Pulse 89   Temp 98.2 F (36.8 C)  (Oral)   Resp 16   Ht 5\' 11"  (1.803 m)   Wt 162 lb (73.5 kg)   SpO2 99%   BMI 22.59 kg/m   BP Readings from Last 3 Encounters:  08/28/19 116/64  04/25/19 120/62  04/23/18 (!) 120/58    Wt Readings from Last 3 Encounters:  08/28/19 162 lb (73.5 kg)  04/25/19 160 lb (72.6 kg)  04/23/18 165 lb 6.4 oz (75 kg)    Physical Exam Vitals signs reviewed.  Constitutional:      Appearance: Normal appearance.  HENT:     Right Ear: Hearing and tympanic membrane normal.     Left Ear: Hearing and tympanic membrane normal.     Nose: Nose normal.     Mouth/Throat:     Mouth: Mucous membranes are moist.  Eyes:     General: No scleral icterus.    Conjunctiva/sclera: Conjunctivae normal.  Neck:     Musculoskeletal: Neck supple.  Cardiovascular:     Rate and Rhythm: Normal rate and regular rhythm.     Heart sounds: No murmur. No gallop.      Comments: EKG ---  Sinus  Rhythm  WITHIN NORMAL LIMITS  Pulmonary:     Effort: Pulmonary effort is normal.     Breath sounds: No stridor. No wheezing, rhonchi or rales.  Abdominal:     General: Abdomen is flat. Bowel sounds are normal.     Palpations: There is no hepatomegaly or splenomegaly.     Tenderness: There is no abdominal tenderness.  Musculoskeletal:     Right lower leg: No edema.     Left lower leg: No edema.  Lymphadenopathy:  Cervical: No cervical adenopathy.  Skin:    General: Skin is warm and dry.  Neurological:     General: No focal deficit present.     Mental Status: He is alert and oriented to person, place, and time. Mental status is at baseline.     Cranial Nerves: Cranial nerves are intact.     Sensory: Sensation is intact.     Motor: Motor function is intact. No weakness, tremor or abnormal muscle tone.     Coordination: Coordination is intact. Romberg sign negative. Coordination normal.     Gait: Gait is intact. Gait normal.     Deep Tendon Reflexes:     Reflex Scores:      Tricep reflexes are 1+ on the right  side.      Bicep reflexes are 1+ on the right side.      Brachioradialis reflexes are 1+ on the right side and 1+ on the left side.      Patellar reflexes are 2+ on the right side and 2+ on the left side.      Achilles reflexes are 1+ on the right side and 1+ on the left side.    Lab Results  Component Value Date   WBC 5.4 08/28/2019   HGB 13.9 08/28/2019   HCT 41.5 08/28/2019   PLT 239.0 08/28/2019   GLUCOSE 109 (H) 08/28/2019   CHOL 164 04/25/2019   TRIG 77.0 04/25/2019   HDL 59.00 04/25/2019   LDLCALC 90 04/25/2019   ALT 9 04/25/2019   AST 16 04/25/2019   NA 141 08/28/2019   K 4.3 08/28/2019   CL 104 08/28/2019   CREATININE 0.91 08/28/2019   BUN 13 08/28/2019   CO2 28 08/28/2019   TSH 1.97 08/28/2019    No results found.  Assessment & Plan:   Jary was seen today for palpitations.  Diagnoses and all orders for this visit:  Rapid palpitations- He describes rapid palpitations with other systemic symptoms.  His EKG today is normal.  Labs are negative for secondary causes.  I have asked him to wear a 48-hour Holter monitor to try to identify what his heart rate is during the episodes of palpitations. -     EKG 12-Lead -     TSH; Future -     Holter monitor - 48 hour; Future  Numbness in both hands- His lab work is negative for secondary or metabolic causes. -     CBC with Differential/Platelet; Future -     Basic metabolic panel; Future -     Vitamin B12; Future -     Folate; Future  Intermittent lightheadedness- His labs do not indicate a secondary cause.  I do not see any concern for neurological cause.  I think the best thing to do at this time is to evaluate the palpitations. -     CBC with Differential/Platelet; Future -     Basic metabolic panel; Future   I am having Monish Haliburton. Rosko maintain his Buprenorphine HCl-Naloxone HCl.  No orders of the defined types were placed in this encounter.    Follow-up: Return in about 4 weeks (around 09/25/2019).   Scarlette Calico, MD

## 2019-08-28 NOTE — Patient Instructions (Signed)

## 2019-09-08 ENCOUNTER — Telehealth: Payer: Self-pay

## 2019-09-08 NOTE — Telephone Encounter (Signed)
LM with monitor instructions. 3 day ZIO ordered.  

## 2019-09-20 DIAGNOSIS — M1990 Unspecified osteoarthritis, unspecified site: Secondary | ICD-10-CM | POA: Diagnosis not present

## 2019-09-20 DIAGNOSIS — F988 Other specified behavioral and emotional disorders with onset usually occurring in childhood and adolescence: Secondary | ICD-10-CM | POA: Diagnosis not present

## 2019-09-20 DIAGNOSIS — F19980 Other psychoactive substance use, unspecified with psychoactive substance-induced anxiety disorder: Secondary | ICD-10-CM | POA: Diagnosis not present

## 2019-09-20 DIAGNOSIS — F1120 Opioid dependence, uncomplicated: Secondary | ICD-10-CM | POA: Diagnosis not present

## 2019-09-20 DIAGNOSIS — Z6822 Body mass index (BMI) 22.0-22.9, adult: Secondary | ICD-10-CM | POA: Diagnosis not present

## 2019-09-20 DIAGNOSIS — F1920 Other psychoactive substance dependence, uncomplicated: Secondary | ICD-10-CM | POA: Diagnosis not present

## 2019-09-20 MED FILL — BUPRENORPHINE HCL-NALOXONE: 8-2 | 28 days supply | Qty: 21 | Fill #0

## 2019-09-28 ENCOUNTER — Telehealth: Payer: Self-pay | Admitting: *Deleted

## 2019-09-28 NOTE — Telephone Encounter (Signed)
Forwarded to [employer]

## 2019-09-28 NOTE — Telephone Encounter (Signed)
Ryan Spencer called and was letting us know that the pt has not gotten a phone call regarding holter monitor.  Please call to discuss.

## 2019-10-02 ENCOUNTER — Telehealth: Payer: Self-pay | Admitting: *Deleted

## 2019-10-02 NOTE — Telephone Encounter (Signed)
Patient called regarding holter monitor.  Monitor had been delivered to [address] [place], [place] [address] on 09/14/19 at 224 PM per USPS tracking. Patient had been called and LMVM on 09/08/19 by April Louretta Shorten, [facility],  regarding monitor being mailed and instructions. Patient states they have moved and he thought he updated his information with his PCP. Address updated in Epic.  Patient re-enrolled for monitor to be mailed to new address.

## 2019-10-02 NOTE — Telephone Encounter (Signed)
According to chart, patient was called 09/08/2019 by April Garrison, who [facility] regarding monitor and instructions. [employer] was called.  Monitor was delivered 09/14/19 224 PM at [place] per USPS tracking.  Monitor has not yet been returned to [facility].  LM with Byron.   Will call patient.

## 2019-10-08 ENCOUNTER — Ambulatory Visit (INDEPENDENT_AMBULATORY_CARE_PROVIDER_SITE_OTHER): Payer: 59

## 2019-10-08 DIAGNOSIS — R002 Palpitations: Secondary | ICD-10-CM | POA: Diagnosis not present

## 2019-10-17 DIAGNOSIS — R002 Palpitations: Secondary | ICD-10-CM | POA: Diagnosis not present

## 2019-10-18 ENCOUNTER — Encounter: Payer: Self-pay | Admitting: Internal Medicine

## 2019-10-18 DIAGNOSIS — M1990 Unspecified osteoarthritis, unspecified site: Secondary | ICD-10-CM | POA: Diagnosis not present

## 2019-10-18 DIAGNOSIS — F19980 Other psychoactive substance use, unspecified with psychoactive substance-induced anxiety disorder: Secondary | ICD-10-CM | POA: Diagnosis not present

## 2019-10-18 DIAGNOSIS — F1120 Opioid dependence, uncomplicated: Secondary | ICD-10-CM | POA: Diagnosis not present

## 2019-10-18 DIAGNOSIS — F988 Other specified behavioral and emotional disorders with onset usually occurring in childhood and adolescence: Secondary | ICD-10-CM | POA: Diagnosis not present

## 2019-10-18 DIAGNOSIS — F1920 Other psychoactive substance dependence, uncomplicated: Secondary | ICD-10-CM | POA: Diagnosis not present

## 2019-10-18 DIAGNOSIS — Z6822 Body mass index (BMI) 22.0-22.9, adult: Secondary | ICD-10-CM | POA: Diagnosis not present

## 2019-10-18 MED FILL — BUPRENORPHINE HCL-NALOXONE: 8-2 | 28 days supply | Qty: 21 | Fill #0

## 2019-11-15 DIAGNOSIS — F988 Other specified behavioral and emotional disorders with onset usually occurring in childhood and adolescence: Secondary | ICD-10-CM | POA: Diagnosis not present

## 2019-11-15 DIAGNOSIS — F1920 Other psychoactive substance dependence, uncomplicated: Secondary | ICD-10-CM | POA: Diagnosis not present

## 2019-11-15 DIAGNOSIS — Z6823 Body mass index (BMI) 23.0-23.9, adult: Secondary | ICD-10-CM | POA: Diagnosis not present

## 2019-11-15 DIAGNOSIS — F19980 Other psychoactive substance use, unspecified with psychoactive substance-induced anxiety disorder: Secondary | ICD-10-CM | POA: Diagnosis not present

## 2019-11-15 DIAGNOSIS — F1120 Opioid dependence, uncomplicated: Secondary | ICD-10-CM | POA: Diagnosis not present

## 2019-11-15 DIAGNOSIS — M1990 Unspecified osteoarthritis, unspecified site: Secondary | ICD-10-CM | POA: Diagnosis not present

## 2019-11-15 MED FILL — BUPRENORPHINE HCL-NALOXONE: 8-2 | 28 days supply | Qty: 21 | Fill #0

## 2019-12-13 DIAGNOSIS — F988 Other specified behavioral and emotional disorders with onset usually occurring in childhood and adolescence: Secondary | ICD-10-CM | POA: Diagnosis not present

## 2019-12-13 DIAGNOSIS — F1120 Opioid dependence, uncomplicated: Secondary | ICD-10-CM | POA: Diagnosis not present

## 2019-12-13 DIAGNOSIS — M1990 Unspecified osteoarthritis, unspecified site: Secondary | ICD-10-CM | POA: Diagnosis not present

## 2019-12-13 DIAGNOSIS — Z6823 Body mass index (BMI) 23.0-23.9, adult: Secondary | ICD-10-CM | POA: Diagnosis not present

## 2019-12-13 DIAGNOSIS — F1920 Other psychoactive substance dependence, uncomplicated: Secondary | ICD-10-CM | POA: Diagnosis not present

## 2019-12-13 DIAGNOSIS — F19980 Other psychoactive substance use, unspecified with psychoactive substance-induced anxiety disorder: Secondary | ICD-10-CM | POA: Diagnosis not present

## 2019-12-13 MED FILL — BUPREN-NALOX FILM 8-2MG: 8-2 | 28 days supply | Qty: 21 | Fill #0

## 2020-01-10 DIAGNOSIS — F1920 Other psychoactive substance dependence, uncomplicated: Secondary | ICD-10-CM | POA: Diagnosis not present

## 2020-01-10 DIAGNOSIS — F19980 Other psychoactive substance use, unspecified with psychoactive substance-induced anxiety disorder: Secondary | ICD-10-CM | POA: Diagnosis not present

## 2020-01-10 DIAGNOSIS — F988 Other specified behavioral and emotional disorders with onset usually occurring in childhood and adolescence: Secondary | ICD-10-CM | POA: Diagnosis not present

## 2020-01-10 DIAGNOSIS — M1990 Unspecified osteoarthritis, unspecified site: Secondary | ICD-10-CM | POA: Diagnosis not present

## 2020-01-10 DIAGNOSIS — Z6823 Body mass index (BMI) 23.0-23.9, adult: Secondary | ICD-10-CM | POA: Diagnosis not present

## 2020-01-10 DIAGNOSIS — F1120 Opioid dependence, uncomplicated: Secondary | ICD-10-CM | POA: Diagnosis not present

## 2020-01-10 MED FILL — BUPRENORPHINE HCL-NALOXONE: 8-2 | 28 days supply | Qty: 21 | Fill #0

## 2020-02-06 DIAGNOSIS — Z23 Encounter for immunization: Secondary | ICD-10-CM | POA: Diagnosis not present

## 2020-02-07 DIAGNOSIS — F19980 Other psychoactive substance use, unspecified with psychoactive substance-induced anxiety disorder: Secondary | ICD-10-CM | POA: Diagnosis not present

## 2020-02-07 DIAGNOSIS — M1990 Unspecified osteoarthritis, unspecified site: Secondary | ICD-10-CM | POA: Diagnosis not present

## 2020-02-07 DIAGNOSIS — F988 Other specified behavioral and emotional disorders with onset usually occurring in childhood and adolescence: Secondary | ICD-10-CM | POA: Diagnosis not present

## 2020-02-07 DIAGNOSIS — F1120 Opioid dependence, uncomplicated: Secondary | ICD-10-CM | POA: Diagnosis not present

## 2020-02-07 DIAGNOSIS — Z6823 Body mass index (BMI) 23.0-23.9, adult: Secondary | ICD-10-CM | POA: Diagnosis not present

## 2020-02-07 DIAGNOSIS — F1920 Other psychoactive substance dependence, uncomplicated: Secondary | ICD-10-CM | POA: Diagnosis not present

## 2020-02-07 MED FILL — BUPRENORPHINE HCL-NALOXONE: 8-2 | 28 days supply | Qty: 21 | Fill #0

## 2020-03-05 DIAGNOSIS — Z23 Encounter for immunization: Secondary | ICD-10-CM | POA: Diagnosis not present

## 2020-03-06 DIAGNOSIS — F19980 Other psychoactive substance use, unspecified with psychoactive substance-induced anxiety disorder: Secondary | ICD-10-CM | POA: Diagnosis not present

## 2020-03-06 DIAGNOSIS — Z6822 Body mass index (BMI) 22.0-22.9, adult: Secondary | ICD-10-CM | POA: Diagnosis not present

## 2020-03-06 DIAGNOSIS — M1990 Unspecified osteoarthritis, unspecified site: Secondary | ICD-10-CM | POA: Diagnosis not present

## 2020-03-06 DIAGNOSIS — F988 Other specified behavioral and emotional disorders with onset usually occurring in childhood and adolescence: Secondary | ICD-10-CM | POA: Diagnosis not present

## 2020-03-06 DIAGNOSIS — F1920 Other psychoactive substance dependence, uncomplicated: Secondary | ICD-10-CM | POA: Diagnosis not present

## 2020-03-06 DIAGNOSIS — F1120 Opioid dependence, uncomplicated: Secondary | ICD-10-CM | POA: Diagnosis not present

## 2020-04-03 DIAGNOSIS — F988 Other specified behavioral and emotional disorders with onset usually occurring in childhood and adolescence: Secondary | ICD-10-CM | POA: Diagnosis not present

## 2020-04-03 DIAGNOSIS — F1920 Other psychoactive substance dependence, uncomplicated: Secondary | ICD-10-CM | POA: Diagnosis not present

## 2020-04-03 DIAGNOSIS — M1990 Unspecified osteoarthritis, unspecified site: Secondary | ICD-10-CM | POA: Diagnosis not present

## 2020-04-03 DIAGNOSIS — Z6822 Body mass index (BMI) 22.0-22.9, adult: Secondary | ICD-10-CM | POA: Diagnosis not present

## 2020-04-03 DIAGNOSIS — F1120 Opioid dependence, uncomplicated: Secondary | ICD-10-CM | POA: Diagnosis not present

## 2020-04-03 DIAGNOSIS — F19980 Other psychoactive substance use, unspecified with psychoactive substance-induced anxiety disorder: Secondary | ICD-10-CM | POA: Diagnosis not present

## 2020-04-07 ENCOUNTER — Encounter: Payer: Self-pay | Admitting: Internal Medicine

## 2020-04-08 ENCOUNTER — Other Ambulatory Visit: Payer: Self-pay | Admitting: Internal Medicine

## 2020-04-08 MED ORDER — SCOPOLAMINE 1 MG/3DAYS TD PT72: 1.0000 | MEDICATED_PATCH | TRANSDERMAL | 0 refills | Status: DC

## 2020-04-08 MED FILL — SCOPOLAMINE 1 MG/3DAYS PT72: 1 | 12 days supply | Qty: 4 | Fill #0

## 2020-05-01 DIAGNOSIS — F1120 Opioid dependence, uncomplicated: Secondary | ICD-10-CM | POA: Diagnosis not present

## 2020-05-01 DIAGNOSIS — F988 Other specified behavioral and emotional disorders with onset usually occurring in childhood and adolescence: Secondary | ICD-10-CM | POA: Diagnosis not present

## 2020-05-01 DIAGNOSIS — Z6822 Body mass index (BMI) 22.0-22.9, adult: Secondary | ICD-10-CM | POA: Diagnosis not present

## 2020-05-01 DIAGNOSIS — F1920 Other psychoactive substance dependence, uncomplicated: Secondary | ICD-10-CM | POA: Diagnosis not present

## 2020-05-01 DIAGNOSIS — M1990 Unspecified osteoarthritis, unspecified site: Secondary | ICD-10-CM | POA: Diagnosis not present

## 2020-05-01 DIAGNOSIS — F19980 Other psychoactive substance use, unspecified with psychoactive substance-induced anxiety disorder: Secondary | ICD-10-CM | POA: Diagnosis not present

## 2020-05-01 MED FILL — BUPRENORPHINE HCL-NALOXONE: 8-2 | 28 days supply | Qty: 21 | Fill #0

## 2020-05-13 ENCOUNTER — Other Ambulatory Visit: Payer: Self-pay

## 2020-05-13 ENCOUNTER — Ambulatory Visit (INDEPENDENT_AMBULATORY_CARE_PROVIDER_SITE_OTHER): Payer: 59 | Admitting: Internal Medicine

## 2020-05-13 ENCOUNTER — Encounter: Payer: Self-pay | Admitting: Internal Medicine

## 2020-05-13 VITALS — BP 116/74 | HR 61 | Temp 98.5°F | Resp 16 | Ht 71.0 in | Wt 159.2 lb

## 2020-05-13 DIAGNOSIS — H81399 Other peripheral vertigo, unspecified ear: Secondary | ICD-10-CM | POA: Diagnosis not present

## 2020-05-13 DIAGNOSIS — R739 Hyperglycemia, unspecified: Secondary | ICD-10-CM | POA: Diagnosis not present

## 2020-05-13 DIAGNOSIS — Z0001 Encounter for general adult medical examination with abnormal findings: Secondary | ICD-10-CM | POA: Diagnosis not present

## 2020-05-13 DIAGNOSIS — H8113 Benign paroxysmal vertigo, bilateral: Secondary | ICD-10-CM | POA: Insufficient documentation

## 2020-05-13 DIAGNOSIS — R9089 Other abnormal findings on diagnostic imaging of central nervous system: Secondary | ICD-10-CM

## 2020-05-13 DIAGNOSIS — Z3009 Encounter for other general counseling and advice on contraception: Secondary | ICD-10-CM | POA: Insufficient documentation

## 2020-05-13 DIAGNOSIS — Z0000 Encounter for general adult medical examination without abnormal findings: Secondary | ICD-10-CM

## 2020-05-13 DIAGNOSIS — R202 Paresthesia of skin: Secondary | ICD-10-CM | POA: Insufficient documentation

## 2020-05-13 MED ORDER — SCOPOLAMINE 1 MG/3DAYS TD PT72: 1.0000 | MEDICATED_PATCH | TRANSDERMAL | 2 refills | Status: DC

## 2020-05-13 MED FILL — SCOPOLAMINE 1 MG/3DAYS PT72: 1 | 30 days supply | Qty: 10 | Fill #0

## 2020-05-13 NOTE — Patient Instructions (Signed)

## 2020-05-13 NOTE — Progress Notes (Signed)
Subjective:  Patient ID: Ryan Spencer, male    DOB: 1986/01/31  Age: 34 y.o. MRN: [id number]  CC: Annual Exam   This visit occurred during the SARS-CoV-2 public health emergency.  Safety protocols were in place, including screening questions prior to the visit, additional usage of staff PPE, and extensive cleaning of exam room while observing appropriate contact time as indicated for disinfecting solutions.    HPI Ryan Spencer presents for a CPX.  He continues to complain of intermittent episodes of dizziness and vertigo.  He also has intermittent tingling in his hands.  He recently used a scopolamine patch to prevent seasickness and noticed that his symptoms drastically improved when he had the scopolamine patch on.  He admits that he intermittently feels anxious but he denies panic attacks or signs and symptoms of depression.  He wants to be referred for vasectomy.  Outpatient Medications Prior to Visit  Medication Sig Dispense Refill  . Buprenorphine HCl-Naloxone HCl 8-2 MG FILM     . scopolamine (TRANSDERM-SCOP) 1 MG/3DAYS Place 1 patch (1.5 mg total) onto the skin every 3 (three) days. (Patient not taking: Reported on 05/13/2020) 4 patch 0   No facility-administered medications prior to visit.    ROS Review of Systems  Constitutional: Negative for appetite change, diaphoresis and fatigue.  HENT: Negative.  Negative for trouble swallowing.   Eyes: Negative.  Negative for visual disturbance.  Respiratory: Negative for cough, chest tightness, shortness of breath and wheezing.   Cardiovascular: Negative for chest pain, palpitations and leg swelling.  Gastrointestinal: Negative for abdominal pain, constipation, diarrhea, nausea and vomiting.  Genitourinary: Negative for difficulty urinating and dysuria.  Musculoskeletal: Negative for arthralgias, gait problem, myalgias and neck pain.  Skin: Negative.  Negative for color change and rash.  Neurological: Positive for dizziness.  Negative for seizures, syncope, speech difficulty, weakness, light-headedness, numbness and headaches.  Hematological: Negative for adenopathy. Does not bruise/bleed easily.  Psychiatric/Behavioral: Negative for behavioral problems, confusion, decreased concentration, dysphoric mood, hallucinations, sleep disturbance and suicidal ideas. The patient is nervous/anxious.     Objective:  BP 116/74 (BP Location: Left Arm, Patient Position: Sitting, Cuff Size: Normal)   Pulse 61   Temp 98.5 F (36.9 C) (Oral)   Resp 16   Ht 5\' 11"  (1.803 m)   Wt 159 lb 4 oz (72.2 kg)   SpO2 98%   BMI 22.21 kg/m   BP Readings from Last 3 Encounters:  05/13/20 116/74  08/28/19 116/64  04/25/19 120/62    Wt Readings from Last 3 Encounters:  05/13/20 159 lb 4 oz (72.2 kg)  08/28/19 162 lb (73.5 kg)  04/25/19 160 lb (72.6 kg)    Physical Exam Vitals reviewed.  Constitutional:      Appearance: Normal appearance. He is not ill-appearing.  HENT:     Nose: Nose normal.     Mouth/Throat:     Mouth: Mucous membranes are moist.  Eyes:     General: No scleral icterus.    Extraocular Movements: Extraocular movements intact.     Conjunctiva/sclera: Conjunctivae normal.     Pupils: Pupils are equal, round, and reactive to light.  Cardiovascular:     Rate and Rhythm: Normal rate and regular rhythm.     Heart sounds: No murmur heard.   Pulmonary:     Effort: Pulmonary effort is normal.     Breath sounds: No stridor. No wheezing, rhonchi or rales.  Abdominal:     General: Abdomen is flat.  Palpations: There is no mass.     Tenderness: There is no abdominal tenderness. There is no guarding.  Musculoskeletal:        General: Normal range of motion.     Cervical back: Neck supple.     Right lower leg: No edema.     Left lower leg: No edema.  Lymphadenopathy:     Cervical: No cervical adenopathy.  Skin:    General: Skin is warm and dry.     Coloration: Skin is not pale.  Neurological:      General: No focal deficit present.     Mental Status: He is alert. Mental status is at baseline.     Cranial Nerves: Cranial nerves are intact.     Sensory: Sensation is intact.     Motor: Motor function is intact. No weakness or atrophy.     Coordination: Coordination is intact.     Gait: Gait is intact. Gait normal.     Deep Tendon Reflexes: Reflexes normal. Babinski sign absent on the right side. Babinski sign absent on the left side.     Reflex Scores:      Tricep reflexes are 0 on the right side and 0 on the left side.      Bicep reflexes are 0 on the right side and 0 on the left side.      Brachioradialis reflexes are 0 on the right side and 0 on the left side.      Patellar reflexes are 1+ on the right side and 1+ on the left side.      Achilles reflexes are 1+ on the right side and 1+ on the left side. Psychiatric:        Mood and Affect: Mood normal.        Behavior: Behavior normal.     Lab Results  Component Value Date   WBC 4.2 05/13/2020   HGB 14.2 05/13/2020   HCT 41.9 05/13/2020   PLT 207 05/13/2020   GLUCOSE 102 (H) 05/13/2020   CHOL 164 04/25/2019   TRIG 77.0 04/25/2019   HDL 59.00 04/25/2019   LDLCALC 90 04/25/2019   ALT 9 04/25/2019   AST 16 04/25/2019   NA 140 05/13/2020   K 4.3 05/13/2020   CL 103 05/13/2020   CREATININE 0.96 05/13/2020   BUN 12 05/13/2020   CO2 32 05/13/2020   TSH 1.97 08/28/2019   HGBA1C 5.0 05/13/2020    No results found.  Assessment & Plan:   Alwyn was seen today for annual exam.  Diagnoses and all orders for this visit:  Vertigo, benign paroxysmal, bilateral- I have asked him to undergo an MRI of the brain to see if there is a CNS lesion causing this and to screen for demyelinating disease. -     CBC with Differential/Platelet; Future -     scopolamine (TRANSDERM-SCOP) 1 MG/3DAYS; Place 1 patch (1.5 mg total) onto the skin every 3 (three) days. -     CBC with Differential/Platelet -     MR Brain W Wo Contrast;  Future  Tingling of both upper extremities- I have asked him to undergo an electrical study to try to identify the cause for the tingling in his extremities and to screen for demyelinating disease. -     CBC with Differential/Platelet; Future -     Vitamin B12; Future -     Folate; Future -     Folate -     Vitamin B12 -  CBC with Differential/Platelet -     MR Brain W Wo Contrast; Future -     Ambulatory referral to Neurology  Chronic hyperglycemia- His A1c is normal at 5.0%. -     Basic metabolic panel; Future -     Hemoglobin A1c; Future -     Hemoglobin A1c -     Basic metabolic panel  Visit for vasectomy evaluation -     Ambulatory referral to Urology  Other peripheral vertigo, unspecified ear -     MR Brain W Wo Contrast; Future  Routine general medical examination at a health care facility- Exam completed, labs reviewed, vaccines reviewed and updated, patient education was given.   I am having Josephmichael Lisenbee. [place] maintain his Buprenorphine HCl-Naloxone HCl and scopolamine.  Meds ordered this encounter  Medications  . scopolamine (TRANSDERM-SCOP) 1 MG/3DAYS    Sig: Place 1 patch (1.5 mg total) onto the skin every 3 (three) days.    Dispense:  10 patch    Refill:  2     Follow-up: Return if symptoms worsen or fail to improve.  Sanda Linger, MD

## 2020-05-14 ENCOUNTER — Encounter: Payer: Self-pay | Admitting: Internal Medicine

## 2020-05-14 LAB — BASIC METABOLIC PANEL
BUN: 12 mg/dL (ref 7–25)
CO2: 32 mmol/L (ref 20–32)
Calcium: 9.8 mg/dL (ref 8.6–10.3)
Chloride: 103 mmol/L (ref 98–110)
Creat: 0.96 mg/dL (ref 0.60–1.35)
Glucose, Bld: 102 mg/dL — ABNORMAL HIGH (ref 65–99)
Potassium: 4.3 mmol/L (ref 3.5–5.3)
Sodium: 140 mmol/L (ref 135–146)

## 2020-05-14 LAB — CBC WITH DIFFERENTIAL/PLATELET
Absolute Monocytes: 386 cells/uL (ref 200–950)
Basophils Absolute: 29 cells/uL (ref 0–200)
Basophils Relative: 0.7 %
Eosinophils Absolute: 546 cells/uL — ABNORMAL HIGH (ref 15–500)
Eosinophils Relative: 13 %
HCT: 41.9 % (ref 38.5–50.0)
Hemoglobin: 14.2 g/dL (ref 13.2–17.1)
Lymphs Abs: 1739 cells/uL (ref 850–3900)
MCH: 31 pg (ref 27.0–33.0)
MCHC: 33.9 g/dL (ref 32.0–36.0)
MCV: 91.5 fL (ref 80.0–100.0)
MPV: 10.9 fL (ref 7.5–12.5)
Monocytes Relative: 9.2 %
Neutro Abs: 1499 cells/uL — ABNORMAL LOW (ref 1500–7800)
Neutrophils Relative %: 35.7 %
Platelets: 207 10*3/uL (ref 140–400)
RBC: 4.58 10*6/uL (ref 4.20–5.80)
RDW: 12.7 % (ref 11.0–15.0)
Total Lymphocyte: 41.4 %
WBC: 4.2 10*3/uL (ref 3.8–10.8)

## 2020-05-14 LAB — HEMOGLOBIN A1C
Hgb A1c MFr Bld: 5 % of total Hgb (ref <5.7)
Mean Plasma Glucose: 97 (calc)
eAG (mmol/L): 5.4 (calc)

## 2020-05-14 LAB — FOLATE: Folate: >24 ng/mL

## 2020-05-14 LAB — VITAMIN B12: Vitamin B-12: 496 pg/mL (ref 200–1100)

## 2020-05-17 DIAGNOSIS — Z0000 Encounter for general adult medical examination without abnormal findings: Secondary | ICD-10-CM | POA: Insufficient documentation

## 2020-05-17 DIAGNOSIS — H81399 Other peripheral vertigo, unspecified ear: Secondary | ICD-10-CM | POA: Insufficient documentation

## 2020-05-29 DIAGNOSIS — F1920 Other psychoactive substance dependence, uncomplicated: Secondary | ICD-10-CM | POA: Diagnosis not present

## 2020-05-29 DIAGNOSIS — F1120 Opioid dependence, uncomplicated: Secondary | ICD-10-CM | POA: Diagnosis not present

## 2020-05-29 DIAGNOSIS — F988 Other specified behavioral and emotional disorders with onset usually occurring in childhood and adolescence: Secondary | ICD-10-CM | POA: Diagnosis not present

## 2020-05-29 DIAGNOSIS — Z6822 Body mass index (BMI) 22.0-22.9, adult: Secondary | ICD-10-CM | POA: Diagnosis not present

## 2020-05-29 DIAGNOSIS — F19980 Other psychoactive substance use, unspecified with psychoactive substance-induced anxiety disorder: Secondary | ICD-10-CM | POA: Diagnosis not present

## 2020-05-29 DIAGNOSIS — M1990 Unspecified osteoarthritis, unspecified site: Secondary | ICD-10-CM | POA: Diagnosis not present

## 2020-05-29 MED FILL — BUPRENORPHINE HCL-NALOXONE: 8-2 | 28 days supply | Qty: 21 | Fill #0

## 2020-05-29 MED FILL — SCOPOLAMINE 1 MG/3DAYS PT72: 1 | 30 days supply | Qty: 10 | Fill #0

## 2020-06-13 ENCOUNTER — Other Ambulatory Visit: Payer: Self-pay

## 2020-06-13 ENCOUNTER — Ambulatory Visit
Admission: RE | Admit: 2020-06-13 | Discharge: 2020-06-13 | Disposition: A | Discharge status: Home / Self Care | Payer: 59 | Source: Ambulatory Visit | Attending: Internal Medicine | Admitting: Internal Medicine

## 2020-06-13 DIAGNOSIS — H81393 Other peripheral vertigo, bilateral: Secondary | ICD-10-CM | POA: Diagnosis not present

## 2020-06-13 DIAGNOSIS — H8113 Benign paroxysmal vertigo, bilateral: Secondary | ICD-10-CM

## 2020-06-13 DIAGNOSIS — R202 Paresthesia of skin: Secondary | ICD-10-CM

## 2020-06-13 DIAGNOSIS — H81399 Other peripheral vertigo, unspecified ear: Secondary | ICD-10-CM

## 2020-06-13 IMAGING — MR MR HEAD WO/W CM
13 series · 48 of 48 positions shown · IV contrast (15ml Multihance)
Comparison: No pertinent prior exams are available for comparison.

CLINICAL DATA: Vertigo, benign paroxysmal, bilateral. Tingling of
both extremities. Other peripheral vertigo, unspecified ear.
Additional history provided by scanning technologist: Patient
reports tingling and numbness in bilateral hands for 4-5 months.

EXAM:
MRI HEAD WITHOUT AND WITH CONTRAST
TECHNIQUE: Multiplanar, multiecho pulse sequences of the brain and surrounding
structures were obtained without and with intravenous contrast.
CONTRAST:  15mL MULTIHANCE GADOBENATE DIMEGLUMINE 529 MG/ML IV SOLN

[Series 2: T1 · sagittal · 5.0mm · 0.45mm/px · 2 of 23 slices shown]
[im 1/23]
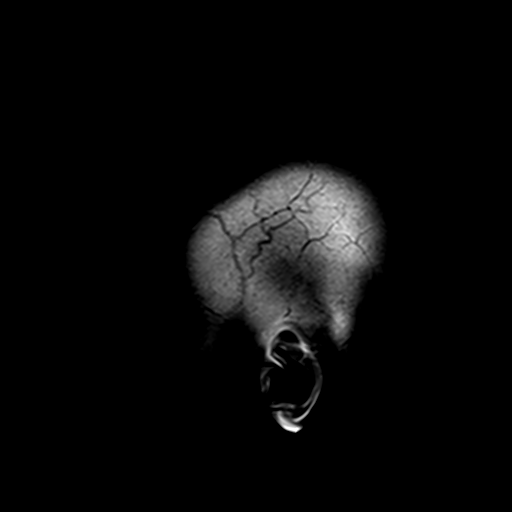
[im 23/23]
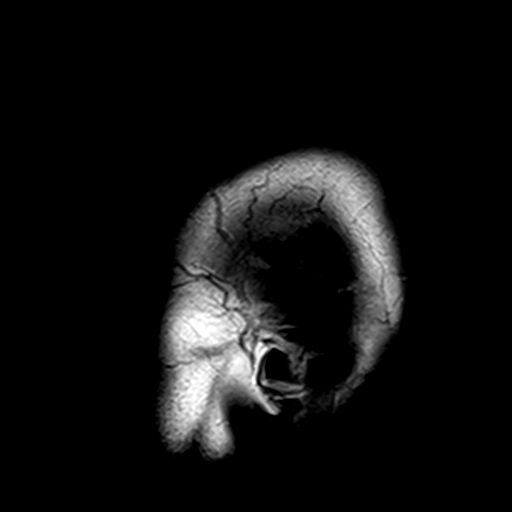

[Series 3: DWI · axial · 3.0mm · 1.80mm/px · z∈[-36,+113]mm · 7 of 102 slices shown (1 of 4)]
[im 1/102]
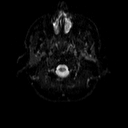
[im 17/102]
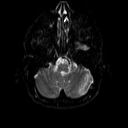
[im 34/102]
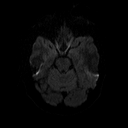
[im 51/102]
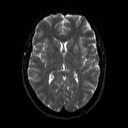
[im 68/102]
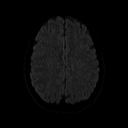
[im 85/102]
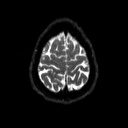
[im 102/102]
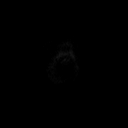

[Series 4: DWI · axial · 3.0mm · 1.80mm/px · z∈[-36,+113]mm · 3 of 49 slices shown (2 of 4)]
[im 1/49]
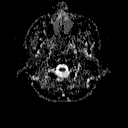
[im 25/49]
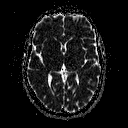
[im 49/49]
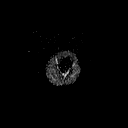

[Series 5: DWI · coronal · 5.0mm · 1.80mm/px · 5 of 76 slices shown (3 of 4)]
[im 1/76]
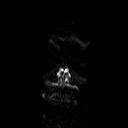
[im 19/76]
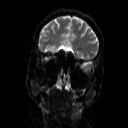
[im 38/76]
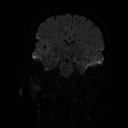
[im 57/76]
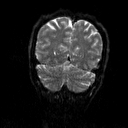
[im 76/76]
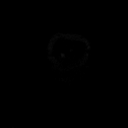

[Series 6: DWI · coronal · 5.0mm · 1.80mm/px · 2 of 38 slices shown (4 of 4)]
[im 1/38]
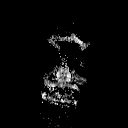
[im 38/38]
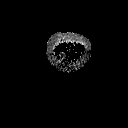

[Series 7: T2 · axial · 5.0mm · 0.60mm/px · 1 of 23 slices shown (1 of 2)]
[im 1/23]
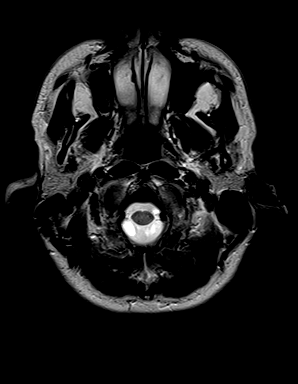

[Series 8: FLAIR · axial · 3.0mm · 0.45mm/px · z∈[-37,+111]mm · 2 of 33 slices shown (1 of 2)]
[im 1/33]
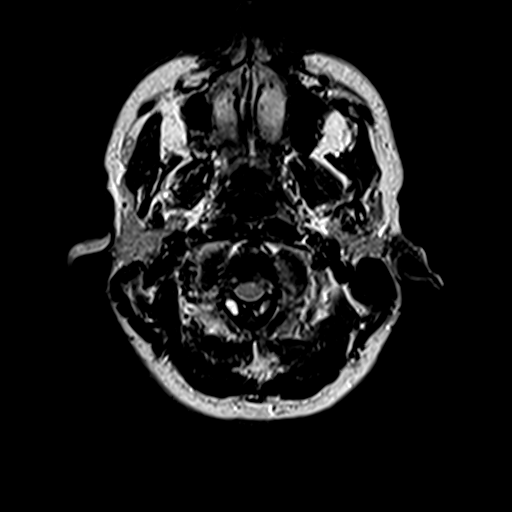
[im 33/33]
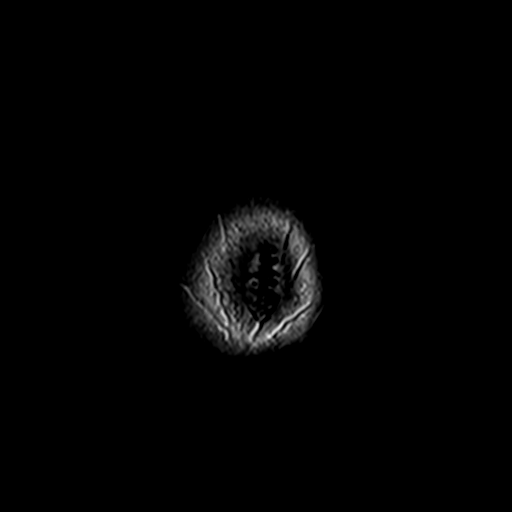

[Series 10: swi_images · axial · 4.0mm · 0.90mm/px · z∈[-33,+106]mm · 2 of 36 slices shown]
[im 1/36]
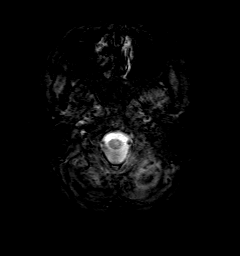
[im 36/36]
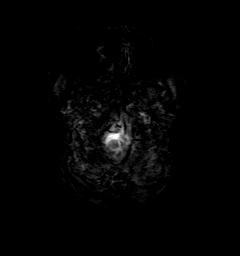

[Series 11: t1_mpr_tra · axial · 1.0mm · 0.75mm/px · z∈[-36,+107]mm · 9 of 144 slices shown (1 of 2)]
[im 1/144]
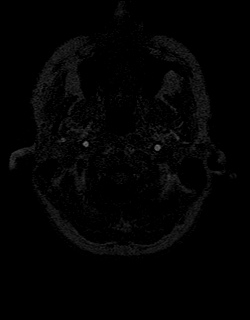
[im 18/144]
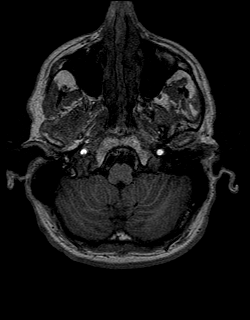
[im 36/144]
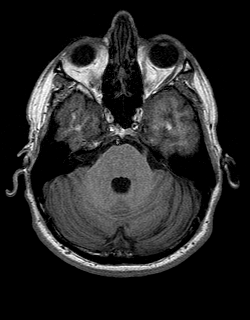
[im 54/144]
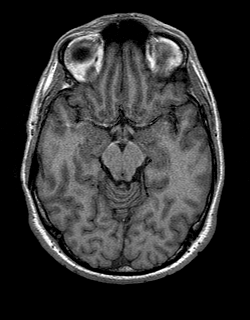
[im 72/144]
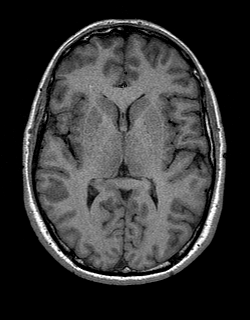
[im 90/144]
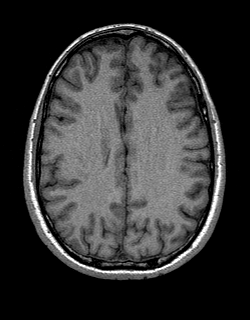
[im 108/144]
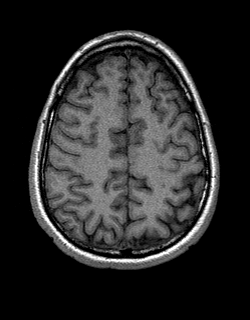
[im 126/144]
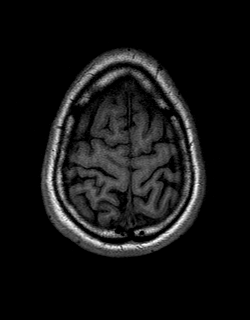
[im 144/144]
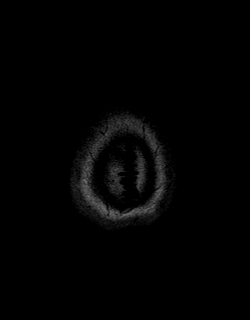

[Series 12: T2 · coronal · 5.0mm · 0.45mm/px · 2 of 30 slices shown (2 of 2)]
[im 1/30]
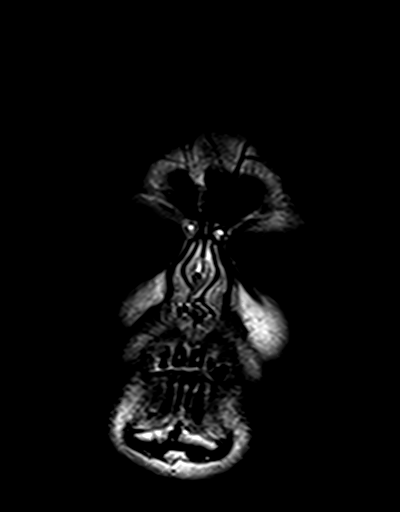
[im 30/30]
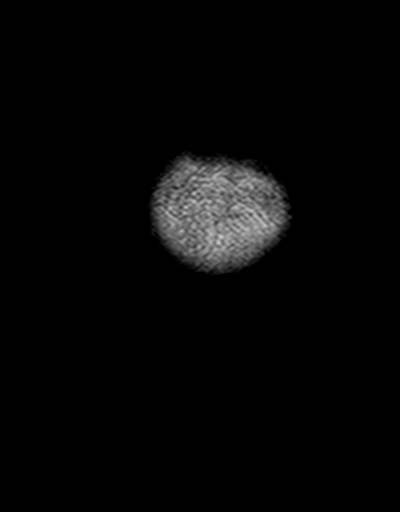

[Series 13: t1_mpr_tra · axial · 1.0mm · 0.75mm/px · z∈[-36,+107]mm · 9 of 144 slices shown (2 of 2)]
[im 1/144]
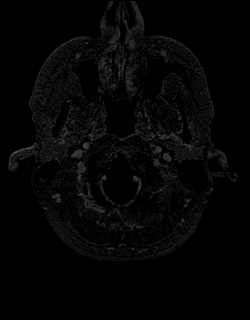
[im 18/144]
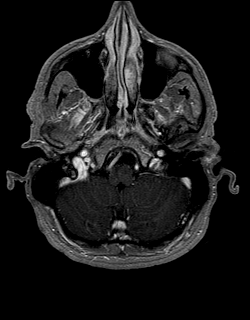
[im 36/144]
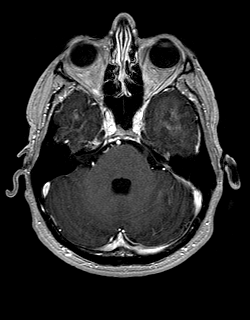
[im 54/144]
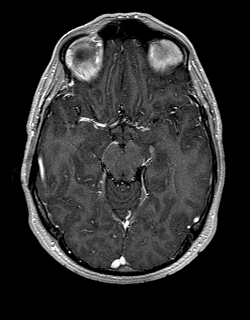
[im 72/144]
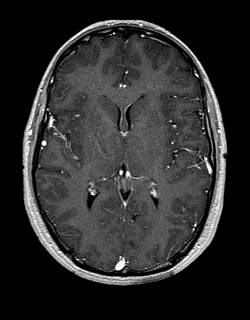
[im 90/144]
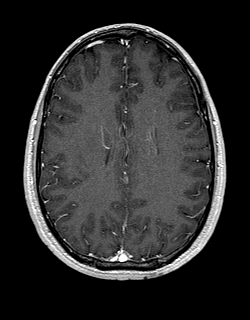
[im 108/144]
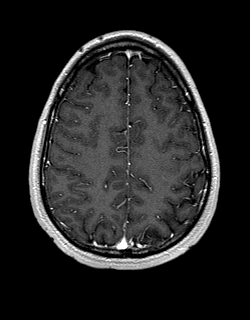
[im 126/144]
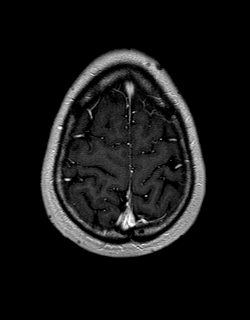
[im 144/144]
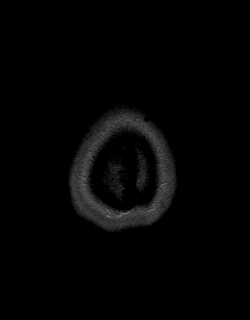

[Series 14: post cor · coronal · 5.0mm · 0.45mm/px · 2 of 30 slices shown]
[im 1/30]
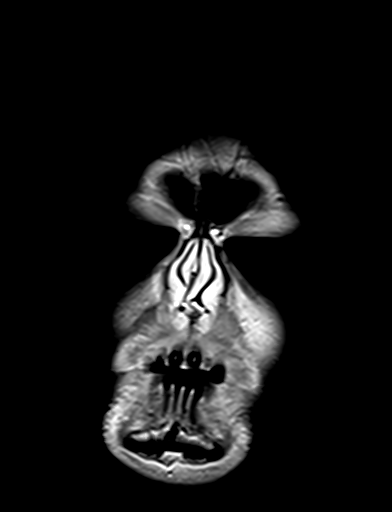
[im 30/30]
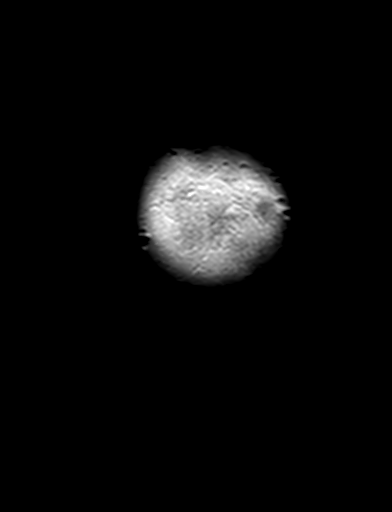

[Series 15: FLAIR · sagittal · 5.0mm · 0.45mm/px · 2 of 27 slices shown (2 of 2)]
[im 1/27]
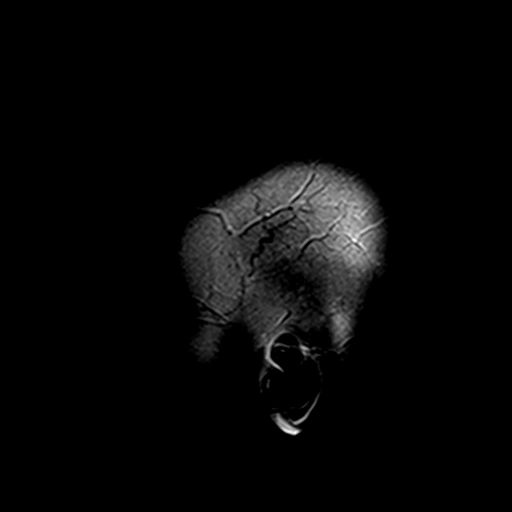
[im 27/27]
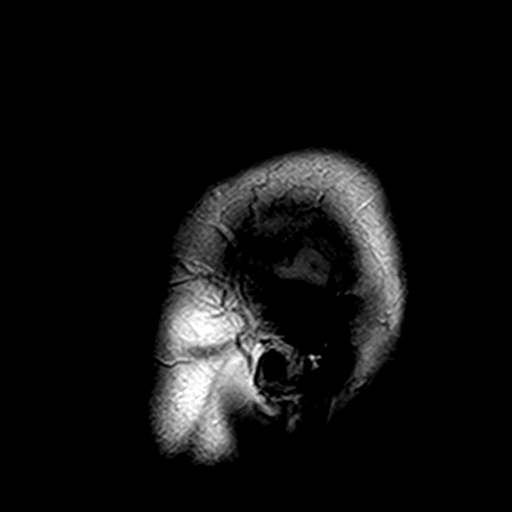

[48 of 48 positions shown; findings below may reference images not displayed]

FINDINGS: Brain:

Cerebral volume is normal.

There is asymmetric ill-defined T2/FLAIR hyperintensity within the
anterior aspect of the left putamen (for instance as seen on series
8, image 16) (series 12, image 19) (series 15, image 18). There is
no corresponding restricted diffusion or abnormal enhancement.

No focal parenchymal signal abnormality is identified elsewhere.

No abnormal intracranial enhancement.

There is no acute infarct.

No evidence of intracranial mass.

No chronic intracranial blood products.

No extra-axial fluid collection.

No midline shift.

Vascular: Expected proximal arterial flow voids.

Skull and upper cervical spine: No focal marrow lesion

Sinuses/Orbits: Visualized orbits show no acute finding. Mild
ethmoid sinus mucosal thickening. No significant mastoid effusion.
IMPRESSION: Asymmetric ill-defined and nonenhancing T2 hyperintensity within the
anterior aspect of the left putamen. This finding is favored to be
developmental or related to a remote insult. However, 3-6 month MRI
follow-up is recommended to ensure stability and exclude alternative
etiologies (i.e. metabolic, neoplastic)

Otherwise unremarkable MRI appearance of the brain.

Mild ethmoid sinus mucosal thickening.

## 2020-06-13 MED ORDER — GADOBENATE DIMEGLUMINE 529 MG/ML IV SOLN
15.0000 mL | Freq: Once | INTRAVENOUS | Status: AC | PRN
  Administered 2020-06-13: 15 mL via INTRAVENOUS

## 2020-06-18 ENCOUNTER — Encounter: Payer: 59 | Admitting: Neurology

## 2020-06-26 DIAGNOSIS — M1990 Unspecified osteoarthritis, unspecified site: Secondary | ICD-10-CM | POA: Diagnosis not present

## 2020-06-26 DIAGNOSIS — F19980 Other psychoactive substance use, unspecified with psychoactive substance-induced anxiety disorder: Secondary | ICD-10-CM | POA: Diagnosis not present

## 2020-06-26 DIAGNOSIS — F1920 Other psychoactive substance dependence, uncomplicated: Secondary | ICD-10-CM | POA: Diagnosis not present

## 2020-06-26 DIAGNOSIS — F1120 Opioid dependence, uncomplicated: Secondary | ICD-10-CM | POA: Diagnosis not present

## 2020-06-26 DIAGNOSIS — F988 Other specified behavioral and emotional disorders with onset usually occurring in childhood and adolescence: Secondary | ICD-10-CM | POA: Diagnosis not present

## 2020-06-26 DIAGNOSIS — Z6822 Body mass index (BMI) 22.0-22.9, adult: Secondary | ICD-10-CM | POA: Diagnosis not present

## 2020-06-26 MED FILL — BUPRENORPHINE HCL-NALOXONE: 8-2 | 28 days supply | Qty: 21 | Fill #0

## 2020-07-04 DIAGNOSIS — Z3009 Encounter for other general counseling and advice on contraception: Secondary | ICD-10-CM | POA: Diagnosis not present

## 2020-07-24 DIAGNOSIS — Z6823 Body mass index (BMI) 23.0-23.9, adult: Secondary | ICD-10-CM | POA: Diagnosis not present

## 2020-07-24 DIAGNOSIS — F19980 Other psychoactive substance use, unspecified with psychoactive substance-induced anxiety disorder: Secondary | ICD-10-CM | POA: Diagnosis not present

## 2020-07-24 DIAGNOSIS — F1920 Other psychoactive substance dependence, uncomplicated: Secondary | ICD-10-CM | POA: Diagnosis not present

## 2020-07-24 DIAGNOSIS — F1120 Opioid dependence, uncomplicated: Secondary | ICD-10-CM | POA: Diagnosis not present

## 2020-07-24 DIAGNOSIS — M1990 Unspecified osteoarthritis, unspecified site: Secondary | ICD-10-CM | POA: Diagnosis not present

## 2020-07-24 DIAGNOSIS — F988 Other specified behavioral and emotional disorders with onset usually occurring in childhood and adolescence: Secondary | ICD-10-CM | POA: Diagnosis not present

## 2020-07-24 MED FILL — BUPRENORPHINE HCL-NALOXONE: 8-2 | 28 days supply | Qty: 21 | Fill #0

## 2020-08-13 ENCOUNTER — Encounter: Payer: 59 | Admitting: Neurology

## 2020-08-13 ENCOUNTER — Encounter: Payer: Self-pay | Admitting: Neurology

## 2020-08-21 ENCOUNTER — Other Ambulatory Visit (HOSPITAL_COMMUNITY): Payer: Self-pay | Admitting: Internal Medicine

## 2020-08-21 DIAGNOSIS — F1120 Opioid dependence, uncomplicated: Secondary | ICD-10-CM | POA: Diagnosis not present

## 2020-08-21 DIAGNOSIS — M1990 Unspecified osteoarthritis, unspecified site: Secondary | ICD-10-CM | POA: Diagnosis not present

## 2020-08-21 DIAGNOSIS — F19980 Other psychoactive substance use, unspecified with psychoactive substance-induced anxiety disorder: Secondary | ICD-10-CM | POA: Diagnosis not present

## 2020-08-21 DIAGNOSIS — F1920 Other psychoactive substance dependence, uncomplicated: Secondary | ICD-10-CM | POA: Diagnosis not present

## 2020-08-21 DIAGNOSIS — F988 Other specified behavioral and emotional disorders with onset usually occurring in childhood and adolescence: Secondary | ICD-10-CM | POA: Diagnosis not present

## 2020-08-21 DIAGNOSIS — Z6823 Body mass index (BMI) 23.0-23.9, adult: Secondary | ICD-10-CM | POA: Diagnosis not present

## 2020-08-21 MED FILL — BUPRENORPHINE HCL-NALOXONE: 8-2 | 28 days supply | Qty: 21 | Fill #0

## 2020-09-09 DIAGNOSIS — Z302 Encounter for sterilization: Secondary | ICD-10-CM | POA: Diagnosis not present

## 2020-09-18 ENCOUNTER — Other Ambulatory Visit (HOSPITAL_COMMUNITY): Payer: Self-pay | Admitting: Internal Medicine

## 2020-09-18 DIAGNOSIS — M1990 Unspecified osteoarthritis, unspecified site: Secondary | ICD-10-CM | POA: Diagnosis not present

## 2020-09-18 DIAGNOSIS — F1120 Opioid dependence, uncomplicated: Secondary | ICD-10-CM | POA: Diagnosis not present

## 2020-09-18 DIAGNOSIS — F988 Other specified behavioral and emotional disorders with onset usually occurring in childhood and adolescence: Secondary | ICD-10-CM | POA: Diagnosis not present

## 2020-09-18 DIAGNOSIS — F1920 Other psychoactive substance dependence, uncomplicated: Secondary | ICD-10-CM | POA: Diagnosis not present

## 2020-09-18 DIAGNOSIS — Z6823 Body mass index (BMI) 23.0-23.9, adult: Secondary | ICD-10-CM | POA: Diagnosis not present

## 2020-09-18 MED FILL — BUPRENORPHINE HCL-NALOXONE: 8-2 | 28 days supply | Qty: 21 | Fill #0

## 2020-09-23 ENCOUNTER — Other Ambulatory Visit: Payer: Self-pay | Admitting: Internal Medicine

## 2020-09-23 DIAGNOSIS — R9089 Other abnormal findings on diagnostic imaging of central nervous system: Secondary | ICD-10-CM | POA: Insufficient documentation

## 2020-09-23 DIAGNOSIS — R202 Paresthesia of skin: Secondary | ICD-10-CM

## 2020-10-11 ENCOUNTER — Ambulatory Visit
Admission: RE | Admit: 2020-10-11 | Discharge: 2020-10-11 | Disposition: A | Discharge status: Home / Self Care | Payer: 59 | Source: Ambulatory Visit | Attending: Internal Medicine | Admitting: Internal Medicine

## 2020-10-11 DIAGNOSIS — R202 Paresthesia of skin: Secondary | ICD-10-CM

## 2020-10-11 DIAGNOSIS — R42 Dizziness and giddiness: Secondary | ICD-10-CM | POA: Diagnosis not present

## 2020-10-11 DIAGNOSIS — R9089 Other abnormal findings on diagnostic imaging of central nervous system: Secondary | ICD-10-CM

## 2020-10-11 DIAGNOSIS — R200 Anesthesia of skin: Secondary | ICD-10-CM | POA: Diagnosis not present

## 2020-10-11 IMAGING — MR MR HEAD WO/W CM
13 series · 48 of 48 positions shown · IV contrast (multihance)
Comparison: Brain MRI [DATE].

CLINICAL DATA: 34-year-old male with asymmetric T2 signal in the
left basal ganglia on [facility] brain MRI for vertigo, extremity
numbness and tingling. Subsequent encounter.

EXAM:
MRI HEAD WITHOUT AND WITH CONTRAST
TECHNIQUE: Multiplanar, multiecho pulse sequences of the brain and surrounding
structures were obtained without and with intravenous contrast.
CONTRAST:  15mL MULTIHANCE GADOBENATE DIMEGLUMINE 529 MG/ML IV SOLN

[Series 2: T1 · sagittal · 5.0mm · 0.45mm/px · 2 of 21 slices shown]
[im 1/21]
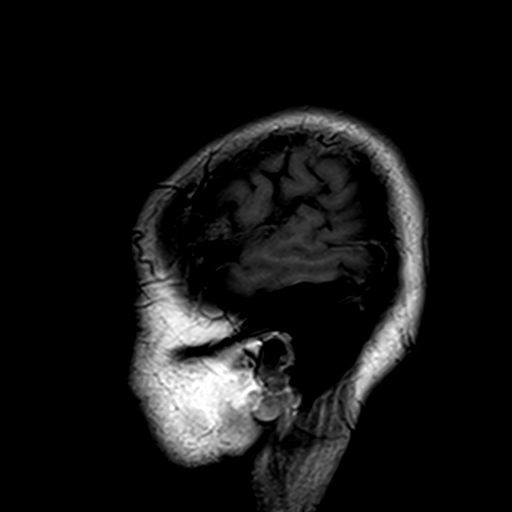
[im 21/21]
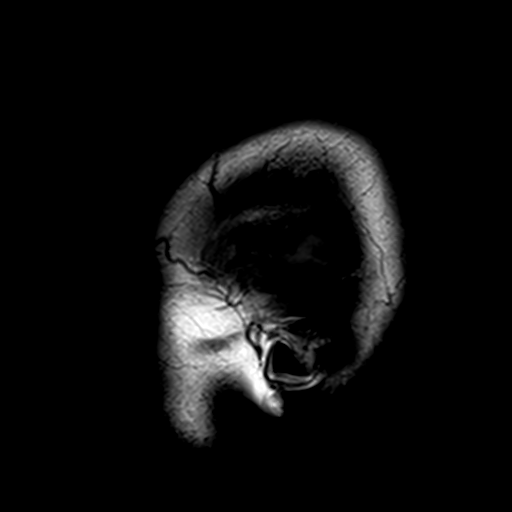

[Series 3: DWI · axial · 3.0mm · 1.80mm/px · z∈[-54,+92]mm · 8 of 100 slices shown (1 of 4)]
[im 1/100]
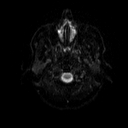
[im 15/100]
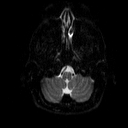
[im 29/100]
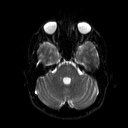
[im 43/100]
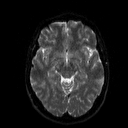
[im 57/100]
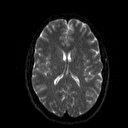
[im 71/100]
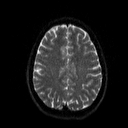
[im 85/100]
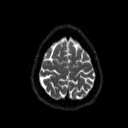
[im 100/100]
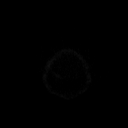

[Series 4: DWI · axial · 3.0mm · 1.80mm/px · z∈[-54,+92]mm · 3 of 46 slices shown (2 of 4)]
[im 1/46]
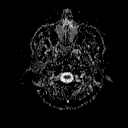
[im 23/46]
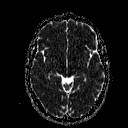
[im 46/46]
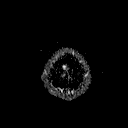

[Series 5: DWI · coronal · 5.0mm · 1.80mm/px · 5 of 74 slices shown (3 of 4)]
[im 1/74]
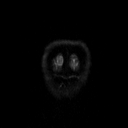
[im 19/74]
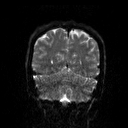
[im 37/74]
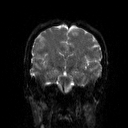
[im 55/74]
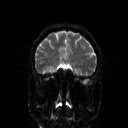
[im 74/74]
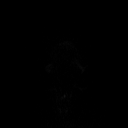

[Series 6: DWI · coronal · 5.0mm · 1.80mm/px · 2 of 37 slices shown (4 of 4)]
[im 1/37]
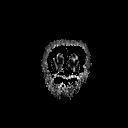
[im 37/37]
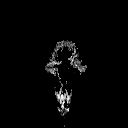

[Series 7: T2 · axial · 5.0mm · 0.60mm/px · 1 of 22 slices shown (1 of 2)]
[im 1/22]
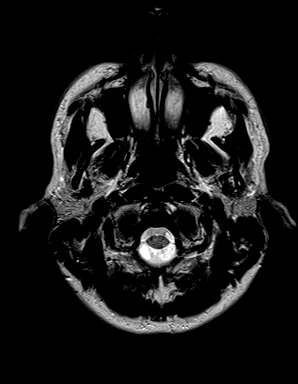

[Series 8: FLAIR · axial · 3.0mm · 0.45mm/px · z∈[-48,+86]mm · 2 of 30 slices shown]
[im 1/30]
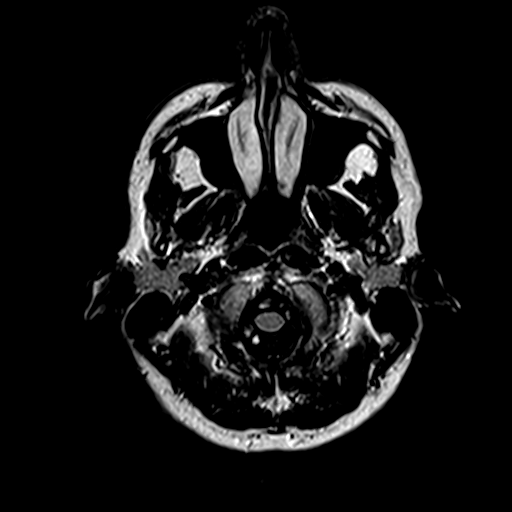
[im 30/30]
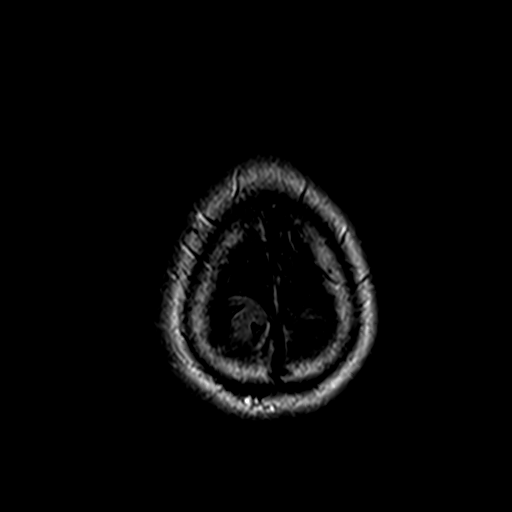

[Series 10: swi_images · axial · 4.0mm · 0.90mm/px · z∈[-51,+89]mm · 2 of 36 slices shown]
[im 1/36]
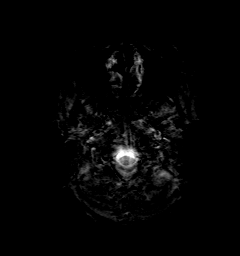
[im 36/36]
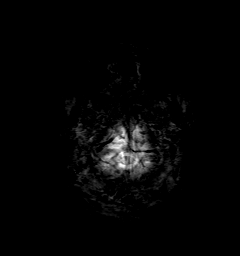

[Series 11: t1_mpr_tra · axial · 1.0mm · 0.75mm/px · z∈[-53,+90]mm · 9 of 144 slices shown (1 of 2)]
[im 1/144]
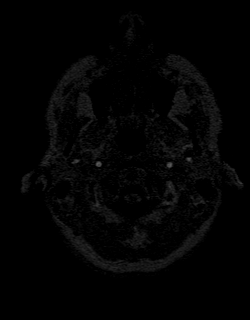
[im 18/144]
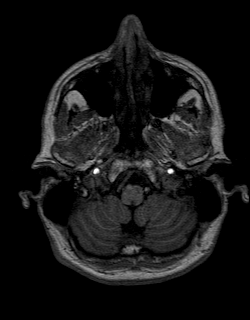
[im 36/144]
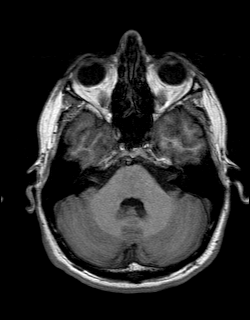
[im 54/144]
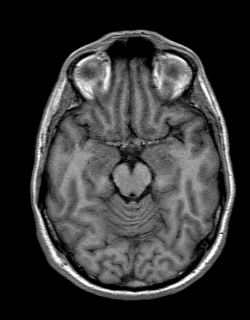
[im 72/144]
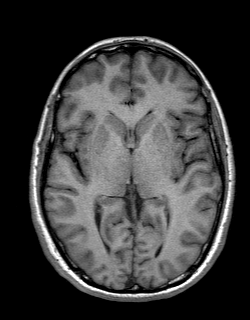
[im 90/144]
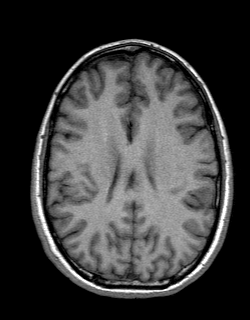
[im 108/144]
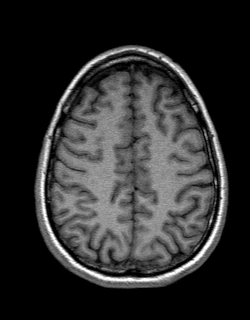
[im 126/144]
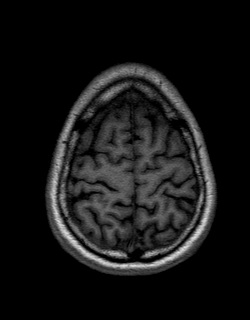
[im 144/144]
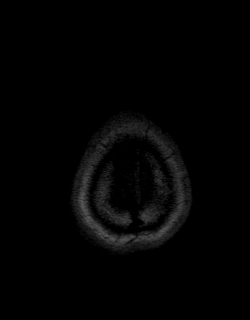

[Series 12: T2 · coronal · 5.0mm · 0.45mm/px · 2 of 30 slices shown (2 of 2)]
[im 1/30]
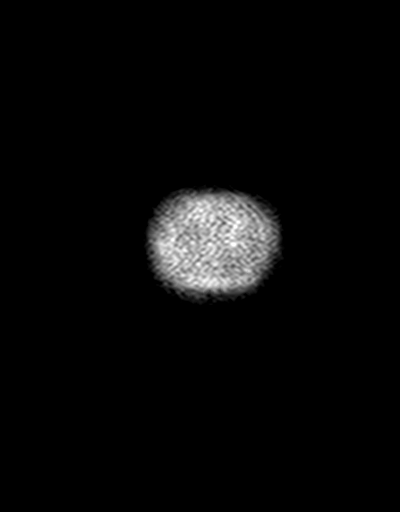
[im 30/30]
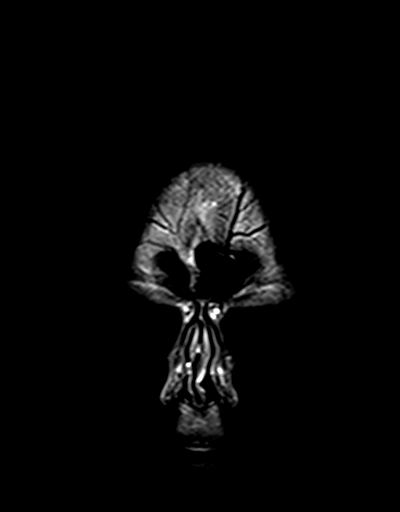

[Series 13: t1_mpr_tra · axial · 1.0mm · 0.75mm/px · z∈[-53,+90]mm · 9 of 144 slices shown (2 of 2)]
[im 1/144]
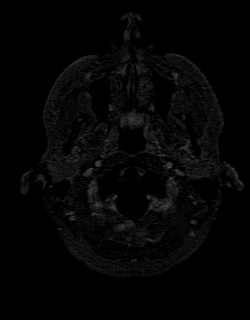
[im 18/144]
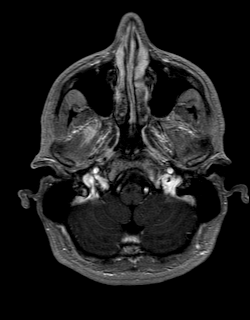
[im 36/144]
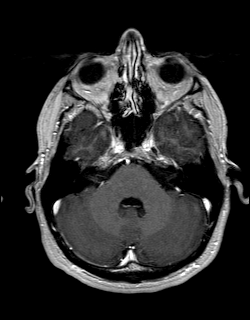
[im 54/144]
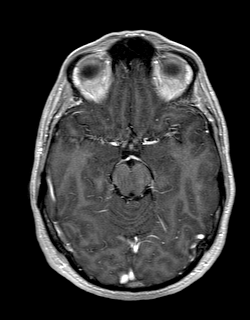
[im 72/144]
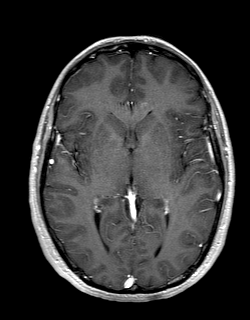
[im 90/144]
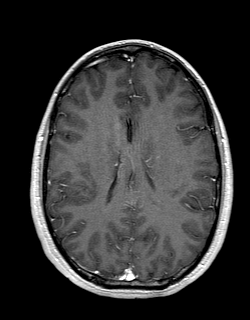
[im 108/144]
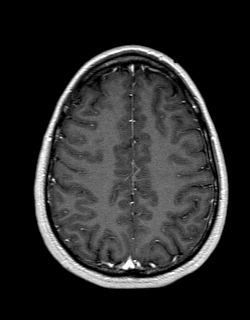
[im 126/144]
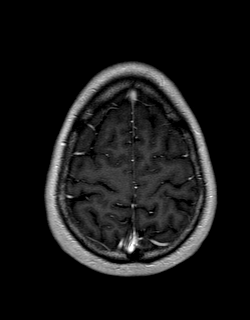
[im 144/144]
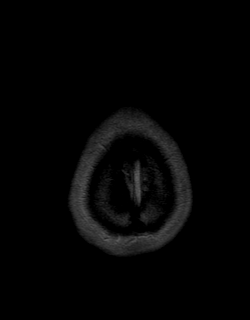

[Series 14: post cor · coronal · 5.0mm · 0.45mm/px · 2 of 30 slices shown]
[im 1/30]
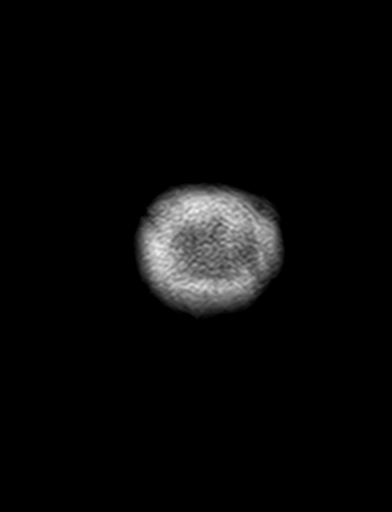
[im 30/30]
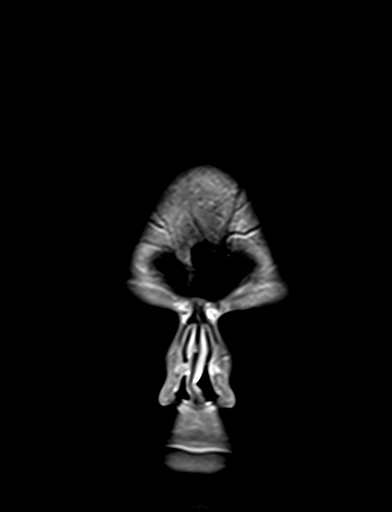

[Series 15: post sag (optional · sagittal · 5.0mm · 0.45mm/px · 1 of 22 slices shown]
[im 1/22]
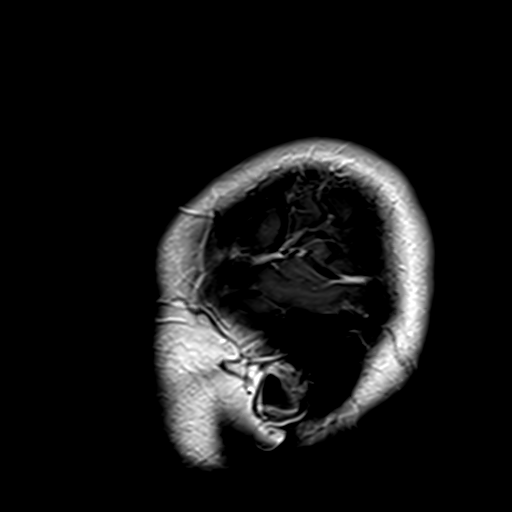

[48 of 48 positions shown; findings below may reference images not displayed]

FINDINGS: Brain: The mild, relatively subtle asymmetric FLAIR hyperintensity
in the left putamen (series 8, image 16 today) is stable since
[REDACTED], with subtle associated T2 hyperintensity not associated with
signal changes on other pre contrast imaging (including DWI which
appears to be within normal limits), and as before does not enhance.

Elsewhere occasional areas of artifactual increased FLAIR signal are
also noted and stable, including in the left cerebellum and mesial
left temporal lobe on series 8, image 8 (related to CSF pulsation
artifact seen in the left CP angle on that slice). Gray and white
matter signal outside of left putamen is normal.

No chronic cerebral blood products. No restricted diffusion to
suggest acute infarction. No midline shift, mass effect, evidence of
mass lesion, ventriculomegaly, extra-axial collection or acute
intracranial hemorrhage. Cervicomedullary junction and pituitary are
within normal limits.

No abnormal enhancement identified.  No dural thickening.

Vascular: Major intracranial vascular flow voids are preserved and
stable. The major dural venous sinuses are enhancing and appear to
be patent.

Skull and upper cervical spine: Negative visible cervical spine,
bone marrow signal.

Sinuses/Orbits: Stable, negative.

Other: Mastoids remain clear. Visible internal auditory structures
appear normal. Visible scalp and face soft tissues appear negative.
IMPRESSION: 1. Unchanged mild asymmetric FLAIR/T2 hyperintensity in the left
putamen since [REDACTED], with no enhancement or correlation on other
sequences.
This remains nonspecific but benign etiology is favored. Given 4
months of stability, surveillance MRI can be obtained again in
[DATE] (assuming no new symptomatology) and in an effort to
practice judicious use of contrast - consider noncontrast follow-up
MRI unless the lesion is found to enlarge.

2. Elsewhere stable and normal MRI appearance of the brain.

## 2020-10-11 MED ORDER — GADOBENATE DIMEGLUMINE 529 MG/ML IV SOLN
15.0000 mL | Freq: Once | INTRAVENOUS | Status: AC | PRN
  Administered 2020-10-11: 15 mL via INTRAVENOUS

## 2020-10-16 ENCOUNTER — Other Ambulatory Visit (HOSPITAL_COMMUNITY): Payer: Self-pay | Admitting: Internal Medicine

## 2020-10-16 DIAGNOSIS — F19980 Other psychoactive substance use, unspecified with psychoactive substance-induced anxiety disorder: Secondary | ICD-10-CM | POA: Diagnosis not present

## 2020-10-16 DIAGNOSIS — Z6823 Body mass index (BMI) 23.0-23.9, adult: Secondary | ICD-10-CM | POA: Diagnosis not present

## 2020-10-16 DIAGNOSIS — M1990 Unspecified osteoarthritis, unspecified site: Secondary | ICD-10-CM | POA: Diagnosis not present

## 2020-10-16 DIAGNOSIS — F1120 Opioid dependence, uncomplicated: Secondary | ICD-10-CM | POA: Diagnosis not present

## 2020-10-16 DIAGNOSIS — F988 Other specified behavioral and emotional disorders with onset usually occurring in childhood and adolescence: Secondary | ICD-10-CM | POA: Diagnosis not present

## 2020-10-16 DIAGNOSIS — F1920 Other psychoactive substance dependence, uncomplicated: Secondary | ICD-10-CM | POA: Diagnosis not present

## 2020-10-16 MED FILL — BUPRENORPHINE HCL-NALOXONE: 8-2 | 28 days supply | Qty: 21 | Fill #0

## 2020-11-13 ENCOUNTER — Other Ambulatory Visit (HOSPITAL_COMMUNITY): Payer: Self-pay | Admitting: Internal Medicine

## 2020-11-13 DIAGNOSIS — F19980 Other psychoactive substance use, unspecified with psychoactive substance-induced anxiety disorder: Secondary | ICD-10-CM | POA: Diagnosis not present

## 2020-11-13 DIAGNOSIS — F1120 Opioid dependence, uncomplicated: Secondary | ICD-10-CM | POA: Diagnosis not present

## 2020-11-13 DIAGNOSIS — F988 Other specified behavioral and emotional disorders with onset usually occurring in childhood and adolescence: Secondary | ICD-10-CM | POA: Diagnosis not present

## 2020-11-13 DIAGNOSIS — Z6823 Body mass index (BMI) 23.0-23.9, adult: Secondary | ICD-10-CM | POA: Diagnosis not present

## 2020-11-13 DIAGNOSIS — M1990 Unspecified osteoarthritis, unspecified site: Secondary | ICD-10-CM | POA: Diagnosis not present

## 2020-11-13 DIAGNOSIS — F1920 Other psychoactive substance dependence, uncomplicated: Secondary | ICD-10-CM | POA: Diagnosis not present

## 2020-11-13 MED FILL — BUPRENORPHINE HCL-NALOXONE: 8-2 | 28 days supply | Qty: 21 | Fill #0

## 2020-12-11 ENCOUNTER — Other Ambulatory Visit (HOSPITAL_COMMUNITY): Payer: Self-pay | Admitting: Internal Medicine

## 2020-12-11 DIAGNOSIS — M1990 Unspecified osteoarthritis, unspecified site: Secondary | ICD-10-CM | POA: Diagnosis not present

## 2020-12-11 DIAGNOSIS — F988 Other specified behavioral and emotional disorders with onset usually occurring in childhood and adolescence: Secondary | ICD-10-CM | POA: Diagnosis not present

## 2020-12-11 DIAGNOSIS — F1120 Opioid dependence, uncomplicated: Secondary | ICD-10-CM | POA: Diagnosis not present

## 2020-12-11 DIAGNOSIS — F19980 Other psychoactive substance use, unspecified with psychoactive substance-induced anxiety disorder: Secondary | ICD-10-CM | POA: Diagnosis not present

## 2020-12-11 DIAGNOSIS — F1920 Other psychoactive substance dependence, uncomplicated: Secondary | ICD-10-CM | POA: Diagnosis not present

## 2020-12-11 DIAGNOSIS — Z6823 Body mass index (BMI) 23.0-23.9, adult: Secondary | ICD-10-CM | POA: Diagnosis not present

## 2020-12-11 MED FILL — BUPRENORPHINE HCL-NALOXONE: 8-2 | 28 days supply | Qty: 21 | Fill #0

## 2021-01-08 ENCOUNTER — Other Ambulatory Visit (HOSPITAL_COMMUNITY): Payer: Self-pay | Admitting: Internal Medicine

## 2021-01-08 DIAGNOSIS — F1920 Other psychoactive substance dependence, uncomplicated: Secondary | ICD-10-CM | POA: Diagnosis not present

## 2021-01-08 DIAGNOSIS — F1120 Opioid dependence, uncomplicated: Secondary | ICD-10-CM | POA: Diagnosis not present

## 2021-01-08 DIAGNOSIS — F19980 Other psychoactive substance use, unspecified with psychoactive substance-induced anxiety disorder: Secondary | ICD-10-CM | POA: Diagnosis not present

## 2021-01-08 DIAGNOSIS — M1990 Unspecified osteoarthritis, unspecified site: Secondary | ICD-10-CM | POA: Diagnosis not present

## 2021-01-08 DIAGNOSIS — F988 Other specified behavioral and emotional disorders with onset usually occurring in childhood and adolescence: Secondary | ICD-10-CM | POA: Diagnosis not present

## 2021-01-08 DIAGNOSIS — Z6822 Body mass index (BMI) 22.0-22.9, adult: Secondary | ICD-10-CM | POA: Diagnosis not present

## 2021-02-05 ENCOUNTER — Other Ambulatory Visit (HOSPITAL_COMMUNITY): Payer: Self-pay

## 2021-02-05 DIAGNOSIS — F988 Other specified behavioral and emotional disorders with onset usually occurring in childhood and adolescence: Secondary | ICD-10-CM | POA: Diagnosis not present

## 2021-02-05 DIAGNOSIS — M1990 Unspecified osteoarthritis, unspecified site: Secondary | ICD-10-CM | POA: Diagnosis not present

## 2021-02-05 DIAGNOSIS — F1120 Opioid dependence, uncomplicated: Secondary | ICD-10-CM | POA: Diagnosis not present

## 2021-02-05 DIAGNOSIS — F19980 Other psychoactive substance use, unspecified with psychoactive substance-induced anxiety disorder: Secondary | ICD-10-CM | POA: Diagnosis not present

## 2021-02-05 DIAGNOSIS — F1920 Other psychoactive substance dependence, uncomplicated: Secondary | ICD-10-CM | POA: Diagnosis not present

## 2021-02-05 DIAGNOSIS — Z6822 Body mass index (BMI) 22.0-22.9, adult: Secondary | ICD-10-CM | POA: Diagnosis not present

## 2021-02-05 MED ORDER — BUPRENORPHINE HCL-NALOXONE HCL 8-2 MG SL FILM
ORAL_FILM | SUBLINGUAL | 0 refills | Status: DC
  Filled 2021-02-05: qty 21, 28d supply, fill #0

## 2021-03-05 ENCOUNTER — Other Ambulatory Visit (HOSPITAL_COMMUNITY): Payer: Self-pay

## 2021-03-05 DIAGNOSIS — Z6822 Body mass index (BMI) 22.0-22.9, adult: Secondary | ICD-10-CM | POA: Diagnosis not present

## 2021-03-05 DIAGNOSIS — F1920 Other psychoactive substance dependence, uncomplicated: Secondary | ICD-10-CM | POA: Diagnosis not present

## 2021-03-05 DIAGNOSIS — F19980 Other psychoactive substance use, unspecified with psychoactive substance-induced anxiety disorder: Secondary | ICD-10-CM | POA: Diagnosis not present

## 2021-03-05 DIAGNOSIS — M1990 Unspecified osteoarthritis, unspecified site: Secondary | ICD-10-CM | POA: Diagnosis not present

## 2021-03-05 DIAGNOSIS — F1120 Opioid dependence, uncomplicated: Secondary | ICD-10-CM | POA: Diagnosis not present

## 2021-03-05 DIAGNOSIS — F988 Other specified behavioral and emotional disorders with onset usually occurring in childhood and adolescence: Secondary | ICD-10-CM | POA: Diagnosis not present

## 2021-03-05 MED ORDER — BUPRENORPHINE HCL-NALOXONE HCL 8-2 MG SL FILM
ORAL_FILM | SUBLINGUAL | 0 refills | Status: DC
  Filled 2021-03-05: qty 21, 28d supply, fill #0

## 2021-03-06 ENCOUNTER — Other Ambulatory Visit (HOSPITAL_COMMUNITY): Payer: Self-pay

## 2021-04-02 ENCOUNTER — Other Ambulatory Visit (HOSPITAL_COMMUNITY): Payer: Self-pay

## 2021-04-02 DIAGNOSIS — Z6822 Body mass index (BMI) 22.0-22.9, adult: Secondary | ICD-10-CM | POA: Diagnosis not present

## 2021-04-02 DIAGNOSIS — F1920 Other psychoactive substance dependence, uncomplicated: Secondary | ICD-10-CM | POA: Diagnosis not present

## 2021-04-02 DIAGNOSIS — F1120 Opioid dependence, uncomplicated: Secondary | ICD-10-CM | POA: Diagnosis not present

## 2021-04-02 DIAGNOSIS — M1990 Unspecified osteoarthritis, unspecified site: Secondary | ICD-10-CM | POA: Diagnosis not present

## 2021-04-02 DIAGNOSIS — F988 Other specified behavioral and emotional disorders with onset usually occurring in childhood and adolescence: Secondary | ICD-10-CM | POA: Diagnosis not present

## 2021-04-02 DIAGNOSIS — F19980 Other psychoactive substance use, unspecified with psychoactive substance-induced anxiety disorder: Secondary | ICD-10-CM | POA: Diagnosis not present

## 2021-04-02 MED ORDER — BUPRENORPHINE HCL-NALOXONE HCL 8-2 MG SL FILM
ORAL_FILM | SUBLINGUAL | 0 refills | Status: DC
  Filled 2021-04-02: qty 21, 28d supply, fill #0

## 2021-04-30 ENCOUNTER — Other Ambulatory Visit (HOSPITAL_COMMUNITY): Payer: Self-pay

## 2021-04-30 DIAGNOSIS — F1120 Opioid dependence, uncomplicated: Secondary | ICD-10-CM | POA: Diagnosis not present

## 2021-04-30 DIAGNOSIS — Z6822 Body mass index (BMI) 22.0-22.9, adult: Secondary | ICD-10-CM | POA: Diagnosis not present

## 2021-04-30 DIAGNOSIS — F19980 Other psychoactive substance use, unspecified with psychoactive substance-induced anxiety disorder: Secondary | ICD-10-CM | POA: Diagnosis not present

## 2021-04-30 DIAGNOSIS — F1920 Other psychoactive substance dependence, uncomplicated: Secondary | ICD-10-CM | POA: Diagnosis not present

## 2021-04-30 DIAGNOSIS — M1990 Unspecified osteoarthritis, unspecified site: Secondary | ICD-10-CM | POA: Diagnosis not present

## 2021-04-30 DIAGNOSIS — F988 Other specified behavioral and emotional disorders with onset usually occurring in childhood and adolescence: Secondary | ICD-10-CM | POA: Diagnosis not present

## 2021-04-30 MED ORDER — BUPRENORPHINE HCL-NALOXONE HCL 8-2 MG SL FILM
ORAL_FILM | SUBLINGUAL | 0 refills | Status: DC
  Filled 2021-04-30: qty 21, 28d supply, fill #0

## 2021-05-26 ENCOUNTER — Telehealth: Payer: 59 | Admitting: Physician Assistant

## 2021-05-26 ENCOUNTER — Encounter: Payer: Self-pay | Admitting: Physician Assistant

## 2021-05-26 DIAGNOSIS — H6121 Impacted cerumen, right ear: Secondary | ICD-10-CM

## 2021-05-26 NOTE — Progress Notes (Signed)
Virtual Visit Consent   Ryan Spencer, you are scheduled for a virtual visit with a [facility] provider today.     Just as with appointments in the office, your consent must be obtained to participate.  Your consent will be active for this visit and any virtual visit you may have with one of our providers in the next 365 days.     If you have a MyChart account, a copy of this consent can be sent to you electronically.  All virtual visits are billed to your insurance company just like a traditional visit in the office.    As this is a virtual visit, video technology does not allow for your provider to perform a traditional examination.  This may limit your provider's ability to fully assess your condition.  If your provider identifies any concerns that need to be evaluated in person or the need to arrange testing (such as labs, EKG, etc.), we will make arrangements to do so.     Although advances in technology are sophisticated, we cannot ensure that it will always work on either your end or our end.  If the connection with a video visit is poor, the visit may have to be switched to a telephone visit.  With either a video or telephone visit, we are not always able to ensure that we have a secure connection.     I need to obtain your verbal consent now.   Are you willing to proceed with your visit today?    Ryan Spencer has provided verbal consent on 05/26/2021 for a virtual visit (video or telephone).   Margaretann Loveless, PA-C   Date: 05/26/2021 4:24 PM   Virtual Visit via Video Note   I, Margaretann Loveless, connected with  Ryan Spencer  ([id number], 10-Jul-1986) on 05/26/21 at  4:15 PM EDT by a video-enabled telemedicine application and verified that I am speaking with the correct person using two identifiers.  Location: Patient: Virtual Visit Location Patient: Home Provider: Virtual Visit Location Provider: Home Office   I discussed the limitations of evaluation and management  by telemedicine and the availability of in person appointments. The patient expressed understanding and agreed to proceed.    History of Present Illness: Ryan Spencer is a 35 y.o. who identifies as a male who was assigned male at birth, and is being seen today for right ear muffled hearing and mild tinnitus. Had muffled hearing and ringing in the same ear about a week ago that lasted until lunch, but improved. No pain.No discharge. No sinus or URI symptoms. No fever. No dizziness or lightheadedness.    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/23/2020   Other peripheral vertigo, unspecified ear 05/17/2020   Routine general medical examination at a health care facility 05/17/2020   Vertigo, benign paroxysmal, bilateral 05/13/2020   Tingling of both upper extremities 05/13/2020   Chronic hyperglycemia 05/13/2020   ADD (attention deficit disorder) 08/14/2015   TOBACCO ABUSE 11/19/2008   Arthralgia 08/07/2008    Allergies:  Allergies  Allergen Reactions   Tramadol Other (See Comments)    Headache   Medications:  Current Outpatient Medications:    Buprenorphine HCl-Naloxone HCl (SUBOXONE) 8-2 MG FILM, Place 1/4 film under the tongue 3 times a day, Disp: 21 each, Rfl: 0   Buprenorphine HCl-Naloxone HCl 8-2 MG FILM, , Disp: , Rfl:  Buprenorphine HCl-Naloxone HCl 8-2 MG FILM, DISSOLVE 1/4 FILM UNDER THE SKIN 3 TIMES DAILY, Disp: 21 each, Rfl: 0   Buprenorphine HCl-Naloxone HCl 8-2 MG FILM, DISSOLVE 1/4 FILM UNDER TONGUE THREE TIMES DAILY, Disp: 21 each, Rfl: 0   scopolamine (TRANSDERM-SCOP) 1 MG/3DAYS, Place 1 patch (1.5 mg total) onto the skin every 3 (three) days., Disp: 10 patch, Rfl: 2  Observations/Objective: Patient is well-developed, well-nourished in no acute distress.  Resting comfortably  at home.  Head is normocephalic, atraumatic.  No labored breathing.   Speech is clear and coherent with logical content.  Patient is alert and oriented at baseline.    Assessment and Plan: 1. Impacted cerumen of right ear - Since there are no other symptoms, highly suspicious for cerumen impaction. - Advised Debrox drops OTC - Follow up with PCP or UC if unable to clear with OTC treatments  Follow Up Instructions: I discussed the assessment and treatment plan with the patient. The patient was provided an opportunity to ask questions and all were answered. The patient agreed with the plan and demonstrated an understanding of the instructions.  A copy of instructions were sent to the patient via MyChart.  The patient was advised to call back or seek an in-person evaluation if the symptoms worsen or if the condition fails to improve as anticipated.  Time:  I spent 10 minutes with the patient via telehealth technology discussing the above problems/concerns.    Margaretann Loveless, PA-C

## 2021-05-26 NOTE — Patient Instructions (Signed)
Ryan Spencer, thank you for joining Margaretann Loveless, PA-C for today's virtual visit.  While this provider is not your primary care provider (PCP), if your PCP is located in our provider database this encounter information will be shared with them immediately following your visit.  Consent: (Patient) Ryan Spencer provided verbal consent for this virtual visit at the beginning of the encounter.  Current Medications:  Current Outpatient Medications:    Buprenorphine HCl-Naloxone HCl (SUBOXONE) 8-2 MG FILM, Place 1/4 film under the tongue 3 times a day, Disp: 21 each, Rfl: 0   Buprenorphine HCl-Naloxone HCl 8-2 MG FILM, , Disp: , Rfl:    Buprenorphine HCl-Naloxone HCl 8-2 MG FILM, DISSOLVE 1/4 FILM UNDER THE SKIN 3 TIMES DAILY, Disp: 21 each, Rfl: 0   Buprenorphine HCl-Naloxone HCl 8-2 MG FILM, DISSOLVE 1/4 FILM UNDER TONGUE THREE TIMES DAILY, Disp: 21 each, Rfl: 0   scopolamine (TRANSDERM-SCOP) 1 MG/3DAYS, Place 1 patch (1.5 mg total) onto the skin every 3 (three) days., Disp: 10 patch, Rfl: 2   Medications ordered in this encounter:  No orders of the defined types were placed in this encounter.    *If you need refills on other medications prior to your next appointment, please contact your pharmacy*  Follow-Up: Call back or seek an in-person evaluation if the symptoms worsen or if the condition fails to improve as anticipated.  Other Instructions Debrox drops over the counter    If you have been instructed to have an in-person evaluation today at a local Urgent Care facility, please use the link below. It will take you to a list of all of our available [facility], including address, phone number and hours of operation. Please do not delay care.  [facility]  If you or a family member do not have a primary care provider, use the link below to schedule a visit and establish care. When you choose a [facility] primary care physician or advanced  practice provider, you gain a long-term partner in health. Find a Primary Care Provider  Learn more about [facility]'s in-office and virtual care options: [facility] - Get Care Now   Earwax Buildup, Adult The ears produce a substance called earwax that helps keep bacteria out of the ear and protects the skin in the ear canal. Occasionally, earwax can build upin the ear and cause discomfort or hearing loss. What are the causes? This condition is caused by a buildup of earwax. Ear canals are self-cleaning. Ear wax is made in the outer part of the ear canal and generally falls out insmall amounts over time. When the self-cleaning mechanism is not working, earwax builds up and can cause decreased hearing and discomfort. Attempting to clean ears with cotton swabs can push the earwax deep into the ear canal and cause decreased hearing andpain. What increases the risk? This condition is more likely to develop in people who: Clean their ears often with cotton swabs. Pick at their ears. Use earplugs or in-ear headphones often, or wear hearing aids. The following factors may also make you more likely to develop this condition: Being male. Being of older age. Naturally producing more earwax. Having narrow ear canals. Having earwax that is overly thick or sticky. Having excess hair in the ear canal. Having eczema. Being dehydrated. What are the signs or symptoms? Symptoms of this condition include: Reduced or muffled hearing. A feeling of fullness in the ear or feeling that the ear is plugged. Fluid  coming from the ear. Ear pain or an itchy ear. Ringing in the ear. Coughing. Balance problems. An obvious piece of earwax that can be seen inside the ear canal. How is this diagnosed? This condition may be diagnosed based on: Your symptoms. Your medical history. An ear exam. During the exam, your health care provider will look into your ear with an instrument called an otoscope. You may  have tests, including a hearing test. How is this treated? This condition may be treated by: Using ear drops to soften the earwax. Having the earwax removed by a health care provider. The health care provider may: Flush the ear with water. Use an instrument that has a loop on the end (curette). Use a suction device. Having surgery to remove the wax buildup. This may be done in severe cases. Follow these instructions at home:  Take over-the-counter and prescription medicines only as told by your health care provider. Do not put any objects, including cotton swabs, into your ear. You can clean the opening of your ear canal with a washcloth or facial tissue. Follow instructions from your health care provider about cleaning your ears. Do not overclean your ears. Drink enough fluid to keep your urine pale yellow. This will help to thin the earwax. Keep all follow-up visits as told. If earwax builds up in your ears often or if you use hearing aids, consider seeing your health care provider for routine, preventive ear cleanings. Ask your health care provider how often you should schedule your cleanings. If you have hearing aids, clean them according to instructions from the manufacturer and your health care provider. Contact a health care provider if: You have ear pain. You develop a fever. You have pus or other fluid coming from your ear. You have hearing loss. You have ringing in your ears that does not go away. You feel like the room is spinning (vertigo). Your symptoms do not improve with treatment. Get help right away if: You have bleeding from the affected ear. You have severe ear pain. Summary Earwax can build up in the ear and cause discomfort or hearing loss. The most common symptoms of this condition include reduced or muffled hearing, a feeling of fullness in the ear, or feeling that the ear is plugged. This condition may be diagnosed based on your symptoms, your medical history,  and an ear exam. This condition may be treated by using ear drops to soften the earwax or by having the earwax removed by a health care provider. Do not put any objects, including cotton swabs, into your ear. You can clean the opening of your ear canal with a washcloth or facial tissue. This information is not intended to replace advice given to you by your health care provider. Make sure you discuss any questions you have with your healthcare provider. Document Revised: 01/30/2020 Document Reviewed: 01/30/2020 Elsevier Patient Education  2022 [employer].

## 2021-05-27 DIAGNOSIS — H6690 Otitis media, unspecified, unspecified ear: Secondary | ICD-10-CM | POA: Diagnosis not present

## 2021-05-27 DIAGNOSIS — H6691 Otitis media, unspecified, right ear: Secondary | ICD-10-CM | POA: Diagnosis not present

## 2021-05-28 ENCOUNTER — Other Ambulatory Visit (HOSPITAL_COMMUNITY): Payer: Self-pay

## 2021-05-28 DIAGNOSIS — F19980 Other psychoactive substance use, unspecified with psychoactive substance-induced anxiety disorder: Secondary | ICD-10-CM | POA: Diagnosis not present

## 2021-05-28 DIAGNOSIS — F988 Other specified behavioral and emotional disorders with onset usually occurring in childhood and adolescence: Secondary | ICD-10-CM | POA: Diagnosis not present

## 2021-05-28 DIAGNOSIS — M1990 Unspecified osteoarthritis, unspecified site: Secondary | ICD-10-CM | POA: Diagnosis not present

## 2021-05-28 DIAGNOSIS — F1120 Opioid dependence, uncomplicated: Secondary | ICD-10-CM | POA: Diagnosis not present

## 2021-05-28 DIAGNOSIS — Z6822 Body mass index (BMI) 22.0-22.9, adult: Secondary | ICD-10-CM | POA: Diagnosis not present

## 2021-05-28 DIAGNOSIS — F1920 Other psychoactive substance dependence, uncomplicated: Secondary | ICD-10-CM | POA: Diagnosis not present

## 2021-05-28 MED ORDER — AMOXICILLIN 500 MG PO CAPS
500.0000 mg | ORAL_CAPSULE | Freq: Two times a day (BID) | ORAL | 0 refills | Status: DC
  Filled 2021-05-28: qty 20, 10d supply, fill #0

## 2021-05-28 MED ORDER — BUPRENORPHINE HCL-NALOXONE HCL 8-2 MG SL FILM
ORAL_FILM | SUBLINGUAL | 0 refills | Status: DC
  Filled 2021-05-28: qty 21, 28d supply, fill #0

## 2021-06-16 ENCOUNTER — Telehealth: Payer: Self-pay | Admitting: Internal Medicine

## 2021-06-16 ENCOUNTER — Other Ambulatory Visit: Payer: Self-pay | Admitting: Internal Medicine

## 2021-06-16 DIAGNOSIS — R202 Paresthesia of skin: Secondary | ICD-10-CM

## 2021-06-16 DIAGNOSIS — R9089 Other abnormal findings on diagnostic imaging of central nervous system: Secondary | ICD-10-CM

## 2021-06-16 NOTE — Telephone Encounter (Signed)
-----   Message from Etta Grandchild, MD sent at 10/11/2020 10:30 AM EST ----- Regarding: MRI brain recheck

## 2021-06-25 ENCOUNTER — Other Ambulatory Visit (HOSPITAL_COMMUNITY): Payer: Self-pay

## 2021-06-25 DIAGNOSIS — F988 Other specified behavioral and emotional disorders with onset usually occurring in childhood and adolescence: Secondary | ICD-10-CM | POA: Diagnosis not present

## 2021-06-25 DIAGNOSIS — F1920 Other psychoactive substance dependence, uncomplicated: Secondary | ICD-10-CM | POA: Diagnosis not present

## 2021-06-25 DIAGNOSIS — F1120 Opioid dependence, uncomplicated: Secondary | ICD-10-CM | POA: Diagnosis not present

## 2021-06-25 DIAGNOSIS — M1990 Unspecified osteoarthritis, unspecified site: Secondary | ICD-10-CM | POA: Diagnosis not present

## 2021-06-25 DIAGNOSIS — F19980 Other psychoactive substance use, unspecified with psychoactive substance-induced anxiety disorder: Secondary | ICD-10-CM | POA: Diagnosis not present

## 2021-06-25 DIAGNOSIS — Z6822 Body mass index (BMI) 22.0-22.9, adult: Secondary | ICD-10-CM | POA: Diagnosis not present

## 2021-06-25 MED ORDER — BUPRENORPHINE HCL-NALOXONE HCL 8-2 MG SL FILM
ORAL_FILM | SUBLINGUAL | 0 refills | Status: DC
  Filled 2021-06-25: qty 21, 28d supply, fill #0

## 2021-07-05 ENCOUNTER — Other Ambulatory Visit: Payer: Self-pay

## 2021-07-05 ENCOUNTER — Other Ambulatory Visit: Payer: 59

## 2021-07-05 ENCOUNTER — Ambulatory Visit
Admission: RE | Admit: 2021-07-05 | Discharge: 2021-07-05 | Disposition: A | Discharge status: Home / Self Care | Payer: 59 | Source: Ambulatory Visit | Attending: Internal Medicine | Admitting: Internal Medicine

## 2021-07-05 DIAGNOSIS — R202 Paresthesia of skin: Secondary | ICD-10-CM

## 2021-07-05 DIAGNOSIS — R9089 Other abnormal findings on diagnostic imaging of central nervous system: Secondary | ICD-10-CM

## 2021-07-05 DIAGNOSIS — R42 Dizziness and giddiness: Secondary | ICD-10-CM | POA: Diagnosis not present

## 2021-07-05 IMAGING — MR MR HEAD WO/W CM
13 series · 48 of 48 positions shown · IV contrast (15 ml multihance)
Comparison: Brain MRI [DATE], [DATE].

CLINICAL DATA: 34-year-old male with asymmetric T2 signal in the
left basal ganglia on MRI last year, initially performed for vertigo
and hand numbness/tingling. Subsequent encounter.

EXAM:
MRI HEAD WITHOUT AND WITH CONTRAST
TECHNIQUE: Multiplanar, multiecho pulse sequences of the brain and surrounding
structures were obtained without and with intravenous contrast.
CONTRAST:  15mL MULTIHANCE GADOBENATE DIMEGLUMINE 529 MG/ML IV SOLN

[Series 2: T1 · sagittal · 5.0mm · 0.45mm/px · 2 of 21 slices shown]
[im 1/21]
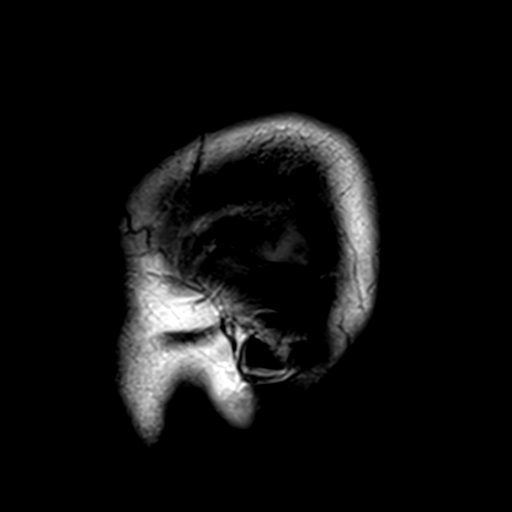
[im 21/21]
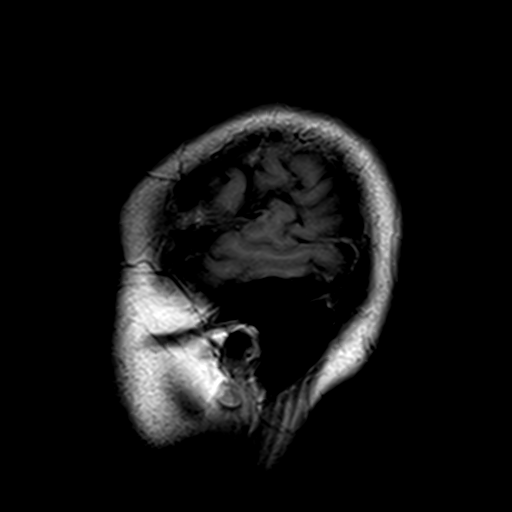

[Series 3: DWI · axial · 3.0mm · 1.80mm/px · z∈[-56,+90]mm · 8 of 100 slices shown (1 of 4)]
[im 1/100]
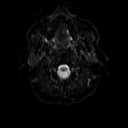
[im 15/100]
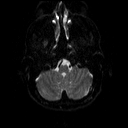
[im 29/100]
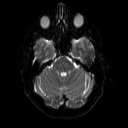
[im 43/100]
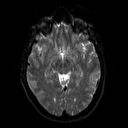
[im 57/100]
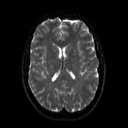
[im 71/100]
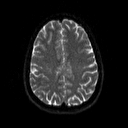
[im 85/100]
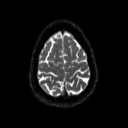
[im 100/100]
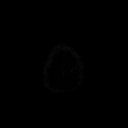

[Series 4: DWI · axial · 3.0mm · 1.80mm/px · z∈[-56,+90]mm · 3 of 46 slices shown (2 of 4)]
[im 1/46]
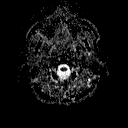
[im 23/46]
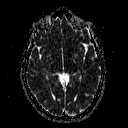
[im 46/46]
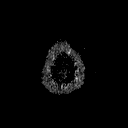

[Series 5: DWI · coronal · 5.0mm · 1.80mm/px · 4 of 68 slices shown (3 of 4)]
[im 1/68]
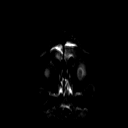
[im 23/68]
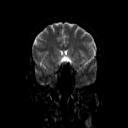
[im 45/68]
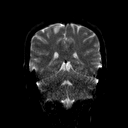
[im 68/68]
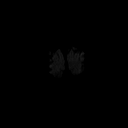

[Series 6: DWI · coronal · 5.0mm · 1.80mm/px · 2 of 34 slices shown (4 of 4)]
[im 1/34]
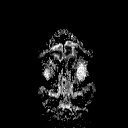
[im 34/34]
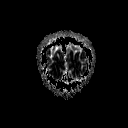

[Series 7: T2 · axial · 5.0mm · 0.60mm/px · 1 of 22 slices shown (1 of 2)]
[im 1/22]
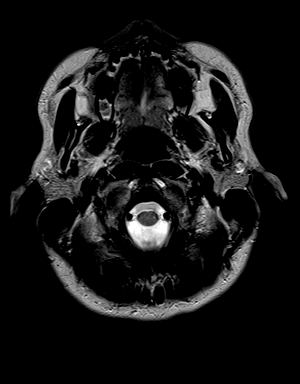

[Series 8: FLAIR · axial · 3.0mm · 0.45mm/px · z∈[-57,+91]mm · 2 of 33 slices shown]
[im 1/33]
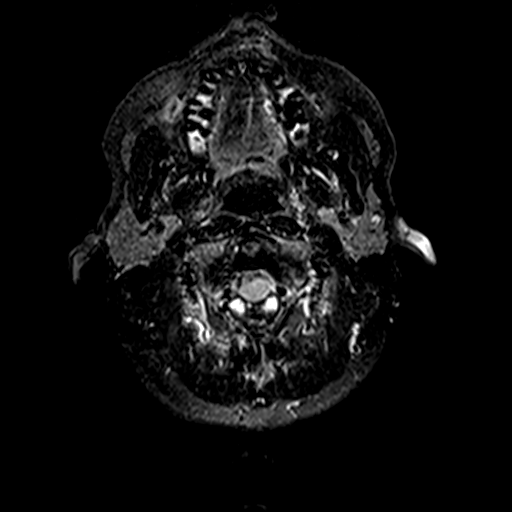
[im 33/33]
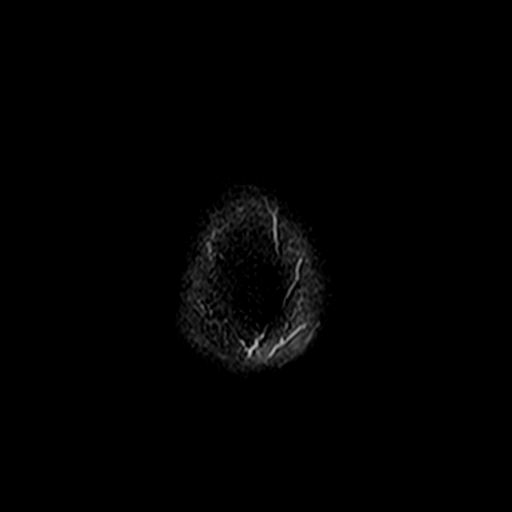

[Series 10: swi_images · axial · 4.0mm · 0.90mm/px · z∈[-60,+95]mm · 3 of 40 slices shown]
[im 1/40]
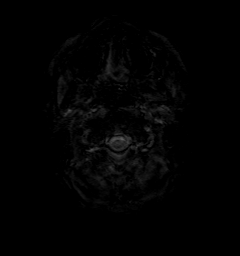
[im 20/40]
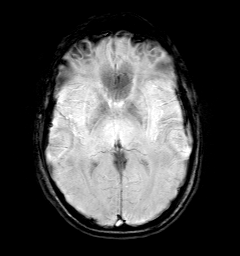
[im 40/40]
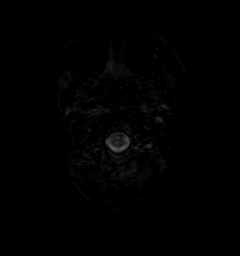

[Series 11: t1_mpr_tra · axial · 1.0mm · 0.75mm/px · z∈[-54,+88]mm · 9 of 144 slices shown]
[im 1/144]
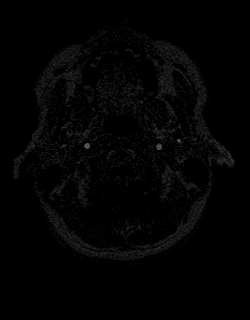
[im 18/144]
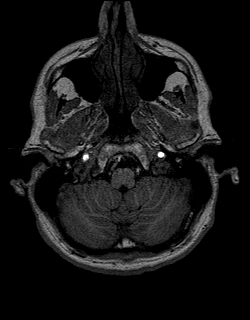
[im 36/144]
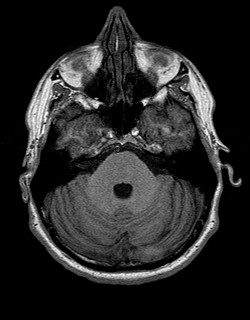
[im 54/144]
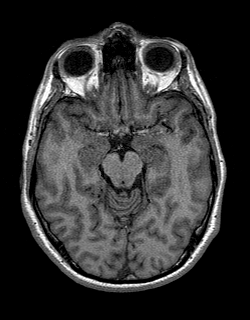
[im 72/144]
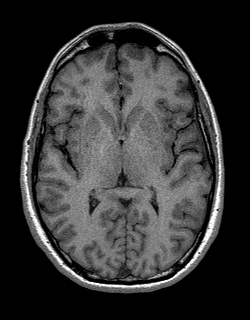
[im 90/144]
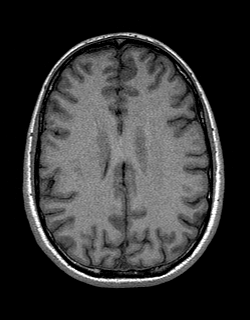
[im 108/144]
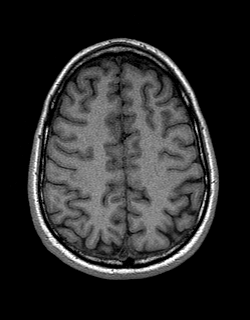
[im 126/144]
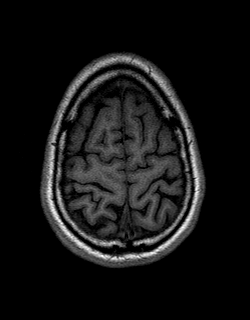
[im 144/144]
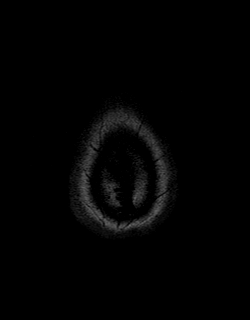

[Series 12: T2 · coronal · 5.0mm · 0.45mm/px · 2 of 28 slices shown (2 of 2)]
[im 1/28]
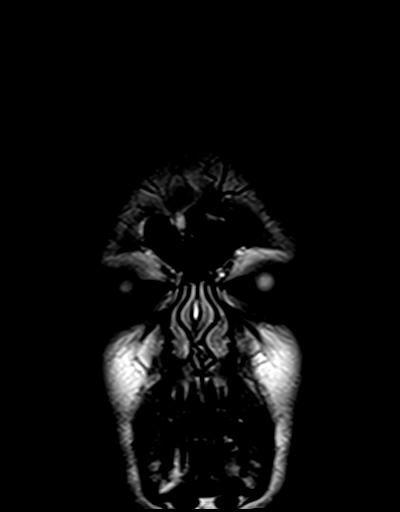
[im 28/28]
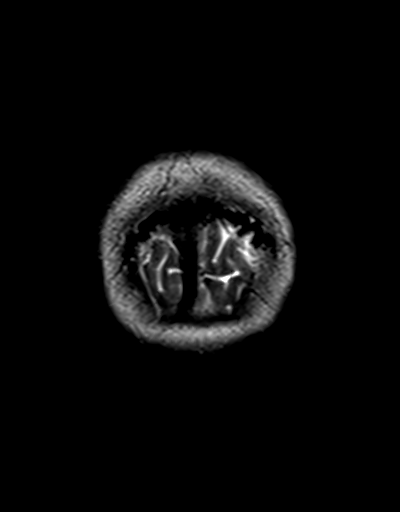

[Series 13: t1_mpr_tra post · axial · 1.0mm · 0.75mm/px · z∈[-54,+88]mm · 9 of 144 slices shown]
[im 1/144]
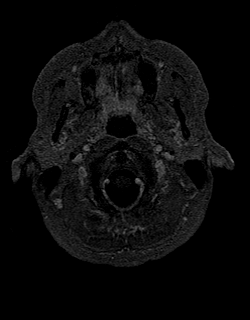
[im 18/144]
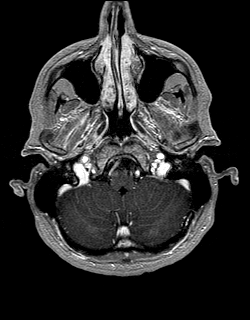
[im 36/144]
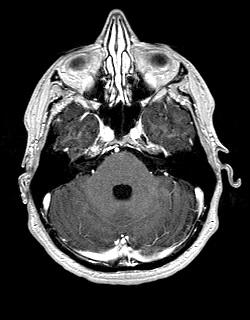
[im 54/144]
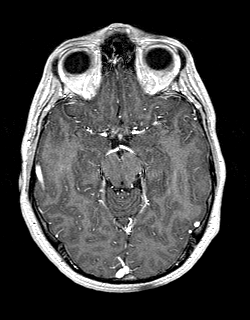
[im 72/144]
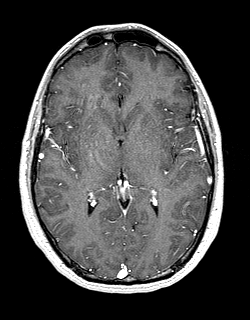
[im 90/144]
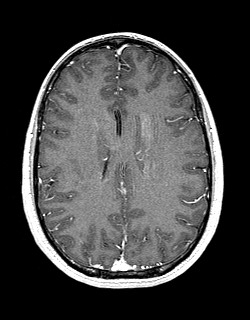
[im 108/144]
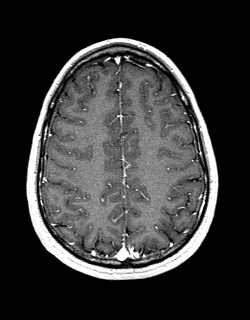
[im 126/144]
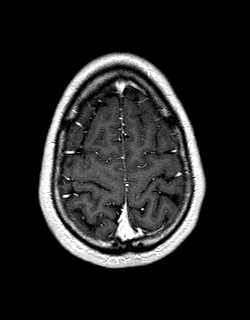
[im 144/144]
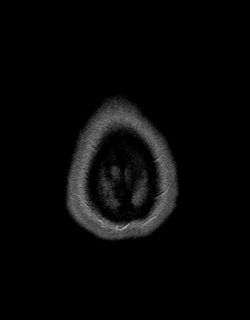

[Series 14: post cor · coronal · 5.0mm · 0.45mm/px · 2 of 28 slices shown]
[im 1/28]
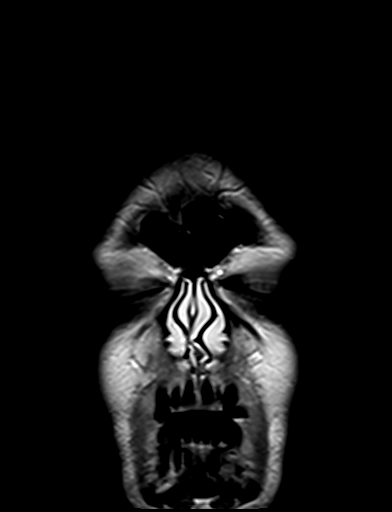
[im 28/28]
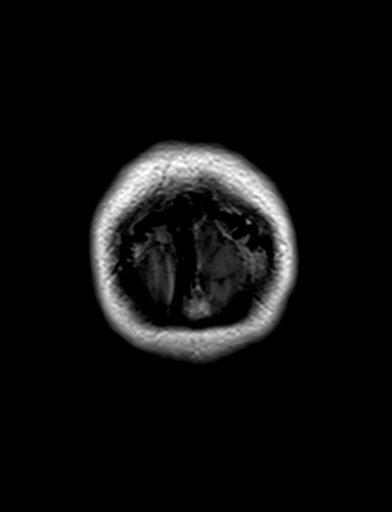

[Series 15: T1 post-contrast · sagittal · 5.0mm · 0.45mm/px · 1 of 21 slices shown]
[im 1/21]
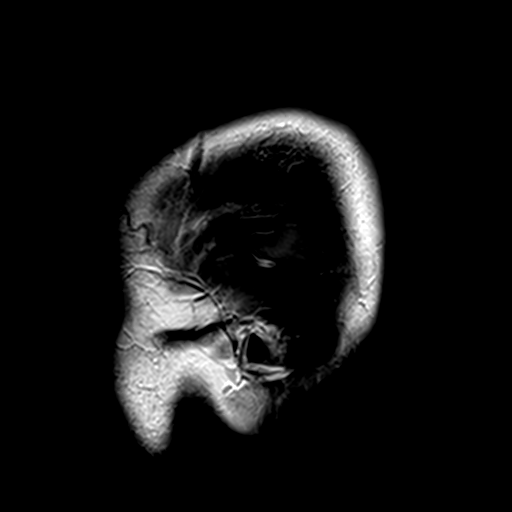

[48 of 48 positions shown; findings below may reference images not displayed]

FINDINGS: Brain: Biconvex roughly 17 mm area of asymmetric FLAIR
hyperintensity in the anterior left putamen (series 8, image 18)
remain stable since last year, and subtle on the non FLAIR
sequences. Diffusion is mildly facilitated. There is no regional
mass effect. No mineralization. No associated enhancement.

Stable and normal cerebral volume. No restricted diffusion to
suggest acute infarction. No midline shift, mass effect,
ventriculomegaly, extra-axial collection or acute intracranial
hemorrhage. Cervicomedullary junction and pituitary are within
normal limits. Outside of the left putamen gray and white matter
signal remains normal, with no cortical encephalomalacia or chronic
cerebral blood products identified.

No abnormal enhancement identified. No dural thickening.

Vascular: Major intracranial vascular flow voids are stable. And the
major dural venous sinuses are enhancing and appear to be patent.

Skull and upper cervical spine: Normal visible cervical spine, bone
marrow signal.

Sinuses/Orbits: Stable, negative.

Other: Mastoids are clear. Visible internal auditory structures
appear normal. Scalp and face soft tissues appear negative.
IMPRESSION: 1. Small 17 mm area of asymmetric FLAIR hyperintensity in the
anterior left putamen remains stable since [DATE].
No associated enhancement or mass effect, and this remains subtle on
non-FLAIR imaging.
Noncontrast imaging likely will suffice on subsequent surveillance,
unless the lesion is found to enlarge.

2. Otherwise normal MRI appearance of the brain.

## 2021-07-05 MED ORDER — GADOBENATE DIMEGLUMINE 529 MG/ML IV SOLN
15.0000 mL | Freq: Once | INTRAVENOUS | Status: AC | PRN
  Administered 2021-07-05: 15 mL via INTRAVENOUS

## 2021-07-23 ENCOUNTER — Other Ambulatory Visit (HOSPITAL_COMMUNITY): Payer: Self-pay

## 2021-07-23 DIAGNOSIS — Z6822 Body mass index (BMI) 22.0-22.9, adult: Secondary | ICD-10-CM | POA: Diagnosis not present

## 2021-07-23 DIAGNOSIS — F1120 Opioid dependence, uncomplicated: Secondary | ICD-10-CM | POA: Diagnosis not present

## 2021-07-23 DIAGNOSIS — M1990 Unspecified osteoarthritis, unspecified site: Secondary | ICD-10-CM | POA: Diagnosis not present

## 2021-07-23 DIAGNOSIS — F1920 Other psychoactive substance dependence, uncomplicated: Secondary | ICD-10-CM | POA: Diagnosis not present

## 2021-07-23 DIAGNOSIS — F988 Other specified behavioral and emotional disorders with onset usually occurring in childhood and adolescence: Secondary | ICD-10-CM | POA: Diagnosis not present

## 2021-07-23 DIAGNOSIS — F19980 Other psychoactive substance use, unspecified with psychoactive substance-induced anxiety disorder: Secondary | ICD-10-CM | POA: Diagnosis not present

## 2021-07-23 MED ORDER — BUPRENORPHINE HCL-NALOXONE HCL 8-2 MG SL FILM
ORAL_FILM | SUBLINGUAL | 0 refills | Status: DC
  Filled 2021-07-23: qty 21, 28d supply, fill #0

## 2021-08-20 ENCOUNTER — Other Ambulatory Visit (HOSPITAL_COMMUNITY): Payer: Self-pay

## 2021-08-20 DIAGNOSIS — F988 Other specified behavioral and emotional disorders with onset usually occurring in childhood and adolescence: Secondary | ICD-10-CM | POA: Diagnosis not present

## 2021-08-20 DIAGNOSIS — F1120 Opioid dependence, uncomplicated: Secondary | ICD-10-CM | POA: Diagnosis not present

## 2021-08-20 DIAGNOSIS — Z6822 Body mass index (BMI) 22.0-22.9, adult: Secondary | ICD-10-CM | POA: Diagnosis not present

## 2021-08-20 DIAGNOSIS — F19980 Other psychoactive substance use, unspecified with psychoactive substance-induced anxiety disorder: Secondary | ICD-10-CM | POA: Diagnosis not present

## 2021-08-20 DIAGNOSIS — F1920 Other psychoactive substance dependence, uncomplicated: Secondary | ICD-10-CM | POA: Diagnosis not present

## 2021-08-20 DIAGNOSIS — M1990 Unspecified osteoarthritis, unspecified site: Secondary | ICD-10-CM | POA: Diagnosis not present

## 2021-08-20 MED ORDER — BUPRENORPHINE HCL-NALOXONE HCL 8-2 MG SL FILM
ORAL_FILM | SUBLINGUAL | 0 refills | Status: DC
  Filled 2021-08-20: qty 21, 28d supply, fill #0

## 2021-09-04 ENCOUNTER — Other Ambulatory Visit (HOSPITAL_COMMUNITY): Payer: Self-pay

## 2021-09-04 MED ORDER — IBUPROFEN 600 MG PO TABS
600.0000 mg | ORAL_TABLET | Freq: Four times a day (QID) | ORAL | 0 refills | Status: DC | PRN
  Filled 2021-09-04: qty 30, 7d supply, fill #0

## 2021-09-04 MED ORDER — LORAZEPAM 1 MG PO TABS
ORAL_TABLET | ORAL | 0 refills | Status: DC
  Filled 2021-09-04: qty 2, 1d supply, fill #0

## 2021-09-04 MED ORDER — AMOXICILLIN 875 MG PO TABS
ORAL_TABLET | ORAL | 0 refills | Status: DC
  Filled 2021-09-04: qty 10, 5d supply, fill #0

## 2021-09-17 ENCOUNTER — Other Ambulatory Visit (HOSPITAL_COMMUNITY): Payer: Self-pay

## 2021-09-17 DIAGNOSIS — F1120 Opioid dependence, uncomplicated: Secondary | ICD-10-CM | POA: Diagnosis not present

## 2021-09-17 DIAGNOSIS — F988 Other specified behavioral and emotional disorders with onset usually occurring in childhood and adolescence: Secondary | ICD-10-CM | POA: Diagnosis not present

## 2021-09-17 DIAGNOSIS — Z6822 Body mass index (BMI) 22.0-22.9, adult: Secondary | ICD-10-CM | POA: Diagnosis not present

## 2021-09-17 DIAGNOSIS — F19980 Other psychoactive substance use, unspecified with psychoactive substance-induced anxiety disorder: Secondary | ICD-10-CM | POA: Diagnosis not present

## 2021-09-17 DIAGNOSIS — F1920 Other psychoactive substance dependence, uncomplicated: Secondary | ICD-10-CM | POA: Diagnosis not present

## 2021-09-17 DIAGNOSIS — M1990 Unspecified osteoarthritis, unspecified site: Secondary | ICD-10-CM | POA: Diagnosis not present

## 2021-09-17 MED ORDER — BUPRENORPHINE HCL-NALOXONE HCL 8-2 MG SL FILM
ORAL_FILM | SUBLINGUAL | 0 refills | Status: DC
  Filled 2021-09-17: qty 21, 28d supply, fill #0

## 2021-10-15 ENCOUNTER — Other Ambulatory Visit (HOSPITAL_COMMUNITY): Payer: Self-pay

## 2021-10-15 DIAGNOSIS — F1120 Opioid dependence, uncomplicated: Secondary | ICD-10-CM | POA: Diagnosis not present

## 2021-10-15 DIAGNOSIS — F19980 Other psychoactive substance use, unspecified with psychoactive substance-induced anxiety disorder: Secondary | ICD-10-CM | POA: Diagnosis not present

## 2021-10-15 DIAGNOSIS — F988 Other specified behavioral and emotional disorders with onset usually occurring in childhood and adolescence: Secondary | ICD-10-CM | POA: Diagnosis not present

## 2021-10-15 DIAGNOSIS — Z6822 Body mass index (BMI) 22.0-22.9, adult: Secondary | ICD-10-CM | POA: Diagnosis not present

## 2021-10-15 DIAGNOSIS — F1920 Other psychoactive substance dependence, uncomplicated: Secondary | ICD-10-CM | POA: Diagnosis not present

## 2021-10-15 DIAGNOSIS — M1990 Unspecified osteoarthritis, unspecified site: Secondary | ICD-10-CM | POA: Diagnosis not present

## 2021-10-15 MED ORDER — BUPRENORPHINE HCL-NALOXONE HCL 8-2 MG SL FILM
ORAL_FILM | SUBLINGUAL | 0 refills | Status: DC
  Filled 2021-10-15: qty 21, 28d supply, fill #0

## 2021-11-12 ENCOUNTER — Other Ambulatory Visit (HOSPITAL_COMMUNITY): Payer: Self-pay

## 2021-11-12 DIAGNOSIS — F19980 Other psychoactive substance use, unspecified with psychoactive substance-induced anxiety disorder: Secondary | ICD-10-CM | POA: Diagnosis not present

## 2021-11-12 DIAGNOSIS — F1120 Opioid dependence, uncomplicated: Secondary | ICD-10-CM | POA: Diagnosis not present

## 2021-11-12 DIAGNOSIS — Z6822 Body mass index (BMI) 22.0-22.9, adult: Secondary | ICD-10-CM | POA: Diagnosis not present

## 2021-11-12 DIAGNOSIS — F1920 Other psychoactive substance dependence, uncomplicated: Secondary | ICD-10-CM | POA: Diagnosis not present

## 2021-11-12 DIAGNOSIS — M1990 Unspecified osteoarthritis, unspecified site: Secondary | ICD-10-CM | POA: Diagnosis not present

## 2021-11-12 DIAGNOSIS — F988 Other specified behavioral and emotional disorders with onset usually occurring in childhood and adolescence: Secondary | ICD-10-CM | POA: Diagnosis not present

## 2021-11-12 MED ORDER — BUPRENORPHINE HCL-NALOXONE HCL 8-2 MG SL FILM
ORAL_FILM | SUBLINGUAL | 0 refills | Status: DC
  Filled 2021-11-12: qty 6, 8d supply, fill #0

## 2021-11-19 ENCOUNTER — Other Ambulatory Visit (HOSPITAL_COMMUNITY): Payer: Self-pay

## 2021-11-19 DIAGNOSIS — F1920 Other psychoactive substance dependence, uncomplicated: Secondary | ICD-10-CM | POA: Diagnosis not present

## 2021-11-19 DIAGNOSIS — Z6822 Body mass index (BMI) 22.0-22.9, adult: Secondary | ICD-10-CM | POA: Diagnosis not present

## 2021-11-19 DIAGNOSIS — F988 Other specified behavioral and emotional disorders with onset usually occurring in childhood and adolescence: Secondary | ICD-10-CM | POA: Diagnosis not present

## 2021-11-19 DIAGNOSIS — F19980 Other psychoactive substance use, unspecified with psychoactive substance-induced anxiety disorder: Secondary | ICD-10-CM | POA: Diagnosis not present

## 2021-11-19 DIAGNOSIS — M1990 Unspecified osteoarthritis, unspecified site: Secondary | ICD-10-CM | POA: Diagnosis not present

## 2021-11-19 DIAGNOSIS — F1120 Opioid dependence, uncomplicated: Secondary | ICD-10-CM | POA: Diagnosis not present

## 2021-11-19 MED ORDER — BUPRENORPHINE HCL-NALOXONE HCL 8-2 MG SL FILM
ORAL_FILM | SUBLINGUAL | 0 refills | Status: DC
  Filled 2021-11-19: qty 21, 28d supply, fill #0

## 2021-12-17 ENCOUNTER — Other Ambulatory Visit (HOSPITAL_COMMUNITY): Payer: Self-pay

## 2021-12-17 DIAGNOSIS — M1990 Unspecified osteoarthritis, unspecified site: Secondary | ICD-10-CM | POA: Diagnosis not present

## 2021-12-17 DIAGNOSIS — F988 Other specified behavioral and emotional disorders with onset usually occurring in childhood and adolescence: Secondary | ICD-10-CM | POA: Diagnosis not present

## 2021-12-17 DIAGNOSIS — F1120 Opioid dependence, uncomplicated: Secondary | ICD-10-CM | POA: Diagnosis not present

## 2021-12-17 DIAGNOSIS — F19980 Other psychoactive substance use, unspecified with psychoactive substance-induced anxiety disorder: Secondary | ICD-10-CM | POA: Diagnosis not present

## 2021-12-17 DIAGNOSIS — F1920 Other psychoactive substance dependence, uncomplicated: Secondary | ICD-10-CM | POA: Diagnosis not present

## 2021-12-17 DIAGNOSIS — Z6822 Body mass index (BMI) 22.0-22.9, adult: Secondary | ICD-10-CM | POA: Diagnosis not present

## 2021-12-17 MED ORDER — BUPRENORPHINE HCL-NALOXONE HCL 8-2 MG SL FILM
ORAL_FILM | SUBLINGUAL | 0 refills | Status: DC
  Filled 2021-12-17: qty 21, 28d supply, fill #0

## 2022-01-14 ENCOUNTER — Other Ambulatory Visit (HOSPITAL_COMMUNITY): Payer: Self-pay

## 2022-01-14 DIAGNOSIS — F1920 Other psychoactive substance dependence, uncomplicated: Secondary | ICD-10-CM | POA: Diagnosis not present

## 2022-01-14 DIAGNOSIS — Z6822 Body mass index (BMI) 22.0-22.9, adult: Secondary | ICD-10-CM | POA: Diagnosis not present

## 2022-01-14 DIAGNOSIS — F1120 Opioid dependence, uncomplicated: Secondary | ICD-10-CM | POA: Diagnosis not present

## 2022-01-14 DIAGNOSIS — M1990 Unspecified osteoarthritis, unspecified site: Secondary | ICD-10-CM | POA: Diagnosis not present

## 2022-01-14 DIAGNOSIS — F988 Other specified behavioral and emotional disorders with onset usually occurring in childhood and adolescence: Secondary | ICD-10-CM | POA: Diagnosis not present

## 2022-01-14 DIAGNOSIS — F19980 Other psychoactive substance use, unspecified with psychoactive substance-induced anxiety disorder: Secondary | ICD-10-CM | POA: Diagnosis not present

## 2022-01-14 MED ORDER — BUPRENORPHINE HCL-NALOXONE HCL 8-2 MG SL FILM
ORAL_FILM | SUBLINGUAL | 0 refills | Status: DC
  Filled 2022-01-14: qty 21, 28d supply, fill #0

## 2022-02-11 ENCOUNTER — Other Ambulatory Visit (HOSPITAL_COMMUNITY): Payer: Self-pay

## 2022-02-11 DIAGNOSIS — Z6822 Body mass index (BMI) 22.0-22.9, adult: Secondary | ICD-10-CM | POA: Diagnosis not present

## 2022-02-11 DIAGNOSIS — F1120 Opioid dependence, uncomplicated: Secondary | ICD-10-CM | POA: Diagnosis not present

## 2022-02-11 DIAGNOSIS — F988 Other specified behavioral and emotional disorders with onset usually occurring in childhood and adolescence: Secondary | ICD-10-CM | POA: Diagnosis not present

## 2022-02-11 DIAGNOSIS — F1920 Other psychoactive substance dependence, uncomplicated: Secondary | ICD-10-CM | POA: Diagnosis not present

## 2022-02-11 DIAGNOSIS — F19980 Other psychoactive substance use, unspecified with psychoactive substance-induced anxiety disorder: Secondary | ICD-10-CM | POA: Diagnosis not present

## 2022-02-11 DIAGNOSIS — M1990 Unspecified osteoarthritis, unspecified site: Secondary | ICD-10-CM | POA: Diagnosis not present

## 2022-02-11 MED ORDER — BUPRENORPHINE HCL-NALOXONE HCL 8-2 MG SL FILM
ORAL_FILM | SUBLINGUAL | 0 refills | Status: DC
  Filled 2022-02-11: qty 21, 28d supply, fill #0

## 2022-03-11 ENCOUNTER — Other Ambulatory Visit (HOSPITAL_COMMUNITY): Payer: Self-pay

## 2022-03-11 DIAGNOSIS — Z6822 Body mass index (BMI) 22.0-22.9, adult: Secondary | ICD-10-CM | POA: Diagnosis not present

## 2022-03-11 DIAGNOSIS — F1920 Other psychoactive substance dependence, uncomplicated: Secondary | ICD-10-CM | POA: Diagnosis not present

## 2022-03-11 DIAGNOSIS — M1990 Unspecified osteoarthritis, unspecified site: Secondary | ICD-10-CM | POA: Diagnosis not present

## 2022-03-11 DIAGNOSIS — F1120 Opioid dependence, uncomplicated: Secondary | ICD-10-CM | POA: Diagnosis not present

## 2022-03-11 DIAGNOSIS — F988 Other specified behavioral and emotional disorders with onset usually occurring in childhood and adolescence: Secondary | ICD-10-CM | POA: Diagnosis not present

## 2022-03-11 MED ORDER — BUPRENORPHINE HCL-NALOXONE HCL 8-2 MG SL FILM
ORAL_FILM | SUBLINGUAL | 0 refills | Status: DC
  Filled 2022-03-11: qty 21, 28d supply, fill #0

## 2022-04-08 ENCOUNTER — Other Ambulatory Visit (HOSPITAL_COMMUNITY): Payer: Self-pay

## 2022-04-08 DIAGNOSIS — Z6822 Body mass index (BMI) 22.0-22.9, adult: Secondary | ICD-10-CM | POA: Diagnosis not present

## 2022-04-08 DIAGNOSIS — F19980 Other psychoactive substance use, unspecified with psychoactive substance-induced anxiety disorder: Secondary | ICD-10-CM | POA: Diagnosis not present

## 2022-04-08 DIAGNOSIS — F988 Other specified behavioral and emotional disorders with onset usually occurring in childhood and adolescence: Secondary | ICD-10-CM | POA: Diagnosis not present

## 2022-04-08 DIAGNOSIS — M1990 Unspecified osteoarthritis, unspecified site: Secondary | ICD-10-CM | POA: Diagnosis not present

## 2022-04-08 DIAGNOSIS — F1120 Opioid dependence, uncomplicated: Secondary | ICD-10-CM | POA: Diagnosis not present

## 2022-04-08 DIAGNOSIS — F1920 Other psychoactive substance dependence, uncomplicated: Secondary | ICD-10-CM | POA: Diagnosis not present

## 2022-04-08 MED ORDER — BUPRENORPHINE HCL-NALOXONE HCL 8-2 MG SL FILM
ORAL_FILM | SUBLINGUAL | 0 refills | Status: DC
  Filled 2022-04-08: qty 21, 28d supply, fill #0

## 2022-05-06 ENCOUNTER — Other Ambulatory Visit (HOSPITAL_COMMUNITY): Payer: Self-pay

## 2022-05-06 DIAGNOSIS — Z6822 Body mass index (BMI) 22.0-22.9, adult: Secondary | ICD-10-CM | POA: Diagnosis not present

## 2022-05-06 DIAGNOSIS — F19980 Other psychoactive substance use, unspecified with psychoactive substance-induced anxiety disorder: Secondary | ICD-10-CM | POA: Diagnosis not present

## 2022-05-06 DIAGNOSIS — M1990 Unspecified osteoarthritis, unspecified site: Secondary | ICD-10-CM | POA: Diagnosis not present

## 2022-05-06 DIAGNOSIS — F988 Other specified behavioral and emotional disorders with onset usually occurring in childhood and adolescence: Secondary | ICD-10-CM | POA: Diagnosis not present

## 2022-05-06 DIAGNOSIS — F1920 Other psychoactive substance dependence, uncomplicated: Secondary | ICD-10-CM | POA: Diagnosis not present

## 2022-05-06 DIAGNOSIS — F1120 Opioid dependence, uncomplicated: Secondary | ICD-10-CM | POA: Diagnosis not present

## 2022-05-06 MED ORDER — BUPRENORPHINE HCL-NALOXONE HCL 8-2 MG SL FILM
ORAL_FILM | SUBLINGUAL | 0 refills | Status: DC
  Filled 2022-05-06: qty 21, 28d supply, fill #0

## 2022-06-09 ENCOUNTER — Other Ambulatory Visit (HOSPITAL_COMMUNITY): Payer: Self-pay

## 2022-06-09 DIAGNOSIS — F411 Generalized anxiety disorder: Secondary | ICD-10-CM | POA: Diagnosis not present

## 2022-06-09 DIAGNOSIS — F1120 Opioid dependence, uncomplicated: Secondary | ICD-10-CM | POA: Diagnosis not present

## 2022-06-09 DIAGNOSIS — Z79899 Other long term (current) drug therapy: Secondary | ICD-10-CM | POA: Diagnosis not present

## 2022-06-09 DIAGNOSIS — Z6822 Body mass index (BMI) 22.0-22.9, adult: Secondary | ICD-10-CM | POA: Diagnosis not present

## 2022-06-09 MED ORDER — BUPRENORPHINE HCL-NALOXONE HCL 8-2 MG SL FILM
ORAL_FILM | SUBLINGUAL | 0 refills | Status: DC
  Filled 2022-06-09: qty 28, 28d supply, fill #0

## 2022-06-25 DIAGNOSIS — Z0000 Encounter for general adult medical examination without abnormal findings: Secondary | ICD-10-CM | POA: Diagnosis not present

## 2022-06-25 DIAGNOSIS — Z79899 Other long term (current) drug therapy: Secondary | ICD-10-CM | POA: Diagnosis not present

## 2022-06-30 ENCOUNTER — Other Ambulatory Visit: Payer: Self-pay | Admitting: Internal Medicine

## 2022-06-30 DIAGNOSIS — R9089 Other abnormal findings on diagnostic imaging of central nervous system: Secondary | ICD-10-CM

## 2022-07-07 ENCOUNTER — Other Ambulatory Visit (HOSPITAL_COMMUNITY): Payer: Self-pay

## 2022-07-07 DIAGNOSIS — Z6822 Body mass index (BMI) 22.0-22.9, adult: Secondary | ICD-10-CM | POA: Diagnosis not present

## 2022-07-07 DIAGNOSIS — F411 Generalized anxiety disorder: Secondary | ICD-10-CM | POA: Diagnosis not present

## 2022-07-07 DIAGNOSIS — F1120 Opioid dependence, uncomplicated: Secondary | ICD-10-CM | POA: Diagnosis not present

## 2022-07-07 MED ORDER — BUPRENORPHINE HCL-NALOXONE HCL 8-2 MG SL FILM
ORAL_FILM | SUBLINGUAL | 0 refills | Status: DC
  Filled 2022-07-07: qty 28, 28d supply, fill #0

## 2022-08-04 ENCOUNTER — Other Ambulatory Visit (HOSPITAL_COMMUNITY): Payer: Self-pay

## 2022-08-04 DIAGNOSIS — Z79899 Other long term (current) drug therapy: Secondary | ICD-10-CM | POA: Diagnosis not present

## 2022-08-04 DIAGNOSIS — F1120 Opioid dependence, uncomplicated: Secondary | ICD-10-CM | POA: Diagnosis not present

## 2022-08-04 DIAGNOSIS — Z6822 Body mass index (BMI) 22.0-22.9, adult: Secondary | ICD-10-CM | POA: Diagnosis not present

## 2022-08-04 DIAGNOSIS — F411 Generalized anxiety disorder: Secondary | ICD-10-CM | POA: Diagnosis not present

## 2022-08-04 MED ORDER — BUPRENORPHINE HCL-NALOXONE HCL 8-2 MG SL FILM
1.0000 | ORAL_FILM | Freq: Every day | SUBLINGUAL | 0 refills | Status: DC
  Filled 2022-08-04: qty 28, 28d supply, fill #0

## 2022-09-01 ENCOUNTER — Other Ambulatory Visit (HOSPITAL_COMMUNITY): Payer: Self-pay

## 2022-09-01 DIAGNOSIS — Z6822 Body mass index (BMI) 22.0-22.9, adult: Secondary | ICD-10-CM | POA: Diagnosis not present

## 2022-09-01 DIAGNOSIS — F1120 Opioid dependence, uncomplicated: Secondary | ICD-10-CM | POA: Diagnosis not present

## 2022-09-01 DIAGNOSIS — F411 Generalized anxiety disorder: Secondary | ICD-10-CM | POA: Diagnosis not present

## 2022-09-01 MED ORDER — BUPRENORPHINE HCL-NALOXONE HCL 8-2 MG SL FILM
1.0000 | ORAL_FILM | Freq: Every day | SUBLINGUAL | 0 refills | Status: DC
  Filled 2022-09-01: qty 28, 28d supply, fill #0

## 2022-09-10 DIAGNOSIS — H5201 Hypermetropia, right eye: Secondary | ICD-10-CM | POA: Diagnosis not present

## 2022-09-10 DIAGNOSIS — H52223 Regular astigmatism, bilateral: Secondary | ICD-10-CM | POA: Diagnosis not present

## 2022-09-29 ENCOUNTER — Other Ambulatory Visit (HOSPITAL_COMMUNITY): Payer: Self-pay

## 2022-09-29 DIAGNOSIS — F1120 Opioid dependence, uncomplicated: Secondary | ICD-10-CM | POA: Diagnosis not present

## 2022-09-29 DIAGNOSIS — Z6822 Body mass index (BMI) 22.0-22.9, adult: Secondary | ICD-10-CM | POA: Diagnosis not present

## 2022-09-29 DIAGNOSIS — Z79899 Other long term (current) drug therapy: Secondary | ICD-10-CM | POA: Diagnosis not present

## 2022-09-29 DIAGNOSIS — F411 Generalized anxiety disorder: Secondary | ICD-10-CM | POA: Diagnosis not present

## 2022-09-29 MED ORDER — BUPRENORPHINE HCL-NALOXONE HCL 8-2 MG SL FILM
ORAL_FILM | SUBLINGUAL | 0 refills | Status: DC
  Filled 2022-09-29: qty 28, 28d supply, fill #0

## 2022-10-27 ENCOUNTER — Other Ambulatory Visit (HOSPITAL_COMMUNITY): Payer: Self-pay

## 2022-10-27 DIAGNOSIS — Z6822 Body mass index (BMI) 22.0-22.9, adult: Secondary | ICD-10-CM | POA: Diagnosis not present

## 2022-10-27 DIAGNOSIS — F411 Generalized anxiety disorder: Secondary | ICD-10-CM | POA: Diagnosis not present

## 2022-10-27 DIAGNOSIS — F1120 Opioid dependence, uncomplicated: Secondary | ICD-10-CM | POA: Diagnosis not present

## 2022-10-27 MED ORDER — BUPRENORPHINE HCL-NALOXONE HCL 8-2 MG SL FILM
1.0000 | ORAL_FILM | Freq: Every day | SUBLINGUAL | 0 refills | Status: DC
  Filled 2022-10-27: qty 28, 28d supply, fill #0

## 2022-11-24 ENCOUNTER — Other Ambulatory Visit (HOSPITAL_COMMUNITY): Payer: Self-pay

## 2022-11-24 DIAGNOSIS — F411 Generalized anxiety disorder: Secondary | ICD-10-CM | POA: Diagnosis not present

## 2022-11-24 DIAGNOSIS — Z6822 Body mass index (BMI) 22.0-22.9, adult: Secondary | ICD-10-CM | POA: Diagnosis not present

## 2022-11-24 DIAGNOSIS — Z79899 Other long term (current) drug therapy: Secondary | ICD-10-CM | POA: Diagnosis not present

## 2022-11-24 DIAGNOSIS — F1120 Opioid dependence, uncomplicated: Secondary | ICD-10-CM | POA: Diagnosis not present

## 2022-11-24 MED ORDER — BUPRENORPHINE HCL-NALOXONE HCL 8-2 MG SL FILM
ORAL_FILM | SUBLINGUAL | 0 refills | Status: DC
  Filled 2022-11-24: qty 28, 28d supply, fill #0

## 2022-12-22 ENCOUNTER — Other Ambulatory Visit (HOSPITAL_COMMUNITY): Payer: Self-pay

## 2022-12-22 DIAGNOSIS — F1120 Opioid dependence, uncomplicated: Secondary | ICD-10-CM | POA: Diagnosis not present

## 2022-12-22 DIAGNOSIS — F411 Generalized anxiety disorder: Secondary | ICD-10-CM | POA: Diagnosis not present

## 2022-12-22 MED ORDER — BUPRENORPHINE HCL-NALOXONE HCL 8-2 MG SL FILM
1.0000 | ORAL_FILM | Freq: Every day | SUBLINGUAL | 0 refills | Status: DC
  Filled 2022-12-22: qty 28, 28d supply, fill #0

## 2023-01-19 ENCOUNTER — Other Ambulatory Visit (HOSPITAL_COMMUNITY): Payer: Self-pay

## 2023-01-19 DIAGNOSIS — Z6822 Body mass index (BMI) 22.0-22.9, adult: Secondary | ICD-10-CM | POA: Diagnosis not present

## 2023-01-19 DIAGNOSIS — F1120 Opioid dependence, uncomplicated: Secondary | ICD-10-CM | POA: Diagnosis not present

## 2023-01-19 DIAGNOSIS — Z79899 Other long term (current) drug therapy: Secondary | ICD-10-CM | POA: Diagnosis not present

## 2023-01-19 DIAGNOSIS — F411 Generalized anxiety disorder: Secondary | ICD-10-CM | POA: Diagnosis not present

## 2023-01-19 MED ORDER — BUPRENORPHINE HCL-NALOXONE HCL 8-2 MG SL FILM
1.0000 | ORAL_FILM | Freq: Every day | SUBLINGUAL | 0 refills | Status: DC
  Filled 2023-01-19: qty 28, 28d supply, fill #0

## 2023-02-16 ENCOUNTER — Other Ambulatory Visit (HOSPITAL_COMMUNITY): Payer: Self-pay

## 2023-02-16 DIAGNOSIS — F411 Generalized anxiety disorder: Secondary | ICD-10-CM | POA: Diagnosis not present

## 2023-02-16 DIAGNOSIS — F1120 Opioid dependence, uncomplicated: Secondary | ICD-10-CM | POA: Diagnosis not present

## 2023-02-16 MED ORDER — BUPRENORPHINE HCL-NALOXONE HCL 8-2 MG SL FILM
ORAL_FILM | SUBLINGUAL | 0 refills | Status: DC
  Filled 2023-02-16: qty 28, 28d supply, fill #0

## 2023-03-16 ENCOUNTER — Other Ambulatory Visit (HOSPITAL_COMMUNITY): Payer: Self-pay

## 2023-03-16 DIAGNOSIS — Z6822 Body mass index (BMI) 22.0-22.9, adult: Secondary | ICD-10-CM | POA: Diagnosis not present

## 2023-03-16 DIAGNOSIS — F411 Generalized anxiety disorder: Secondary | ICD-10-CM | POA: Diagnosis not present

## 2023-03-16 DIAGNOSIS — F1120 Opioid dependence, uncomplicated: Secondary | ICD-10-CM | POA: Diagnosis not present

## 2023-03-16 DIAGNOSIS — Z79899 Other long term (current) drug therapy: Secondary | ICD-10-CM | POA: Diagnosis not present

## 2023-03-16 MED ORDER — BUPRENORPHINE HCL-NALOXONE HCL 8-2 MG SL FILM
1.0000 | ORAL_FILM | Freq: Every day | SUBLINGUAL | 0 refills | Status: DC
  Filled 2023-03-16: qty 28, 28d supply, fill #0

## 2023-04-06 ENCOUNTER — Encounter: Payer: Self-pay | Admitting: Internal Medicine

## 2023-04-13 ENCOUNTER — Other Ambulatory Visit (HOSPITAL_COMMUNITY): Payer: Self-pay

## 2023-04-13 DIAGNOSIS — F1120 Opioid dependence, uncomplicated: Secondary | ICD-10-CM | POA: Diagnosis not present

## 2023-04-13 DIAGNOSIS — F411 Generalized anxiety disorder: Secondary | ICD-10-CM | POA: Diagnosis not present

## 2023-04-13 MED ORDER — BUPRENORPHINE HCL-NALOXONE HCL 8-2 MG SL FILM
1.0000 | ORAL_FILM | Freq: Every day | SUBLINGUAL | 0 refills | Status: DC
  Filled 2023-04-13: qty 28, 28d supply, fill #0

## 2023-05-07 ENCOUNTER — Other Ambulatory Visit (HOSPITAL_COMMUNITY): Payer: Self-pay

## 2023-05-07 DIAGNOSIS — F411 Generalized anxiety disorder: Secondary | ICD-10-CM | POA: Diagnosis not present

## 2023-05-07 DIAGNOSIS — F1120 Opioid dependence, uncomplicated: Secondary | ICD-10-CM | POA: Diagnosis not present

## 2023-05-07 MED ORDER — BUPRENORPHINE HCL-NALOXONE HCL 8-2 MG SL FILM
1.0000 | ORAL_FILM | Freq: Every day | SUBLINGUAL | 0 refills | Status: DC
  Filled 2023-05-10: qty 28, 28d supply, fill #0

## 2023-05-10 ENCOUNTER — Other Ambulatory Visit (HOSPITAL_COMMUNITY): Payer: Self-pay

## 2023-06-04 ENCOUNTER — Other Ambulatory Visit (HOSPITAL_COMMUNITY): Payer: Self-pay

## 2023-06-04 DIAGNOSIS — Z79899 Other long term (current) drug therapy: Secondary | ICD-10-CM | POA: Diagnosis not present

## 2023-06-04 DIAGNOSIS — F1120 Opioid dependence, uncomplicated: Secondary | ICD-10-CM | POA: Diagnosis not present

## 2023-06-04 DIAGNOSIS — F411 Generalized anxiety disorder: Secondary | ICD-10-CM | POA: Diagnosis not present

## 2023-06-04 DIAGNOSIS — Z6822 Body mass index (BMI) 22.0-22.9, adult: Secondary | ICD-10-CM | POA: Diagnosis not present

## 2023-06-04 MED ORDER — BUPRENORPHINE HCL-NALOXONE HCL 8-2 MG SL FILM
ORAL_FILM | SUBLINGUAL | 0 refills | Status: DC
  Filled 2023-06-07: qty 28, 28d supply, fill #0

## 2023-06-07 ENCOUNTER — Other Ambulatory Visit (HOSPITAL_COMMUNITY): Payer: Self-pay

## 2023-07-02 ENCOUNTER — Other Ambulatory Visit (HOSPITAL_COMMUNITY): Payer: Self-pay

## 2023-07-02 DIAGNOSIS — F1120 Opioid dependence, uncomplicated: Secondary | ICD-10-CM | POA: Diagnosis not present

## 2023-07-02 DIAGNOSIS — F411 Generalized anxiety disorder: Secondary | ICD-10-CM | POA: Diagnosis not present

## 2023-07-02 MED ORDER — BUPRENORPHINE HCL-NALOXONE HCL 8-2 MG SL FILM
8.0000 mg | ORAL_FILM | Freq: Every day | SUBLINGUAL | 0 refills | Status: DC
  Filled 2023-07-02 – 2023-07-03 (×2): qty 28, 28d supply, fill #0

## 2023-07-03 ENCOUNTER — Other Ambulatory Visit (HOSPITAL_COMMUNITY): Payer: Self-pay

## 2023-07-05 ENCOUNTER — Other Ambulatory Visit (HOSPITAL_COMMUNITY): Payer: Self-pay

## 2023-08-06 ENCOUNTER — Other Ambulatory Visit (HOSPITAL_COMMUNITY): Payer: Self-pay

## 2023-08-06 DIAGNOSIS — F1120 Opioid dependence, uncomplicated: Secondary | ICD-10-CM | POA: Diagnosis not present

## 2023-08-06 DIAGNOSIS — Z6822 Body mass index (BMI) 22.0-22.9, adult: Secondary | ICD-10-CM | POA: Diagnosis not present

## 2023-08-06 DIAGNOSIS — F411 Generalized anxiety disorder: Secondary | ICD-10-CM | POA: Diagnosis not present

## 2023-08-06 MED ORDER — BUPRENORPHINE HCL-NALOXONE HCL 8-2 MG SL FILM
1.0000 | ORAL_FILM | Freq: Every day | SUBLINGUAL | 0 refills | Status: DC
  Filled 2023-08-06: qty 28, 28d supply, fill #0

## 2023-09-02 ENCOUNTER — Other Ambulatory Visit (HOSPITAL_COMMUNITY): Payer: Self-pay

## 2023-09-02 DIAGNOSIS — F1120 Opioid dependence, uncomplicated: Secondary | ICD-10-CM | POA: Diagnosis not present

## 2023-09-02 DIAGNOSIS — F411 Generalized anxiety disorder: Secondary | ICD-10-CM | POA: Diagnosis not present

## 2023-09-02 MED ORDER — BUPRENORPHINE HCL-NALOXONE HCL 8-2 MG SL FILM
1.0000 | ORAL_FILM | Freq: Every day | SUBLINGUAL | 0 refills | Status: DC
  Filled 2023-09-02: qty 11, 11d supply, fill #0
  Filled 2023-09-02 (×2): qty 17, 17d supply, fill #0
  Filled 2023-09-02: qty 11, 11d supply, fill #0

## 2023-09-05 ENCOUNTER — Telehealth: Payer: 59 | Admitting: Nurse Practitioner

## 2023-09-05 DIAGNOSIS — J0110 Acute frontal sinusitis, unspecified: Secondary | ICD-10-CM

## 2023-09-05 MED ORDER — FLUTICASONE PROPIONATE 50 MCG/ACT NA SUSP: 2.0000 | Freq: Every day | NASAL | 0 refills | Status: AC

## 2023-09-05 MED ORDER — AMOXICILLIN-POT CLAVULANATE 875-125 MG PO TABS: 1.0000 | ORAL_TABLET | Freq: Two times a day (BID) | ORAL | 0 refills | Status: AC

## 2023-09-05 NOTE — Progress Notes (Signed)
I have spent 5 minutes in review of e-visit questionnaire, review and updating patient chart, medical decision making and response to patient.  ° °Jerrell Mangel W Secilia Apps, NP ° °  °

## 2023-09-05 NOTE — Progress Notes (Signed)
E-Visit for Sinus Problems  We are sorry that you are not feeling well.  Here is how we plan to help!  Based on what you have shared with me it looks like you have sinusitis.  Sinusitis is inflammation and infection in the sinus cavities of the head.  Based on your presentation I believe you most likely have Acute Bacterial Sinusitis.  This is an infection caused by bacteria and is treated with antibiotics. I have prescribed Augmentin 875mg /125mg  one tablet twice daily with food, for 7 days. I also sent a nose spray for congestion and ear pain.  You may use an oral decongestant such as Mucinex D or if you have glaucoma or high blood pressure use plain Mucinex. Saline nasal spray help and can safely be used as often as needed for congestion.  If you develop worsening sinus pain, fever or notice severe headache and vision changes, or if symptoms are not better after completion of antibiotic, please schedule an appointment with a health care provider.    Sinus infections are not as easily transmitted as other respiratory infection, however we still recommend that you avoid close contact with loved ones, especially the very young and elderly.  Remember to wash your hands thoroughly throughout the day as this is the number one way to prevent the spread of infection!  Home Care: Only take medications as instructed by your medical team. Complete the entire course of an antibiotic. Do not take these medications with alcohol. A steam or ultrasonic humidifier can help congestion.  You can place a towel over your head and breathe in the steam from hot water coming from a faucet. Avoid close contacts especially the very young and the elderly. Cover your mouth when you cough or sneeze. Always remember to wash your hands.  Get Help Right Away If: You develop worsening fever or sinus pain. You develop a severe head ache or visual changes. Your symptoms persist after you have completed your treatment  plan.  Make sure you Understand these instructions. Will watch your condition. Will get help right away if you are not doing well or get worse.  Thank you for choosing an e-visit.  Your e-visit answers were reviewed by a board certified advanced clinical practitioner to complete your personal care plan. Depending upon the condition, your plan could have included both over the counter or prescription medications.  Please review your pharmacy choice. Make sure the pharmacy is open so you can pick up prescription now. If there is a problem, you may contact your provider through [employer] and have the prescription routed to another pharmacy.  Your safety is important to [place]. If you have drug allergies check your prescription carefully.   For the next 24 hours you can use MyChart to ask questions about today's visit, request a non-urgent call back, or ask for a work or school excuse. You will get an email in the next two days asking about your experience. I hope that your e-visit has been valuable and will speed your recovery.

## 2023-09-30 ENCOUNTER — Other Ambulatory Visit (HOSPITAL_COMMUNITY): Payer: Self-pay

## 2023-09-30 DIAGNOSIS — F411 Generalized anxiety disorder: Secondary | ICD-10-CM | POA: Diagnosis not present

## 2023-09-30 DIAGNOSIS — Z79899 Other long term (current) drug therapy: Secondary | ICD-10-CM | POA: Diagnosis not present

## 2023-09-30 DIAGNOSIS — F1120 Opioid dependence, uncomplicated: Secondary | ICD-10-CM | POA: Diagnosis not present

## 2023-09-30 DIAGNOSIS — Z6823 Body mass index (BMI) 23.0-23.9, adult: Secondary | ICD-10-CM | POA: Diagnosis not present

## 2023-09-30 MED ORDER — BUPRENORPHINE HCL-NALOXONE HCL 8-2 MG SL FILM
1.0000 | ORAL_FILM | Freq: Every day | SUBLINGUAL | 0 refills | Status: DC
  Filled 2023-09-30: qty 28, 28d supply, fill #0

## 2023-10-28 ENCOUNTER — Other Ambulatory Visit (HOSPITAL_COMMUNITY): Payer: Self-pay

## 2023-10-28 DIAGNOSIS — F411 Generalized anxiety disorder: Secondary | ICD-10-CM | POA: Diagnosis not present

## 2023-10-28 DIAGNOSIS — F1120 Opioid dependence, uncomplicated: Secondary | ICD-10-CM | POA: Diagnosis not present

## 2023-10-28 MED ORDER — BUPRENORPHINE HCL-NALOXONE HCL 8-2 MG SL FILM
1.0000 | ORAL_FILM | Freq: Every day | SUBLINGUAL | 0 refills | Status: DC
  Filled 2023-10-28: qty 28, 28d supply, fill #0

## 2023-11-25 ENCOUNTER — Other Ambulatory Visit (HOSPITAL_COMMUNITY): Payer: Self-pay

## 2023-11-25 DIAGNOSIS — F1120 Opioid dependence, uncomplicated: Secondary | ICD-10-CM | POA: Diagnosis not present

## 2023-11-25 DIAGNOSIS — Z1331 Encounter for screening for depression: Secondary | ICD-10-CM | POA: Diagnosis not present

## 2023-11-25 DIAGNOSIS — Z6823 Body mass index (BMI) 23.0-23.9, adult: Secondary | ICD-10-CM | POA: Diagnosis not present

## 2023-11-25 DIAGNOSIS — F411 Generalized anxiety disorder: Secondary | ICD-10-CM | POA: Diagnosis not present

## 2023-11-25 DIAGNOSIS — Z79899 Other long term (current) drug therapy: Secondary | ICD-10-CM | POA: Diagnosis not present

## 2023-11-25 MED ORDER — BUPRENORPHINE HCL-NALOXONE HCL 8-2 MG SL FILM
1.0000 | ORAL_FILM | Freq: Every day | SUBLINGUAL | 0 refills | Status: DC
  Filled 2023-11-25: qty 28, 28d supply, fill #0

## 2023-12-23 ENCOUNTER — Other Ambulatory Visit (HOSPITAL_COMMUNITY): Payer: Self-pay

## 2023-12-23 DIAGNOSIS — F1120 Opioid dependence, uncomplicated: Secondary | ICD-10-CM | POA: Diagnosis not present

## 2023-12-23 DIAGNOSIS — F411 Generalized anxiety disorder: Secondary | ICD-10-CM | POA: Diagnosis not present

## 2023-12-23 MED ORDER — BUPRENORPHINE HCL-NALOXONE HCL 8-2 MG SL FILM
ORAL_FILM | Freq: Every day | SUBLINGUAL | 0 refills | Status: DC
  Filled 2023-12-23: qty 28, 28d supply, fill #0

## 2024-01-21 ENCOUNTER — Other Ambulatory Visit (HOSPITAL_COMMUNITY): Payer: Self-pay

## 2024-01-21 DIAGNOSIS — Z6823 Body mass index (BMI) 23.0-23.9, adult: Secondary | ICD-10-CM | POA: Diagnosis not present

## 2024-01-21 DIAGNOSIS — F1120 Opioid dependence, uncomplicated: Secondary | ICD-10-CM | POA: Diagnosis not present

## 2024-01-21 DIAGNOSIS — Z79899 Other long term (current) drug therapy: Secondary | ICD-10-CM | POA: Diagnosis not present

## 2024-01-21 DIAGNOSIS — F411 Generalized anxiety disorder: Secondary | ICD-10-CM | POA: Diagnosis not present

## 2024-01-21 MED ORDER — BUPRENORPHINE HCL-NALOXONE HCL 8-2 MG SL FILM
1.0000 | ORAL_FILM | Freq: Every day | SUBLINGUAL | 1 refills | Status: DC
  Filled 2024-01-21: qty 28, 28d supply, fill #0
  Filled 2024-02-17: qty 28, 28d supply, fill #1

## 2024-02-17 ENCOUNTER — Other Ambulatory Visit (HOSPITAL_COMMUNITY): Payer: Self-pay

## 2024-03-15 ENCOUNTER — Other Ambulatory Visit (HOSPITAL_COMMUNITY): Payer: Self-pay

## 2024-03-15 DIAGNOSIS — F411 Generalized anxiety disorder: Secondary | ICD-10-CM | POA: Diagnosis not present

## 2024-03-15 DIAGNOSIS — F1120 Opioid dependence, uncomplicated: Secondary | ICD-10-CM | POA: Diagnosis not present

## 2024-03-15 MED ORDER — BUPRENORPHINE HCL-NALOXONE HCL 8-2 MG SL FILM
1.0000 | ORAL_FILM | SUBLINGUAL | 0 refills | Status: DC
  Filled 2024-03-15: qty 28, 28d supply, fill #0

## 2024-04-12 ENCOUNTER — Other Ambulatory Visit (HOSPITAL_BASED_OUTPATIENT_CLINIC_OR_DEPARTMENT_OTHER): Payer: Self-pay

## 2024-04-12 ENCOUNTER — Other Ambulatory Visit (HOSPITAL_COMMUNITY): Payer: Self-pay

## 2024-04-12 DIAGNOSIS — F1120 Opioid dependence, uncomplicated: Secondary | ICD-10-CM | POA: Diagnosis not present

## 2024-04-12 DIAGNOSIS — F411 Generalized anxiety disorder: Secondary | ICD-10-CM | POA: Diagnosis not present

## 2024-04-12 DIAGNOSIS — Z6823 Body mass index (BMI) 23.0-23.9, adult: Secondary | ICD-10-CM | POA: Diagnosis not present

## 2024-04-12 DIAGNOSIS — Z79899 Other long term (current) drug therapy: Secondary | ICD-10-CM | POA: Diagnosis not present

## 2024-04-12 MED ORDER — BUPRENORPHINE HCL-NALOXONE HCL 8-2 MG SL FILM: 1.0000 | ORAL_FILM | Freq: Every day | SUBLINGUAL | 0 refills | Status: DC

## 2024-04-12 MED ORDER — BUPRENORPHINE HCL-NALOXONE HCL 8-2 MG SL FILM
1.0000 | ORAL_FILM | Freq: Every day | SUBLINGUAL | 0 refills | Status: DC
  Filled 2024-04-12 (×2): qty 28, 28d supply, fill #0

## 2024-05-10 ENCOUNTER — Other Ambulatory Visit (HOSPITAL_BASED_OUTPATIENT_CLINIC_OR_DEPARTMENT_OTHER): Payer: Self-pay

## 2024-05-10 DIAGNOSIS — F411 Generalized anxiety disorder: Secondary | ICD-10-CM | POA: Diagnosis not present

## 2024-05-10 DIAGNOSIS — F1120 Opioid dependence, uncomplicated: Secondary | ICD-10-CM | POA: Diagnosis not present

## 2024-05-10 MED ORDER — BUPRENORPHINE HCL-NALOXONE HCL 8-2 MG SL FILM
1.0000 | ORAL_FILM | Freq: Every day | SUBLINGUAL | 0 refills | Status: DC
  Filled 2024-05-10: qty 28, 28d supply, fill #0

## 2024-05-11 ENCOUNTER — Other Ambulatory Visit (HOSPITAL_BASED_OUTPATIENT_CLINIC_OR_DEPARTMENT_OTHER): Payer: Self-pay

## 2024-06-07 ENCOUNTER — Other Ambulatory Visit (HOSPITAL_BASED_OUTPATIENT_CLINIC_OR_DEPARTMENT_OTHER): Payer: Self-pay

## 2024-06-07 DIAGNOSIS — Z79899 Other long term (current) drug therapy: Secondary | ICD-10-CM | POA: Diagnosis not present

## 2024-06-07 DIAGNOSIS — Z6823 Body mass index (BMI) 23.0-23.9, adult: Secondary | ICD-10-CM | POA: Diagnosis not present

## 2024-06-07 DIAGNOSIS — F1120 Opioid dependence, uncomplicated: Secondary | ICD-10-CM | POA: Diagnosis not present

## 2024-06-07 DIAGNOSIS — F411 Generalized anxiety disorder: Secondary | ICD-10-CM | POA: Diagnosis not present

## 2024-06-07 MED ORDER — BUPRENORPHINE HCL-NALOXONE HCL 8-2 MG SL FILM
1.0000 | ORAL_FILM | Freq: Every day | SUBLINGUAL | 0 refills | Status: DC
  Filled 2024-06-07: qty 28, 28d supply, fill #0

## 2024-06-21 ENCOUNTER — Ambulatory Visit: Admitting: Internal Medicine

## 2024-06-21 ENCOUNTER — Encounter: Payer: Self-pay | Admitting: Internal Medicine

## 2024-06-21 VITALS — BP 116/76 | HR 66 | Temp 98.3°F | Resp 16 | Ht 71.0 in | Wt 167.8 lb

## 2024-06-21 DIAGNOSIS — Z125 Encounter for screening for malignant neoplasm of prostate: Secondary | ICD-10-CM | POA: Diagnosis not present

## 2024-06-21 DIAGNOSIS — R9089 Other abnormal findings on diagnostic imaging of central nervous system: Secondary | ICD-10-CM

## 2024-06-21 DIAGNOSIS — G4489 Other headache syndrome: Secondary | ICD-10-CM | POA: Diagnosis not present

## 2024-06-21 DIAGNOSIS — Z0000 Encounter for general adult medical examination without abnormal findings: Secondary | ICD-10-CM

## 2024-06-21 DIAGNOSIS — K5903 Drug induced constipation: Secondary | ICD-10-CM

## 2024-06-21 DIAGNOSIS — R5382 Chronic fatigue, unspecified: Secondary | ICD-10-CM | POA: Diagnosis not present

## 2024-06-21 DIAGNOSIS — E291 Testicular hypofunction: Secondary | ICD-10-CM | POA: Diagnosis not present

## 2024-06-21 DIAGNOSIS — Z23 Encounter for immunization: Secondary | ICD-10-CM | POA: Diagnosis not present

## 2024-06-21 DIAGNOSIS — R6882 Decreased libido: Secondary | ICD-10-CM | POA: Diagnosis not present

## 2024-06-21 DIAGNOSIS — Z1159 Encounter for screening for other viral diseases: Secondary | ICD-10-CM | POA: Diagnosis not present

## 2024-06-21 LAB — HEPATIC FUNCTION PANEL
ALT: 17 U/L (ref 0–53)
AST: 19 U/L (ref 0–37)
Albumin: 4.7 g/dL (ref 3.5–5.2)
Alkaline Phosphatase: 72 U/L (ref 39–117)
Bilirubin, Direct: 0.1 mg/dL (ref 0.0–0.3)
Total Bilirubin: 0.5 mg/dL (ref 0.2–1.2)
Total Protein: 6.9 g/dL (ref 6.0–8.3)

## 2024-06-21 LAB — TSH: TSH: 1.16 u[IU]/mL (ref 0.35–5.50)

## 2024-06-21 LAB — CBC WITH DIFFERENTIAL/PLATELET
Basophils Absolute: 0.1 K/uL (ref 0.0–0.1)
Basophils Relative: 1.4 % (ref 0.0–3.0)
Eosinophils Absolute: 0.6 K/uL (ref 0.0–0.7)
Eosinophils Relative: 13.7 % — ABNORMAL HIGH (ref 0.0–5.0)
HCT: 42.9 % (ref 39.0–52.0)
Hemoglobin: 14.3 g/dL (ref 13.0–17.0)
Lymphocytes Relative: 37.5 % (ref 12.0–46.0)
Lymphs Abs: 1.6 K/uL (ref 0.7–4.0)
MCHC: 33.2 g/dL (ref 30.0–36.0)
MCV: 90.5 fl (ref 78.0–100.0)
Monocytes Absolute: 0.4 K/uL (ref 0.1–1.0)
Monocytes Relative: 9.4 % (ref 3.0–12.0)
Neutro Abs: 1.6 K/uL (ref 1.4–7.7)
Neutrophils Relative %: 38 % — ABNORMAL LOW (ref 43.0–77.0)
Platelets: 259 K/uL (ref 150.0–400.0)
RBC: 4.74 Mil/uL (ref 4.22–5.81)
RDW: 13 % (ref 11.5–15.5)
WBC: 4.1 K/uL (ref 4.0–10.5)

## 2024-06-21 LAB — LIPID PANEL
Cholesterol: 196 mg/dL (ref 0–200)
HDL: 54.5 mg/dL (ref >39.00)
LDL Cholesterol: 108 mg/dL — ABNORMAL HIGH (ref 0–99)
NonHDL: 141.84
Total CHOL/HDL Ratio: 4
Triglycerides: 169 mg/dL — ABNORMAL HIGH (ref 0.0–149.0)
VLDL: 33.8 mg/dL (ref 0.0–40.0)

## 2024-06-21 LAB — BASIC METABOLIC PANEL WITH GFR
BUN: 9 mg/dL (ref 6–23)
CO2: 30 meq/L (ref 19–32)
Calcium: 9.5 mg/dL (ref 8.4–10.5)
Chloride: 102 meq/L (ref 96–112)
Creatinine, Ser: 0.94 mg/dL (ref 0.40–1.50)
GFR: 101.95 mL/min (ref >60.00)
Glucose, Bld: 100 mg/dL — ABNORMAL HIGH (ref 70–99)
Potassium: 4.7 meq/L (ref 3.5–5.1)
Sodium: 139 meq/L (ref 135–145)

## 2024-06-21 LAB — PSA: PSA: 0.49 ng/mL (ref 0.10–4.00)

## 2024-06-21 LAB — LUTEINIZING HORMONE: LH: 3.51 m[IU]/mL (ref 1.50–9.30)

## 2024-06-21 LAB — MAGNESIUM: Magnesium: 2.5 mg/dL (ref 1.5–2.5)

## 2024-06-21 NOTE — Progress Notes (Signed)
 "  Subjective:  Patient ID: Ryan Spencer, male    DOB: [birth year]  Age: 38 y.o. MRN: [id number]  CC: Annual Exam and Headache   HPI Ryan Spencer presents for a CPX and f/up ----  Discussed the use of AI scribe software for clinical note transcription with the patient, who gave verbal consent to proceed.  History of Present Illness Ryan Spencer is a 38 year old male who presents with headaches and fatigue.  He experiences occasional headaches, generally relieved by Tylenol  or Excedrin. About a month ago, he had an intense headache that resolved after taking Tylenol  and resting for thirty minutes. No associated nausea, vomiting, slurred speech, or trouble walking or talking. He notes slight sensitivity to light during headaches.  He reports fatigue and decreased energy levels, stating he doesn't feel as strong as he used to. He also mentions a decrease in libido and erectile dysfunction. He notes a past discussion with a urologist about potential lower testosterone  levels due to previous hernia surgery.  He has noticed weight gain around his midsection, attributing some of this to quitting smoking and vaping. He reports constipation, which he believes is related to his medication.  No numbness, weakness, or tingling. No chest pain, shortness of breath, or trouble sleeping.     Outpatient Medications Prior to Visit  Medication Sig Dispense Refill   Buprenorphine  HCl-Naloxone  HCl 8-2 MG FILM Place 1 Film under the tongue daily. 28 Film 0   fluticasone  (FLONASE ) 50 MCG/ACT nasal spray Place 2 sprays into both nostrils daily. 16 g 0   Buprenorphine  HCl-Naloxone  HCl 8-2 MG FILM Place 1 Film under the tongue daily. 28 Film 0   Buprenorphine  HCl-Naloxone  HCl 8-2 MG FILM      Buprenorphine  HCl-Naloxone  HCl 8-2 MG FILM Place 1 Film under the tongue daily. 28 Film 0   ibuprofen  (ADVIL ) 600 MG tablet Take 1 tablet by mouth every 6 hours as needed. 30 tablet 0   LORazepam  (ATIVAN ) 1 MG  tablet ARRIVE IN OFFICE 30 MINUTES EARLY. DO NOT TAKE MEDS UNTIL YOU ARRIVE AND HAVE BEEN CHECKED IN 2 tablet 0   scopolamine  (TRANSDERM-SCOP) 1 MG/3DAYS Place 1 patch (1.5 mg total) onto the skin every 3 (three) days. 10 patch 2   No facility-administered medications prior to visit.    ROS Review of Systems  Constitutional:  Positive for fatigue and unexpected weight change. Negative for activity change, appetite change, chills and diaphoresis.  HENT:  Negative for trouble swallowing.   Respiratory: Negative.  Negative for cough, chest tightness, shortness of breath and wheezing.   Cardiovascular:  Negative for chest pain, palpitations and leg swelling.  Gastrointestinal:  Positive for constipation. Negative for abdominal pain, diarrhea, nausea and vomiting.  Genitourinary:  Negative for scrotal swelling and testicular pain.  Musculoskeletal: Negative.   Skin: Negative.   Neurological:  Positive for headaches. Negative for dizziness, weakness, light-headedness and numbness.  Hematological:  Negative for adenopathy. Does not bruise/bleed easily.  Psychiatric/Behavioral: Negative.      Objective:  BP 116/76 (BP Location: Left Arm, Patient Position: Sitting, Cuff Size: Normal)   Pulse 66   Temp 98.3 F (36.8 C) (Oral)   Resp 16   Ht 5' 11 (1.803 m)   Wt 167 lb 12.8 oz (76.1 kg)   SpO2 99%   BMI 23.40 kg/m   BP Readings from Last 3 Encounters:  06/21/24 116/76  05/13/20 116/74  08/28/19 116/64    Wt Readings from Last 3 Encounters:  06/21/24 167 lb 12.8 oz (76.1 kg)  05/13/20 159 lb 4 oz (72.2 kg)  08/28/19 162 lb (73.5 kg)    Physical Exam Vitals reviewed.  Constitutional:      Appearance: Normal appearance. He is not ill-appearing.  HENT:     Mouth/Throat:     Mouth: Mucous membranes are moist.  Eyes:     General: No scleral icterus.    Conjunctiva/sclera: Conjunctivae normal.  Cardiovascular:     Rate and Rhythm: Normal rate and regular rhythm.     Heart  sounds: No murmur heard.    No friction rub. No gallop.  Pulmonary:     Effort: Pulmonary effort is normal.     Breath sounds: No stridor. No wheezing, rhonchi or rales.  Abdominal:     General: Abdomen is flat.     Palpations: There is no mass.     Tenderness: There is no abdominal tenderness. There is no guarding.     Hernia: No hernia is present.  Musculoskeletal:        General: Normal range of motion.     Cervical back: Neck supple.     Right lower leg: No edema.     Left lower leg: No edema.  Lymphadenopathy:     Cervical: No cervical adenopathy.  Skin:    General: Skin is warm and dry.  Neurological:     General: No focal deficit present.     Sensory: Sensation is intact.     Motor: Motor function is intact.     Coordination: Coordination is intact.     Gait: Gait is intact.     Deep Tendon Reflexes: Reflexes normal. Babinski sign absent on the right side. Babinski sign absent on the left side.     Reflex Scores:      Tricep reflexes are 1+ on the right side and 1+ on the left side.      Bicep reflexes are 1+ on the right side and 1+ on the left side.      Brachioradialis reflexes are 1+ on the right side and 1+ on the left side.      Patellar reflexes are 1+ on the right side and 1+ on the left side.      Achilles reflexes are 1+ on the right side and 1+ on the left side.    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 Subsequent encounter. EXAM: MRI HEAD WITHOUT AND WITH CONTRAST TECHNIQUE: Multiplanar, multiecho pulse sequences of the brain and surrounding structures were obtained without and with intravenous contrast. CONTRAST:  15mL MULTIHANCE  GADOBENATE DIMEGLUMINE  529 MG/ML IV SOLN COMPARISON:  Brain MRI 10/11/2020, 06/13/2020. FINDINGS: Brain: Biconvex roughly 17 mm area  of asymmetric FLAIR hyperintensity in the anterior left putamen (series 8, image 18) remain stable since last year, and subtle on the non FLAIR sequences. Diffusion is mildly facilitated. There is no regional mass effect. No mineralization. No associated enhancement. Stable and normal cerebral volume. No restricted diffusion to suggest acute infarction. No midline shift, mass effect, ventriculomegaly, extra-axial collection or acute intracranial hemorrhage. Cervicomedullary junction and pituitary are within normal limits. Outside of the left putamen gray and white matter signal remains normal, with no cortical encephalomalacia or chronic cerebral blood products identified. No abnormal enhancement identified. No dural thickening. Vascular: Major intracranial vascular flow voids are stable. And the major dural venous sinuses are enhancing and appear to be patent. Skull and upper cervical spine: Normal visible cervical spine, bone marrow signal. Sinuses/Orbits: Stable, negative. Other: Mastoids are clear. Visible internal auditory structures appear normal. Scalp and face soft tissues appear negative. IMPRESSION: 1. Small 17 mm area of asymmetric FLAIR hyperintensity in the anterior left putamen remains stable since August 2021. No associated enhancement or mass effect, and this remains subtle on non-FLAIR imaging. Noncontrast imaging likely will suffice on subsequent surveillance, unless the lesion is found to enlarge. 2. Otherwise  normal MRI appearance of the brain. Electronically Signed   By: VEAR Hurst M.D.   On: 07/08/2021 10:43    Assessment & Plan:  Need for hepatitis C screening test -     Hepatitis C antibody; Future  Routine general medical examination at a health care facility- Exam completed, labs reviewed, vaccines reviewed and updated, cancer screenings addressed, pt ed material was given.  -     Hepatitis C antibody; Future -     Lipid panel; Future  Drug-induced constipation -     TSH; Future -     Hepatic function panel; Future -     Magnesium; Future -     Basic metabolic panel with GFR; Future  Low libido -     Prolactin; Future -     Luteinizing hormone; Future -     Basic metabolic panel with GFR; Future -     Testosterone  Total,Free,Bio, Males; Future  Chronic fatigue -     Hepatic function panel; Future -     CBC with Differential/Platelet; Future -     Basic metabolic panel with GFR; Future -     Testosterone  Total,Free,Bio, Males; Future  Prostate cancer screening -     PSA; Future  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 -     Testosterone ; Place 5 g onto the skin daily.  Dispense: 450 g; Refill: 1     Follow-up: Return in 6 months (on 12/22/2024), or if symptoms worsen or fail to improve.  Debby Molt, MD "

## 2024-06-21 NOTE — Patient Instructions (Signed)
 You received your first Hepatitis B vaccine today. Please return for the second dose in one month.    Health Maintenance, Male Adopting a healthy lifestyle and getting preventive care are important in promoting health and wellness. Ask your health care provider about: The right schedule for you to have regular tests and exams. Things you can do on your own to prevent diseases and keep yourself healthy. What should I know about diet, weight, and exercise? Eat a healthy diet  Eat a diet that includes plenty of vegetables, fruits, low-fat dairy products, and lean protein. Do not eat a lot of foods that are high in solid fats, added sugars, or sodium. Maintain a healthy weight Body mass index (BMI) is a measurement that can be used to identify possible weight problems. It estimates body fat based on height and weight. Your health care provider can help determine your BMI and help you achieve or maintain a healthy weight. Get regular exercise Get regular exercise. This is one of the most important things you can do for your health. Most adults should: Exercise for at least 150 minutes each week. The exercise should increase your heart rate and make you sweat (moderate-intensity exercise). Do strengthening exercises at least twice a week. This is in addition to the moderate-intensity exercise. Spend less time sitting. Even light physical activity can be beneficial. Watch cholesterol and blood lipids Have your blood tested for lipids and cholesterol at 40 years of age, then have this test every 5 years. You may need to have your cholesterol levels checked more often if: Your lipid or cholesterol levels are high. You are older than 40 years of age. You are at high risk for heart disease. What should I know about cancer screening? Many types of cancers can be detected early and may often be prevented. Depending on your health history and family history, you may need to have cancer screening at  various ages. This may include screening for: Colorectal cancer. Prostate cancer. Skin cancer. Lung cancer. What should I know about heart disease, diabetes, and high blood pressure? Blood pressure and heart disease High blood pressure causes heart disease and increases the risk of stroke. This is more likely to develop in people who have high blood pressure readings or are overweight. Talk with your health care provider about your target blood pressure readings. Have your blood pressure checked: Every 3-5 years if you are 55-60 years of age. Every year if you are 8 years old or older. If you are between the ages of 76 and 58 and are a current or former smoker, ask your health care provider if you should have a one-time screening for abdominal aortic aneurysm (AAA). Diabetes Have regular diabetes screenings. This checks your fasting blood sugar level. Have the screening done: Once every three years after age 76 if you are at a normal weight and have a low risk for diabetes. More often and at a younger age if you are overweight or have a high risk for diabetes. What should I know about preventing infection? Hepatitis B If you have a higher risk for hepatitis B, you should be screened for this virus. Talk with your health care provider to find out if you are at risk for hepatitis B infection. Hepatitis C Blood testing is recommended for: Everyone born from [birth year] through 1967. Anyone with known risk factors for hepatitis C. Sexually transmitted infections (STIs) You should be screened each year for STIs, including gonorrhea and chlamydia, if: You are  sexually active and are younger than 40 years of age. You are older than 40 years of age and your health care provider tells you that you are at risk for this type of infection. Your sexual activity has changed since you were last screened, and you are at increased risk for chlamydia or gonorrhea. Ask your health care provider if you are at  risk. Ask your health care provider about whether you are at high risk for HIV. Your health care provider may recommend a prescription medicine to help prevent HIV infection. If you choose to take medicine to prevent HIV, you should first get tested for HIV. You should then be tested every 3 months for as long as you are taking the medicine. Follow these instructions at home: Alcohol use Do not drink alcohol if your health care provider tells you not to drink. If you drink alcohol: Limit how much you have to 0-2 drinks a day. Know how much alcohol is in your drink. In the [place] one drink equals one 12 oz bottle of beer (355 mL), one 5 oz glass of wine (148 mL), or one 1 oz glass of hard liquor (44 mL). Lifestyle Do not use any products that contain nicotine or tobacco. These products include cigarettes, chewing tobacco, and vaping devices, such as e-cigarettes. If you need help quitting, ask your health care provider. Do not use street drugs. Do not share needles. Ask your health care provider for help if you need support or information about quitting drugs. General instructions Schedule regular health, dental, and eye exams. Stay current with your vaccines. Tell your health care provider if: You often feel depressed. You have ever been abused or do not feel safe at home. Summary Adopting a healthy lifestyle and getting preventive care are important in promoting health and wellness. Follow your health care provider's instructions about healthy diet, exercising, and getting tested or screened for diseases. Follow your health care provider's instructions on monitoring your cholesterol and blood pressure. This information is not intended to replace advice given to you by your health care provider. Make sure you discuss any questions you have with your health care provider. Document Revised: 03/03/2021 Document Reviewed: 03/03/2021 Elsevier Patient Education  2024 [employer].

## 2024-06-22 LAB — TESTOSTERONE TOTAL,FREE,BIO, MALES
Albumin: 4.7 g/dL (ref 3.6–5.1)
Sex Hormone Binding: 36 nmol/L (ref 10–50)
Testosterone, Bioavailable: 68.6 ng/dL — ABNORMAL LOW (ref 110.0–575.0)
Testosterone, Free: 32 pg/mL — ABNORMAL LOW (ref 46.0–224.0)
Testosterone: 276 ng/dL (ref 250–827)

## 2024-06-22 LAB — PROLACTIN: Prolactin: 6.9 ng/mL (ref 2.0–18.0)

## 2024-06-22 LAB — HEPATITIS C ANTIBODY: Hepatitis C Ab: NONREACTIVE

## 2024-06-23 ENCOUNTER — Ambulatory Visit: Payer: Self-pay | Admitting: Internal Medicine

## 2024-06-23 ENCOUNTER — Other Ambulatory Visit (HOSPITAL_BASED_OUTPATIENT_CLINIC_OR_DEPARTMENT_OTHER): Payer: Self-pay

## 2024-06-23 DIAGNOSIS — E291 Testicular hypofunction: Secondary | ICD-10-CM | POA: Insufficient documentation

## 2024-06-23 DIAGNOSIS — Z23 Encounter for immunization: Secondary | ICD-10-CM | POA: Insufficient documentation

## 2024-06-23 MED ORDER — TESTOSTERONE 50 MG/5GM (1%) TD GEL
5.0000 g | Freq: Every day | TRANSDERMAL | 1 refills | Status: AC
  Filled 2024-06-23 – 2024-07-07 (×2): qty 450, 90d supply, fill #0
  Filled 2024-10-20: qty 450, 90d supply, fill #1

## 2024-06-28 ENCOUNTER — Other Ambulatory Visit (HOSPITAL_BASED_OUTPATIENT_CLINIC_OR_DEPARTMENT_OTHER): Payer: Self-pay

## 2024-07-03 ENCOUNTER — Other Ambulatory Visit (HOSPITAL_BASED_OUTPATIENT_CLINIC_OR_DEPARTMENT_OTHER): Payer: Self-pay

## 2024-07-05 ENCOUNTER — Other Ambulatory Visit (HOSPITAL_BASED_OUTPATIENT_CLINIC_OR_DEPARTMENT_OTHER): Payer: Self-pay

## 2024-07-05 DIAGNOSIS — F1120 Opioid dependence, uncomplicated: Secondary | ICD-10-CM | POA: Diagnosis not present

## 2024-07-05 DIAGNOSIS — F411 Generalized anxiety disorder: Secondary | ICD-10-CM | POA: Diagnosis not present

## 2024-07-05 MED ORDER — BUPRENORPHINE HCL-NALOXONE HCL 8-2 MG SL FILM
1.0000 | ORAL_FILM | Freq: Every day | SUBLINGUAL | 0 refills | Status: DC
  Filled 2024-07-05: qty 28, 28d supply, fill #0

## 2024-07-07 ENCOUNTER — Other Ambulatory Visit (HOSPITAL_BASED_OUTPATIENT_CLINIC_OR_DEPARTMENT_OTHER): Payer: Self-pay

## 2024-07-07 ENCOUNTER — Other Ambulatory Visit: Payer: Self-pay | Admitting: Internal Medicine

## 2024-07-10 ENCOUNTER — Encounter (HOSPITAL_BASED_OUTPATIENT_CLINIC_OR_DEPARTMENT_OTHER): Payer: Self-pay

## 2024-07-10 ENCOUNTER — Other Ambulatory Visit (HOSPITAL_BASED_OUTPATIENT_CLINIC_OR_DEPARTMENT_OTHER): Payer: Self-pay

## 2024-07-10 ENCOUNTER — Telehealth: Payer: Self-pay

## 2024-07-10 ENCOUNTER — Other Ambulatory Visit (HOSPITAL_COMMUNITY): Payer: Self-pay

## 2024-07-10 ENCOUNTER — Telehealth: Payer: Self-pay | Admitting: Radiology

## 2024-07-10 NOTE — Telephone Encounter (Signed)
 Copied from CRM #[id number]. Topic: Clinical - Medication Prior Auth >> Jul 10, 2024  9:25 AM [place] MATSU wrote: Reason for CRM: testosterone  (ANDROGEL ) 50 MG/5GM (1%) GEL is needing a prior authorization.   May you please assist.

## 2024-07-10 NOTE — Telephone Encounter (Signed)
 Pa has been started for this patient.

## 2024-07-10 NOTE — Telephone Encounter (Signed)
 Good Morning all you great people! Can somebody please assist me with this PA that's needed?

## 2024-07-10 NOTE — Telephone Encounter (Signed)
 Clinical questions answered and PA submitted.

## 2024-07-10 NOTE — Telephone Encounter (Signed)
 Pharmacy Patient Advocate Encounter   Received notification from Patient Advice Request messages that prior authorization for Testosterone  50mg /5gm (1% gel) is required/requested.   Insurance verification completed.   The patient is insured through [facility] .   Per test claim: PA required; PA started via CoverMyMeds. KEY [id number] . Waiting for clinical questions to populate.

## 2024-07-11 ENCOUNTER — Other Ambulatory Visit (HOSPITAL_BASED_OUTPATIENT_CLINIC_OR_DEPARTMENT_OTHER): Payer: Self-pay

## 2024-07-12 ENCOUNTER — Other Ambulatory Visit (HOSPITAL_BASED_OUTPATIENT_CLINIC_OR_DEPARTMENT_OTHER): Payer: Self-pay

## 2024-07-13 ENCOUNTER — Other Ambulatory Visit (HOSPITAL_BASED_OUTPATIENT_CLINIC_OR_DEPARTMENT_OTHER): Payer: Self-pay

## 2024-07-13 NOTE — Telephone Encounter (Signed)
 Your request has been approved The request has been approved. The authorization is effective from 07/12/2024 to 07/11/2025, as long as the member is enrolled in their current health plan. The request was reviewed and approved by a licensed clinical pharmacist. This request is approved for 10 grams (2 packets) per day. A written notification letter will follow with additional details. Authorization Expiration09/16/2026

## 2024-08-02 ENCOUNTER — Other Ambulatory Visit (HOSPITAL_BASED_OUTPATIENT_CLINIC_OR_DEPARTMENT_OTHER): Payer: Self-pay

## 2024-08-02 DIAGNOSIS — F411 Generalized anxiety disorder: Secondary | ICD-10-CM | POA: Diagnosis not present

## 2024-08-02 DIAGNOSIS — Z79899 Other long term (current) drug therapy: Secondary | ICD-10-CM | POA: Diagnosis not present

## 2024-08-02 DIAGNOSIS — Z6823 Body mass index (BMI) 23.0-23.9, adult: Secondary | ICD-10-CM | POA: Diagnosis not present

## 2024-08-02 DIAGNOSIS — F1120 Opioid dependence, uncomplicated: Secondary | ICD-10-CM | POA: Diagnosis not present

## 2024-08-02 MED ORDER — BUPRENORPHINE HCL-NALOXONE HCL 8-2 MG SL FILM
1.0000 | ORAL_FILM | Freq: Every day | SUBLINGUAL | 0 refills | Status: DC
  Filled 2024-08-02: qty 28, 28d supply, fill #0

## 2024-08-30 ENCOUNTER — Other Ambulatory Visit (HOSPITAL_BASED_OUTPATIENT_CLINIC_OR_DEPARTMENT_OTHER): Payer: Self-pay

## 2024-08-30 DIAGNOSIS — F411 Generalized anxiety disorder: Secondary | ICD-10-CM | POA: Diagnosis not present

## 2024-08-30 DIAGNOSIS — F1120 Opioid dependence, uncomplicated: Secondary | ICD-10-CM | POA: Diagnosis not present

## 2024-08-30 MED ORDER — BUPRENORPHINE HCL-NALOXONE HCL 8-2 MG SL FILM
1.0000 | ORAL_FILM | Freq: Every day | SUBLINGUAL | 0 refills | Status: DC
  Filled 2024-08-30: qty 28, 28d supply, fill #0

## 2024-08-31 ENCOUNTER — Other Ambulatory Visit (HOSPITAL_BASED_OUTPATIENT_CLINIC_OR_DEPARTMENT_OTHER): Payer: Self-pay

## 2024-09-22 ENCOUNTER — Other Ambulatory Visit (HOSPITAL_BASED_OUTPATIENT_CLINIC_OR_DEPARTMENT_OTHER): Payer: Self-pay

## 2024-09-22 DIAGNOSIS — Z79899 Other long term (current) drug therapy: Secondary | ICD-10-CM | POA: Diagnosis not present

## 2024-09-22 DIAGNOSIS — F1120 Opioid dependence, uncomplicated: Secondary | ICD-10-CM | POA: Diagnosis not present

## 2024-09-22 DIAGNOSIS — Z6823 Body mass index (BMI) 23.0-23.9, adult: Secondary | ICD-10-CM | POA: Diagnosis not present

## 2024-09-22 DIAGNOSIS — F411 Generalized anxiety disorder: Secondary | ICD-10-CM | POA: Diagnosis not present

## 2024-09-22 MED ORDER — BUPRENORPHINE HCL-NALOXONE HCL 8-2 MG SL FILM
1.0000 | ORAL_FILM | Freq: Every day | SUBLINGUAL | 0 refills | Status: DC
  Filled 2024-09-22: qty 28, 28d supply, fill #0

## 2024-10-20 ENCOUNTER — Other Ambulatory Visit (HOSPITAL_COMMUNITY): Payer: Self-pay

## 2024-10-20 ENCOUNTER — Other Ambulatory Visit (HOSPITAL_BASED_OUTPATIENT_CLINIC_OR_DEPARTMENT_OTHER): Payer: Self-pay

## 2024-10-20 DIAGNOSIS — F1120 Opioid dependence, uncomplicated: Secondary | ICD-10-CM | POA: Diagnosis not present

## 2024-10-20 DIAGNOSIS — F411 Generalized anxiety disorder: Secondary | ICD-10-CM | POA: Diagnosis not present

## 2024-10-20 MED ORDER — BUPRENORPHINE HCL-NALOXONE HCL 8-2 MG SL FILM
1.0000 | ORAL_FILM | Freq: Every day | SUBLINGUAL | 0 refills | Status: DC
  Filled 2024-10-20: qty 28, 28d supply, fill #0

## 2024-10-23 ENCOUNTER — Other Ambulatory Visit (HOSPITAL_BASED_OUTPATIENT_CLINIC_OR_DEPARTMENT_OTHER): Payer: Self-pay

## 2024-11-17 ENCOUNTER — Other Ambulatory Visit (HOSPITAL_BASED_OUTPATIENT_CLINIC_OR_DEPARTMENT_OTHER): Payer: Self-pay

## 2024-11-17 MED ORDER — BUPRENORPHINE HCL-NALOXONE HCL 8-2 MG SL FILM
1.0000 | ORAL_FILM | Freq: Every day | SUBLINGUAL | 0 refills | Status: AC
  Filled 2024-11-17: qty 13, 13d supply, fill #0
  Filled 2024-11-17: qty 15, 15d supply, fill #0

## 2024-12-21 ENCOUNTER — Other Ambulatory Visit (HOSPITAL_BASED_OUTPATIENT_CLINIC_OR_DEPARTMENT_OTHER): Admitting: Radiology
